# Patient Record
Sex: Female | Born: 1965 | Race: White | Hispanic: No | Smoking: Current some day smoker
Health system: Southern US, Community
[De-identification: ages and names within clinical notes are randomized; demographics above are authoritative.]

## PROBLEM LIST (undated history)

## (undated) DIAGNOSIS — T148XXA Other injury of unspecified body region, initial encounter: Secondary | ICD-10-CM

## (undated) DIAGNOSIS — M549 Dorsalgia, unspecified: Secondary | ICD-10-CM

## (undated) DIAGNOSIS — M1990 Unspecified osteoarthritis, unspecified site: Secondary | ICD-10-CM

## (undated) DIAGNOSIS — F419 Anxiety disorder, unspecified: Secondary | ICD-10-CM

## (undated) DIAGNOSIS — F988 Other specified behavioral and emotional disorders with onset usually occurring in childhood and adolescence: Secondary | ICD-10-CM

## (undated) HISTORY — PX: KNEE SURGERY: SHX244

---

## 2004-02-09 ENCOUNTER — Emergency Department (HOSPITAL_COMMUNITY): Admission: EM | Admit: 2004-02-09 | Discharge: 2004-02-09 | Payer: Self-pay | Admitting: Emergency Medicine

## 2004-04-20 ENCOUNTER — Emergency Department (HOSPITAL_COMMUNITY): Admission: AC | Admit: 2004-04-20 | Discharge: 2004-04-21 | Payer: Self-pay

## 2005-10-29 ENCOUNTER — Emergency Department (HOSPITAL_COMMUNITY): Admission: EM | Admit: 2005-10-29 | Discharge: 2005-10-29 | Payer: Self-pay | Admitting: Family Medicine

## 2007-06-06 ENCOUNTER — Emergency Department (HOSPITAL_COMMUNITY): Admission: EM | Admit: 2007-06-06 | Discharge: 2007-06-06 | Payer: Self-pay | Admitting: Emergency Medicine

## 2007-06-20 ENCOUNTER — Emergency Department (HOSPITAL_COMMUNITY): Admission: EM | Admit: 2007-06-20 | Discharge: 2007-06-20 | Payer: Self-pay | Admitting: Emergency Medicine

## 2007-07-01 ENCOUNTER — Emergency Department (HOSPITAL_COMMUNITY): Admission: EM | Admit: 2007-07-01 | Discharge: 2007-07-01 | Payer: Self-pay | Admitting: Emergency Medicine

## 2007-08-04 ENCOUNTER — Encounter: Admission: RE | Admit: 2007-08-04 | Discharge: 2007-08-04 | Payer: Self-pay | Admitting: Sports Medicine

## 2007-08-18 ENCOUNTER — Emergency Department (HOSPITAL_COMMUNITY): Admission: EM | Admit: 2007-08-18 | Discharge: 2007-08-18 | Payer: Self-pay | Admitting: Family Medicine

## 2007-09-07 ENCOUNTER — Emergency Department (HOSPITAL_COMMUNITY): Admission: EM | Admit: 2007-09-07 | Discharge: 2007-09-07 | Payer: Self-pay | Admitting: Emergency Medicine

## 2007-09-08 ENCOUNTER — Emergency Department (HOSPITAL_COMMUNITY): Admission: EM | Admit: 2007-09-08 | Discharge: 2007-09-08 | Payer: Self-pay | Admitting: Family Medicine

## 2007-09-30 ENCOUNTER — Emergency Department (HOSPITAL_COMMUNITY): Admission: EM | Admit: 2007-09-30 | Discharge: 2007-09-30 | Payer: Self-pay | Admitting: Emergency Medicine

## 2007-11-05 ENCOUNTER — Emergency Department (HOSPITAL_COMMUNITY): Admission: EM | Admit: 2007-11-05 | Discharge: 2007-11-05 | Payer: Self-pay | Admitting: Emergency Medicine

## 2007-11-14 ENCOUNTER — Emergency Department (HOSPITAL_COMMUNITY): Admission: EM | Admit: 2007-11-14 | Discharge: 2007-11-14 | Payer: Self-pay | Admitting: Emergency Medicine

## 2007-11-20 ENCOUNTER — Emergency Department (HOSPITAL_COMMUNITY): Admission: EM | Admit: 2007-11-20 | Discharge: 2007-11-21 | Payer: Self-pay | Admitting: Emergency Medicine

## 2007-12-31 ENCOUNTER — Emergency Department (HOSPITAL_COMMUNITY): Admission: EM | Admit: 2007-12-31 | Discharge: 2007-12-31 | Payer: Self-pay | Admitting: Emergency Medicine

## 2008-04-15 ENCOUNTER — Emergency Department (HOSPITAL_COMMUNITY): Admission: EM | Admit: 2008-04-15 | Discharge: 2008-04-15 | Payer: Self-pay | Admitting: Emergency Medicine

## 2009-06-01 ENCOUNTER — Emergency Department (HOSPITAL_COMMUNITY): Admission: EM | Admit: 2009-06-01 | Discharge: 2009-06-01 | Payer: Self-pay | Admitting: Emergency Medicine

## 2009-07-06 ENCOUNTER — Emergency Department (HOSPITAL_COMMUNITY): Admission: EM | Admit: 2009-07-06 | Discharge: 2009-07-06 | Payer: Self-pay | Admitting: Emergency Medicine

## 2009-07-18 ENCOUNTER — Encounter
Admission: RE | Admit: 2009-07-18 | Discharge: 2009-07-21 | Payer: Self-pay | Admitting: Physical Medicine & Rehabilitation

## 2009-07-21 ENCOUNTER — Ambulatory Visit: Payer: Self-pay | Admitting: Physical Medicine & Rehabilitation

## 2009-07-25 ENCOUNTER — Emergency Department (HOSPITAL_COMMUNITY): Admission: EM | Admit: 2009-07-25 | Discharge: 2009-07-25 | Payer: Self-pay | Admitting: Emergency Medicine

## 2009-08-23 ENCOUNTER — Emergency Department (HOSPITAL_COMMUNITY): Admission: EM | Admit: 2009-08-23 | Discharge: 2009-08-23 | Payer: Self-pay | Admitting: Emergency Medicine

## 2009-09-12 ENCOUNTER — Emergency Department (HOSPITAL_COMMUNITY): Admission: EM | Admit: 2009-09-12 | Discharge: 2009-09-12 | Payer: Self-pay | Admitting: Emergency Medicine

## 2009-09-29 ENCOUNTER — Emergency Department (HOSPITAL_COMMUNITY): Admission: EM | Admit: 2009-09-29 | Discharge: 2009-09-29 | Payer: Self-pay | Admitting: Emergency Medicine

## 2009-11-18 ENCOUNTER — Emergency Department (HOSPITAL_COMMUNITY): Admission: EM | Admit: 2009-11-18 | Discharge: 2009-11-19 | Payer: Self-pay | Admitting: Emergency Medicine

## 2009-12-01 ENCOUNTER — Emergency Department (HOSPITAL_COMMUNITY): Admission: EM | Admit: 2009-12-01 | Discharge: 2009-12-01 | Payer: Self-pay | Admitting: Emergency Medicine

## 2010-01-31 ENCOUNTER — Encounter: Admission: RE | Admit: 2010-01-31 | Discharge: 2010-01-31 | Payer: Self-pay | Admitting: Geriatric Medicine

## 2010-02-17 ENCOUNTER — Emergency Department (HOSPITAL_COMMUNITY)
Admission: EM | Admit: 2010-02-17 | Discharge: 2010-02-17 | Payer: Self-pay | Source: Home / Self Care | Admitting: Family Medicine

## 2010-03-03 ENCOUNTER — Emergency Department (HOSPITAL_COMMUNITY)
Admission: EM | Admit: 2010-03-03 | Discharge: 2010-03-04 | Payer: Self-pay | Source: Home / Self Care | Admitting: Emergency Medicine

## 2010-04-01 ENCOUNTER — Encounter: Payer: Self-pay | Admitting: Family Medicine

## 2010-04-01 ENCOUNTER — Encounter: Payer: Self-pay | Admitting: Neurology

## 2010-05-27 LAB — URINALYSIS, ROUTINE W REFLEX MICROSCOPIC
Bilirubin Urine: NEGATIVE
Glucose, UA: NEGATIVE mg/dL
Glucose, UA: NEGATIVE mg/dL
Hgb urine dipstick: NEGATIVE
Ketones, ur: 15 mg/dL — AB
Ketones, ur: NEGATIVE mg/dL
Nitrite: NEGATIVE
Nitrite: NEGATIVE
Protein, ur: NEGATIVE mg/dL
Protein, ur: 30 mg/dL — AB
Specific Gravity, Urine: 1.031 — ABNORMAL HIGH (ref 1.005–1.030)
Specific Gravity, Urine: 1.008 (ref 1.005–1.030)
Urobilinogen, UA: 0.2 mg/dL (ref 0.0–1.0)
Urobilinogen, UA: 0.2 mg/dL (ref 0.0–1.0)
pH: 5.5 (ref 5.0–8.0)
pH: 7 (ref 5.0–8.0)

## 2010-05-27 LAB — POCT I-STAT, CHEM 8
Chloride: 106 mEq/L (ref 96–112)
Creatinine, Ser: 0.8 mg/dL (ref 0.4–1.2)
Glucose, Bld: 91 mg/dL (ref 70–99)
Potassium: 3.7 mEq/L (ref 3.5–5.1)

## 2010-05-27 LAB — POCT CARDIAC MARKERS
CKMB, poc: <1 ng/mL — ABNORMAL LOW (ref 1.0–8.0)
CKMB, poc: <1 ng/mL — ABNORMAL LOW (ref 1.0–8.0)
CKMB, poc: <1 ng/mL — ABNORMAL LOW (ref 1.0–8.0)
Myoglobin, poc: 30.8 ng/mL (ref 12–200)
Myoglobin, poc: 51.7 ng/mL (ref 12–200)
Myoglobin, poc: 36.7 ng/mL (ref 12–200)
Troponin i, poc: <0.05 ng/mL (ref 0.00–0.09)
Troponin i, poc: <0.05 ng/mL (ref 0.00–0.09)
Troponin i, poc: <0.05 ng/mL (ref 0.00–0.09)

## 2010-05-27 LAB — DIFFERENTIAL
Basophils Absolute: 0 10*3/uL (ref 0.0–0.1)
Basophils Relative: 0 % (ref 0–1)
Eosinophils Absolute: 0.2 10*3/uL (ref 0.0–0.7)
Eosinophils Relative: 4 % (ref 0–5)
Lymphocytes Relative: 17 % (ref 12–46)
Lymphocytes Relative: 12 % (ref 12–46)
Lymphs Abs: 0.8 10*3/uL (ref 0.7–4.0)
Lymphs Abs: 1.4 10*3/uL (ref 0.7–4.0)
Monocytes Absolute: 0.1 10*3/uL (ref 0.1–1.0)
Monocytes Relative: 3 % (ref 3–12)
Monocytes Relative: 3 % (ref 3–12)
Neutro Abs: 3.8 10*3/uL (ref 1.7–7.7)
Neutro Abs: 9.4 10*3/uL — ABNORMAL HIGH (ref 1.7–7.7)
Neutrophils Relative %: 76 % (ref 43–77)
Neutrophils Relative %: 84 % — ABNORMAL HIGH (ref 43–77)

## 2010-05-27 LAB — CBC
HCT: 42 % (ref 36.0–46.0)
Hemoglobin: 14.4 g/dL (ref 12.0–15.0)
MCH: 34.8 pg — ABNORMAL HIGH (ref 26.0–34.0)
MCHC: 34.2 g/dL (ref 30.0–36.0)
MCV: 101.9 fL — ABNORMAL HIGH (ref 78.0–100.0)
Platelets: 303 10*3/uL (ref 150–400)
RBC: 4.12 MIL/uL (ref 3.87–5.11)
RBC: 3.45 MIL/uL — ABNORMAL LOW (ref 3.87–5.11)
RDW: 14.4 % (ref 11.5–15.5)
WBC: 4.9 10*3/uL (ref 4.0–10.5)
WBC: 11.2 10*3/uL — ABNORMAL HIGH (ref 4.0–10.5)

## 2010-05-27 LAB — BASIC METABOLIC PANEL
BUN: 8 mg/dL (ref 6–23)
CO2: 27 mEq/L (ref 19–32)
Calcium: 8.6 mg/dL (ref 8.4–10.5)
Chloride: 109 mEq/L (ref 96–112)
Creatinine, Ser: 0.73 mg/dL (ref 0.4–1.2)
GFR calc Af Amer: >60 mL/min (ref >60)
GFR calc non Af Amer: >60 mL/min (ref >60)
Glucose, Bld: 93 mg/dL (ref 70–99)
Potassium: 3.4 mEq/L — ABNORMAL LOW (ref 3.5–5.1)
Sodium: 143 mEq/L (ref 135–145)

## 2010-05-27 LAB — D-DIMER, QUANTITATIVE: D-Dimer, Quant: 0.52 ug/mL-FEU — ABNORMAL HIGH (ref 0.00–0.48)

## 2010-05-27 LAB — URINE MICROSCOPIC-ADD ON

## 2010-05-27 LAB — POCT PREGNANCY, URINE
Preg Test, Ur: NEGATIVE
Preg Test, Ur: NEGATIVE

## 2010-05-27 LAB — RAPID URINE DRUG SCREEN, HOSP PERFORMED
Amphetamines: NOT DETECTED
Barbiturates: NOT DETECTED
Tetrahydrocannabinol: NOT DETECTED

## 2010-05-28 LAB — CBC
HCT: 37.4 % (ref 36.0–46.0)
Hemoglobin: 12.7 g/dL (ref 12.0–15.0)
RBC: 3.66 MIL/uL — ABNORMAL LOW (ref 3.87–5.11)
RDW: 13.5 % (ref 11.5–15.5)

## 2010-05-28 LAB — DIFFERENTIAL
Basophils Absolute: 0 10*3/uL (ref 0.0–0.1)
Basophils Relative: 0 % (ref 0–1)
Eosinophils Absolute: 0.2 10*3/uL (ref 0.0–0.7)
Neutro Abs: 2.7 10*3/uL (ref 1.7–7.7)
Neutrophils Relative %: 61 % (ref 43–77)

## 2010-05-28 LAB — COMPREHENSIVE METABOLIC PANEL
ALT: 14 U/L (ref 0–35)
Alkaline Phosphatase: 43 U/L (ref 39–117)
BUN: 10 mg/dL (ref 6–23)
CO2: 28 mEq/L (ref 19–32)
Chloride: 107 mEq/L (ref 96–112)
GFR calc non Af Amer: >60 mL/min (ref >60)
Glucose, Bld: 81 mg/dL (ref 70–99)
Potassium: 3.3 mEq/L — ABNORMAL LOW (ref 3.5–5.1)
Sodium: 141 mEq/L (ref 135–145)
Total Bilirubin: 0.5 mg/dL (ref 0.3–1.2)
Total Protein: 6.4 g/dL (ref 6.0–8.3)

## 2010-05-28 LAB — URINALYSIS, ROUTINE W REFLEX MICROSCOPIC
Bilirubin Urine: NEGATIVE
Nitrite: NEGATIVE
Protein, ur: NEGATIVE mg/dL
Specific Gravity, Urine: 1.01 (ref 1.005–1.030)
Urobilinogen, UA: 0.2 mg/dL (ref 0.0–1.0)

## 2010-05-28 LAB — URINE CULTURE

## 2010-05-28 LAB — LIPASE, BLOOD: Lipase: 19 U/L (ref 11–59)

## 2010-05-28 LAB — POCT PREGNANCY, URINE: Preg Test, Ur: NEGATIVE

## 2010-05-31 ENCOUNTER — Emergency Department (HOSPITAL_COMMUNITY)
Admission: EM | Admit: 2010-05-31 | Discharge: 2010-05-31 | Disposition: A | Discharge status: Home / Self Care | Payer: Medicaid Other | Attending: Emergency Medicine | Admitting: Emergency Medicine

## 2010-05-31 ENCOUNTER — Emergency Department (HOSPITAL_COMMUNITY)
Admission: EM | Admit: 2010-05-31 | Discharge: 2010-06-01 | Disposition: A | Discharge status: Home / Self Care | Payer: Medicaid Other | Attending: Emergency Medicine | Admitting: Emergency Medicine

## 2010-05-31 DIAGNOSIS — M549 Dorsalgia, unspecified: Secondary | ICD-10-CM | POA: Insufficient documentation

## 2010-05-31 DIAGNOSIS — F988 Other specified behavioral and emotional disorders with onset usually occurring in childhood and adolescence: Secondary | ICD-10-CM | POA: Insufficient documentation

## 2010-05-31 DIAGNOSIS — G8929 Other chronic pain: Secondary | ICD-10-CM | POA: Insufficient documentation

## 2010-05-31 DIAGNOSIS — K120 Recurrent oral aphthae: Secondary | ICD-10-CM | POA: Insufficient documentation

## 2010-05-31 DIAGNOSIS — F411 Generalized anxiety disorder: Secondary | ICD-10-CM | POA: Insufficient documentation

## 2010-05-31 DIAGNOSIS — M542 Cervicalgia: Secondary | ICD-10-CM | POA: Insufficient documentation

## 2010-06-04 ENCOUNTER — Emergency Department (HOSPITAL_COMMUNITY)
Admission: EM | Admit: 2010-06-04 | Discharge: 2010-06-04 | Disposition: A | Discharge status: Home / Self Care | Payer: Medicaid Other | Attending: Emergency Medicine | Admitting: Emergency Medicine

## 2010-06-04 DIAGNOSIS — F988 Other specified behavioral and emotional disorders with onset usually occurring in childhood and adolescence: Secondary | ICD-10-CM | POA: Insufficient documentation

## 2010-06-04 DIAGNOSIS — F411 Generalized anxiety disorder: Secondary | ICD-10-CM | POA: Insufficient documentation

## 2010-06-04 DIAGNOSIS — J029 Acute pharyngitis, unspecified: Secondary | ICD-10-CM | POA: Insufficient documentation

## 2010-07-01 ENCOUNTER — Emergency Department (HOSPITAL_COMMUNITY)
Admission: EM | Admit: 2010-07-01 | Discharge: 2010-07-01 | Disposition: A | Discharge status: Home / Self Care | Payer: Medicaid Other | Attending: Emergency Medicine | Admitting: Emergency Medicine

## 2010-07-01 DIAGNOSIS — R300 Dysuria: Secondary | ICD-10-CM | POA: Insufficient documentation

## 2010-07-01 DIAGNOSIS — F411 Generalized anxiety disorder: Secondary | ICD-10-CM | POA: Insufficient documentation

## 2010-07-01 DIAGNOSIS — N390 Urinary tract infection, site not specified: Secondary | ICD-10-CM | POA: Insufficient documentation

## 2010-07-01 DIAGNOSIS — F988 Other specified behavioral and emotional disorders with onset usually occurring in childhood and adolescence: Secondary | ICD-10-CM | POA: Insufficient documentation

## 2010-07-01 LAB — URINALYSIS, ROUTINE W REFLEX MICROSCOPIC
Bilirubin Urine: NEGATIVE
Ketones, ur: NEGATIVE mg/dL
Nitrite: NEGATIVE
Urobilinogen, UA: 0.2 mg/dL (ref 0.0–1.0)

## 2010-07-01 LAB — URINE MICROSCOPIC-ADD ON

## 2010-07-01 LAB — POCT PREGNANCY, URINE: Preg Test, Ur: NEGATIVE

## 2010-07-31 ENCOUNTER — Emergency Department (HOSPITAL_COMMUNITY)
Admission: EM | Admit: 2010-07-31 | Discharge: 2010-07-31 | Discharge status: Against Medical Advice | Payer: Medicaid Other | Attending: Emergency Medicine | Admitting: Emergency Medicine

## 2010-08-22 ENCOUNTER — Emergency Department (HOSPITAL_COMMUNITY)
Admission: EM | Admit: 2010-08-22 | Discharge: 2010-08-22 | Disposition: A | Discharge status: Home / Self Care | Payer: Medicaid Other | Attending: Emergency Medicine | Admitting: Emergency Medicine

## 2010-08-22 DIAGNOSIS — M545 Low back pain, unspecified: Secondary | ICD-10-CM | POA: Insufficient documentation

## 2010-08-22 DIAGNOSIS — F988 Other specified behavioral and emotional disorders with onset usually occurring in childhood and adolescence: Secondary | ICD-10-CM | POA: Insufficient documentation

## 2010-08-22 DIAGNOSIS — K140 Glossitis: Secondary | ICD-10-CM | POA: Insufficient documentation

## 2010-08-22 DIAGNOSIS — F411 Generalized anxiety disorder: Secondary | ICD-10-CM | POA: Insufficient documentation

## 2010-08-22 DIAGNOSIS — R319 Hematuria, unspecified: Secondary | ICD-10-CM | POA: Insufficient documentation

## 2010-08-22 DIAGNOSIS — N390 Urinary tract infection, site not specified: Secondary | ICD-10-CM | POA: Insufficient documentation

## 2010-08-22 LAB — URINALYSIS, ROUTINE W REFLEX MICROSCOPIC
Bilirubin Urine: NEGATIVE
Ketones, ur: 15 mg/dL — AB
Nitrite: NEGATIVE
Specific Gravity, Urine: 1.028 (ref 1.005–1.030)
Urobilinogen, UA: 0.2 mg/dL (ref 0.0–1.0)
pH: 5 (ref 5.0–8.0)

## 2010-08-22 LAB — URINE MICROSCOPIC-ADD ON

## 2010-08-22 LAB — POCT PREGNANCY, URINE: Preg Test, Ur: NEGATIVE

## 2010-09-16 ENCOUNTER — Inpatient Hospital Stay (INDEPENDENT_AMBULATORY_CARE_PROVIDER_SITE_OTHER)
Admission: RE | Admit: 2010-09-16 | Discharge: 2010-09-16 | Disposition: A | Discharge status: Home / Self Care | Payer: Medicaid Other | Source: Ambulatory Visit | Attending: Family Medicine | Admitting: Family Medicine

## 2010-09-16 DIAGNOSIS — J069 Acute upper respiratory infection, unspecified: Secondary | ICD-10-CM

## 2010-09-16 DIAGNOSIS — N390 Urinary tract infection, site not specified: Secondary | ICD-10-CM

## 2010-09-16 LAB — POCT URINALYSIS DIP (DEVICE)
Bilirubin Urine: NEGATIVE
Ketones, ur: 15 mg/dL — AB
Protein, ur: 30 mg/dL — AB
pH: 5.5 (ref 5.0–8.0)

## 2010-09-19 LAB — URINE CULTURE
Colony Count: 75000
Culture  Setup Time: 201207081718

## 2010-10-17 ENCOUNTER — Emergency Department (HOSPITAL_COMMUNITY)
Admission: EM | Admit: 2010-10-17 | Discharge: 2010-10-17 | Disposition: A | Discharge status: Home / Self Care | Payer: Medicaid Other | Attending: Emergency Medicine | Admitting: Emergency Medicine

## 2010-10-17 DIAGNOSIS — Y92009 Unspecified place in unspecified non-institutional (private) residence as the place of occurrence of the external cause: Secondary | ICD-10-CM | POA: Insufficient documentation

## 2010-10-17 DIAGNOSIS — G8929 Other chronic pain: Secondary | ICD-10-CM | POA: Insufficient documentation

## 2010-10-17 DIAGNOSIS — F988 Other specified behavioral and emotional disorders with onset usually occurring in childhood and adolescence: Secondary | ICD-10-CM | POA: Insufficient documentation

## 2010-10-17 DIAGNOSIS — M25569 Pain in unspecified knee: Secondary | ICD-10-CM | POA: Insufficient documentation

## 2010-10-17 DIAGNOSIS — W010XXA Fall on same level from slipping, tripping and stumbling without subsequent striking against object, initial encounter: Secondary | ICD-10-CM | POA: Insufficient documentation

## 2010-10-20 ENCOUNTER — Emergency Department (HOSPITAL_COMMUNITY)
Admission: EM | Admit: 2010-10-20 | Discharge: 2010-10-20 | Disposition: A | Discharge status: Home / Self Care | Payer: Medicaid Other | Attending: Emergency Medicine | Admitting: Emergency Medicine

## 2010-10-20 DIAGNOSIS — W1809XA Striking against other object with subsequent fall, initial encounter: Secondary | ICD-10-CM | POA: Insufficient documentation

## 2010-10-20 DIAGNOSIS — M25569 Pain in unspecified knee: Secondary | ICD-10-CM | POA: Insufficient documentation

## 2010-10-20 DIAGNOSIS — S99919A Unspecified injury of unspecified ankle, initial encounter: Secondary | ICD-10-CM | POA: Insufficient documentation

## 2010-10-20 DIAGNOSIS — S8990XA Unspecified injury of unspecified lower leg, initial encounter: Secondary | ICD-10-CM | POA: Insufficient documentation

## 2010-11-02 ENCOUNTER — Emergency Department (HOSPITAL_COMMUNITY)
Admission: EM | Admit: 2010-11-02 | Discharge: 2010-11-02 | Discharge status: Against Medical Advice | Payer: Medicaid Other | Attending: Emergency Medicine | Admitting: Emergency Medicine

## 2010-11-02 DIAGNOSIS — S8000XA Contusion of unspecified knee, initial encounter: Secondary | ICD-10-CM | POA: Insufficient documentation

## 2010-11-02 DIAGNOSIS — W19XXXA Unspecified fall, initial encounter: Secondary | ICD-10-CM | POA: Insufficient documentation

## 2010-11-15 ENCOUNTER — Emergency Department (HOSPITAL_COMMUNITY): Payer: Medicaid Other

## 2010-11-15 ENCOUNTER — Emergency Department (HOSPITAL_COMMUNITY)
Admission: EM | Admit: 2010-11-15 | Discharge: 2010-11-15 | Disposition: A | Discharge status: Home / Self Care | Payer: Medicaid Other | Attending: Emergency Medicine | Admitting: Emergency Medicine

## 2010-11-15 DIAGNOSIS — S8000XA Contusion of unspecified knee, initial encounter: Secondary | ICD-10-CM | POA: Insufficient documentation

## 2010-11-15 DIAGNOSIS — F411 Generalized anxiety disorder: Secondary | ICD-10-CM | POA: Insufficient documentation

## 2010-11-15 DIAGNOSIS — Y92009 Unspecified place in unspecified non-institutional (private) residence as the place of occurrence of the external cause: Secondary | ICD-10-CM | POA: Insufficient documentation

## 2010-11-15 DIAGNOSIS — F988 Other specified behavioral and emotional disorders with onset usually occurring in childhood and adolescence: Secondary | ICD-10-CM | POA: Insufficient documentation

## 2010-11-15 DIAGNOSIS — W108XXA Fall (on) (from) other stairs and steps, initial encounter: Secondary | ICD-10-CM | POA: Insufficient documentation

## 2010-12-08 ENCOUNTER — Emergency Department (HOSPITAL_COMMUNITY)
Admission: EM | Admit: 2010-12-08 | Discharge: 2010-12-08 | Disposition: A | Discharge status: Home / Self Care | Payer: Medicaid Other | Attending: Emergency Medicine | Admitting: Emergency Medicine

## 2010-12-08 DIAGNOSIS — M25569 Pain in unspecified knee: Secondary | ICD-10-CM | POA: Insufficient documentation

## 2010-12-15 ENCOUNTER — Emergency Department (HOSPITAL_COMMUNITY)
Admission: EM | Admit: 2010-12-15 | Discharge: 2010-12-15 | Disposition: A | Discharge status: Home / Self Care | Payer: Medicaid Other | Attending: Emergency Medicine | Admitting: Emergency Medicine

## 2010-12-15 ENCOUNTER — Emergency Department (HOSPITAL_COMMUNITY): Payer: Medicaid Other

## 2010-12-15 DIAGNOSIS — M79609 Pain in unspecified limb: Secondary | ICD-10-CM | POA: Insufficient documentation

## 2010-12-15 DIAGNOSIS — G8929 Other chronic pain: Secondary | ICD-10-CM | POA: Insufficient documentation

## 2010-12-15 DIAGNOSIS — Z79899 Other long term (current) drug therapy: Secondary | ICD-10-CM | POA: Insufficient documentation

## 2010-12-15 DIAGNOSIS — F988 Other specified behavioral and emotional disorders with onset usually occurring in childhood and adolescence: Secondary | ICD-10-CM | POA: Insufficient documentation

## 2010-12-15 DIAGNOSIS — M545 Low back pain, unspecified: Secondary | ICD-10-CM | POA: Insufficient documentation

## 2010-12-15 DIAGNOSIS — M1990 Unspecified osteoarthritis, unspecified site: Secondary | ICD-10-CM | POA: Insufficient documentation

## 2010-12-15 DIAGNOSIS — F411 Generalized anxiety disorder: Secondary | ICD-10-CM | POA: Insufficient documentation

## 2010-12-15 DIAGNOSIS — R197 Diarrhea, unspecified: Secondary | ICD-10-CM | POA: Insufficient documentation

## 2010-12-15 DIAGNOSIS — M25569 Pain in unspecified knee: Secondary | ICD-10-CM | POA: Insufficient documentation

## 2010-12-15 LAB — DIFFERENTIAL
Basophils Absolute: 0 10*3/uL (ref 0.0–0.1)
Basophils Relative: 1 % (ref 0–1)
Eosinophils Absolute: 0.2 10*3/uL (ref 0.0–0.7)
Eosinophils Relative: 4 % (ref 0–5)
Monocytes Absolute: 0.5 10*3/uL (ref 0.1–1.0)

## 2010-12-15 LAB — COMPREHENSIVE METABOLIC PANEL
Albumin: 4.2 g/dL (ref 3.5–5.2)
Alkaline Phosphatase: 48 U/L (ref 39–117)
BUN: 11 mg/dL (ref 6–23)
Potassium: 3.6 mEq/L (ref 3.5–5.1)
Sodium: 141 mEq/L (ref 135–145)
Total Protein: 7.6 g/dL (ref 6.0–8.3)

## 2010-12-15 LAB — URINALYSIS, ROUTINE W REFLEX MICROSCOPIC
Bilirubin Urine: NEGATIVE
Nitrite: NEGATIVE
Specific Gravity, Urine: 1.014 (ref 1.005–1.030)
pH: 6 (ref 5.0–8.0)

## 2010-12-15 LAB — CBC
HCT: 40.1 % (ref 36.0–46.0)
MCHC: 34.2 g/dL (ref 30.0–36.0)
RDW: 14.6 % (ref 11.5–15.5)

## 2010-12-15 LAB — URINE MICROSCOPIC-ADD ON

## 2010-12-15 LAB — PREGNANCY, URINE: Preg Test, Ur: NEGATIVE

## 2010-12-18 IMAGING — CT CT ANGIO CHEST
2 of 6 series · 19 of 36 positions shown · IV contrast (APPLIED)
Comparison: None

CLINICAL DATA: Chest pain

CT ANGIOGRAPHY CHEST WITH CONTRAST
TECHNIQUE: Multidetector CT imaging of the chest was performed
using the standard protocol during bolus administration of
intravenous contrast.  Multiplanar CT image reconstructions
including MIPs were obtained to evaluate the vascular anatomy.
Contrast:  80 ml of omni 300

[Series 6: pe thins @ 1mm · axial · 0.80mm/px · z∈[-225,+7]mm · 18 of 258 slices shown]
[im 13/258  lung]
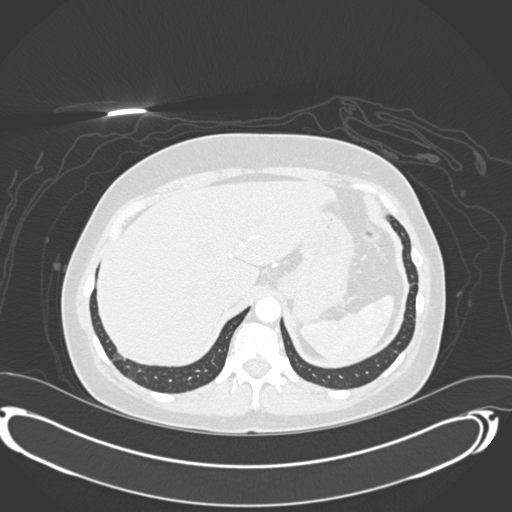
[im 26/258  mediastinal]
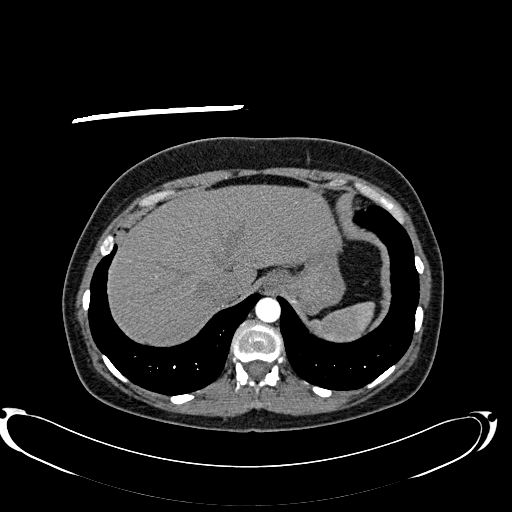
[im 39/258  lung]
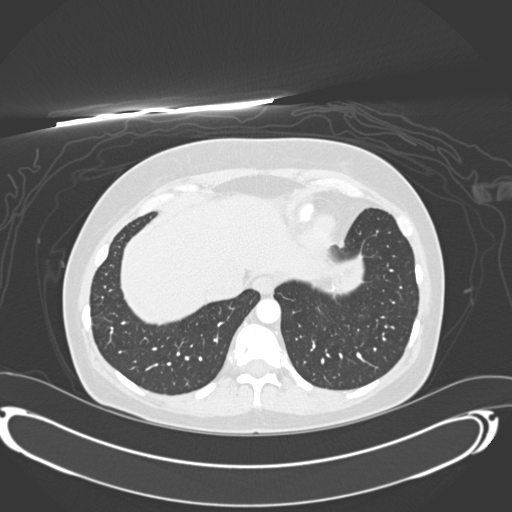
[im 52/258  mediastinal]
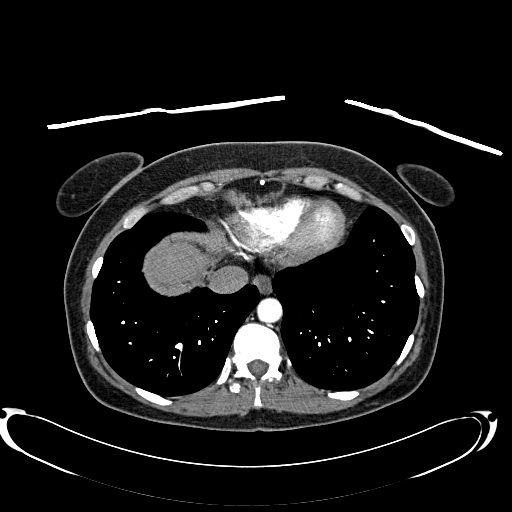
[im 65/258  lung]
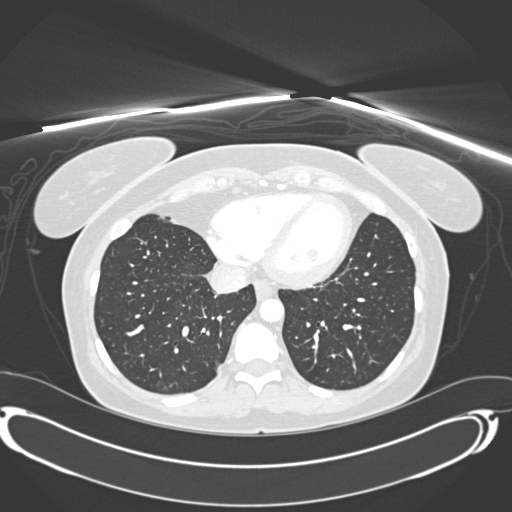
[im 78/258  mediastinal]
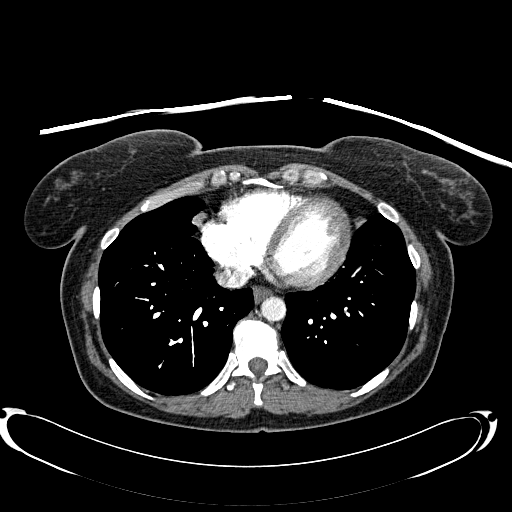
[im 90/258  lung]
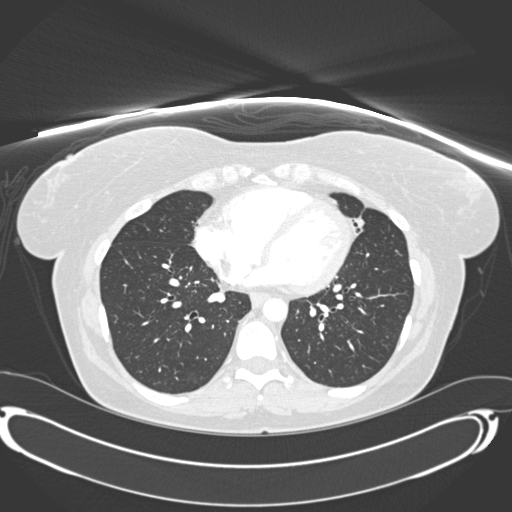
[im 103/258  mediastinal]
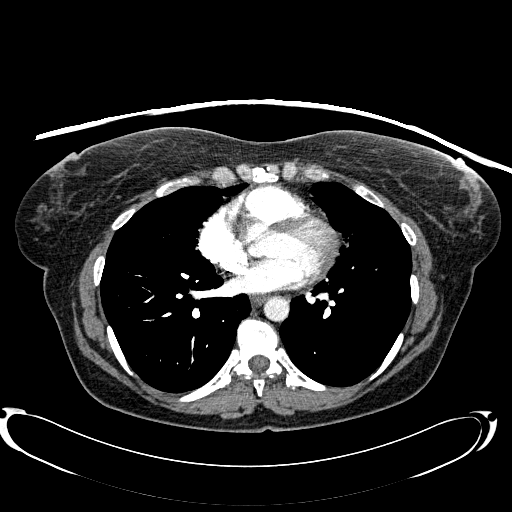
[im 116/258  lung]
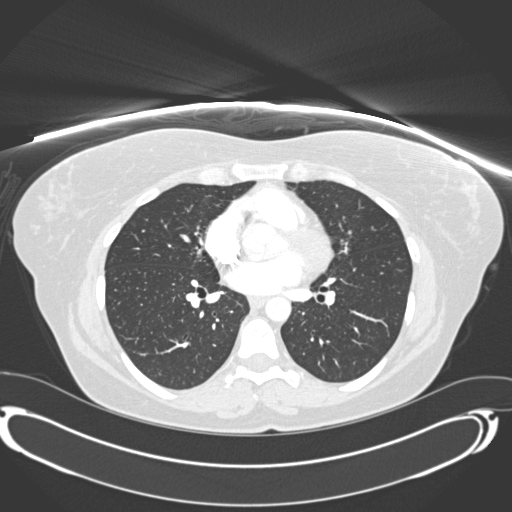
[im 142/258  mediastinal]
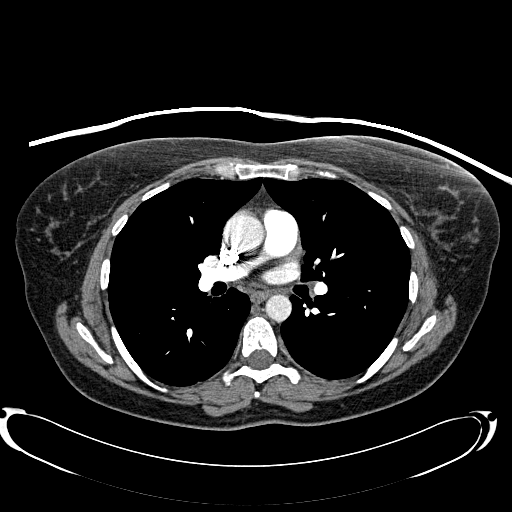
[im 155/258  lung]
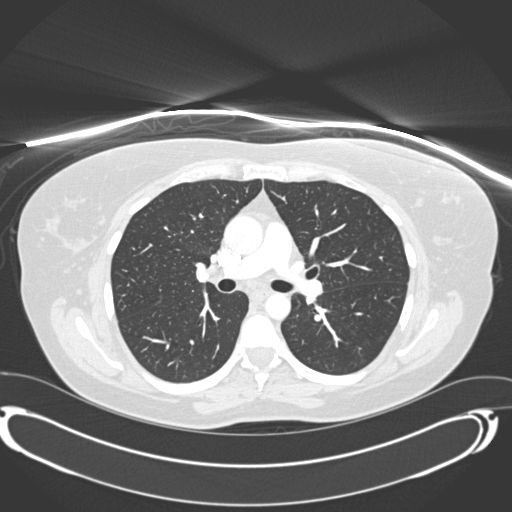
[im 168/258  mediastinal]
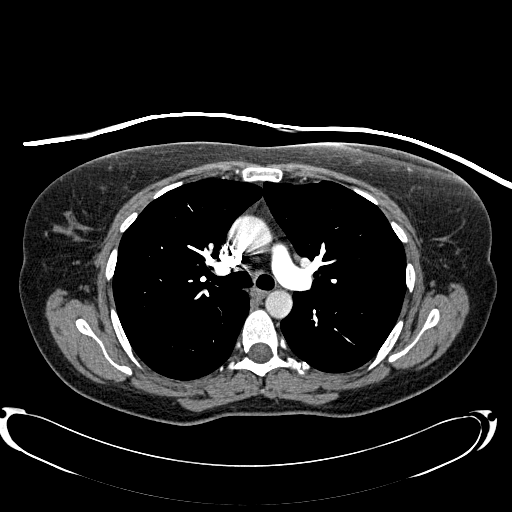
[im 180/258  lung]
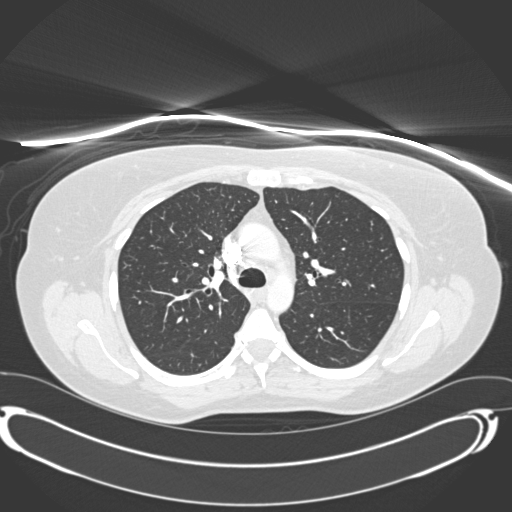
[im 193/258  mediastinal]
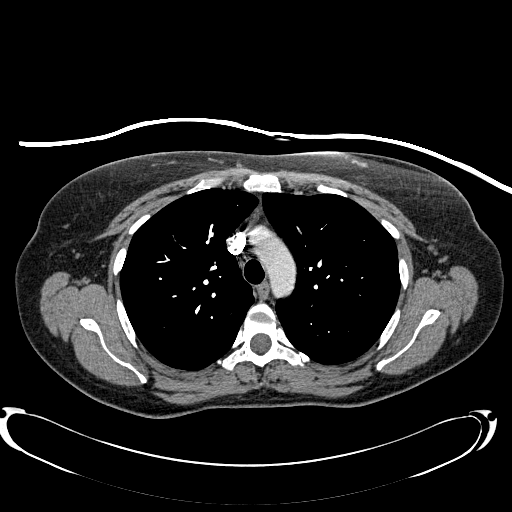
[im 206/258  lung]
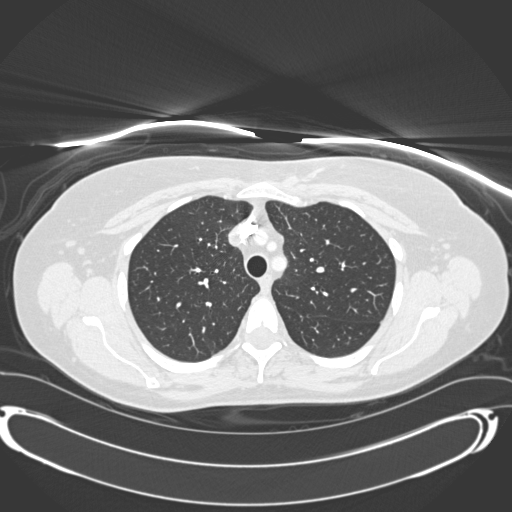
[im 219/258  mediastinal]
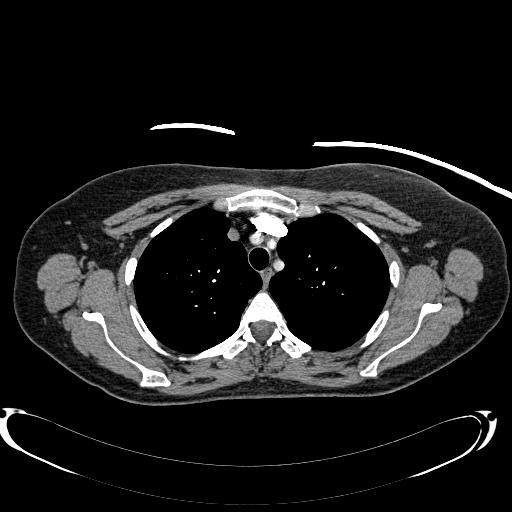
[im 232/258  lung]
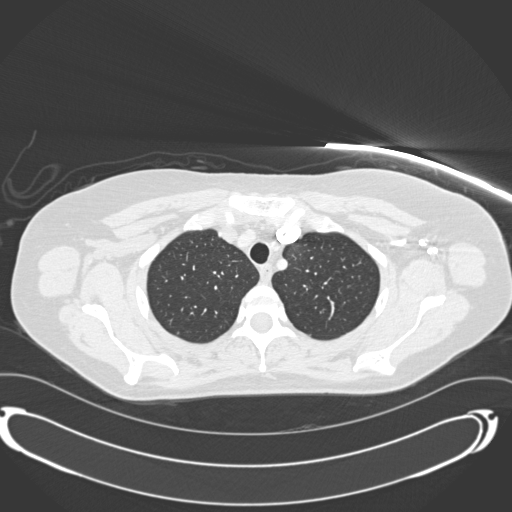
[im 245/258  mediastinal]
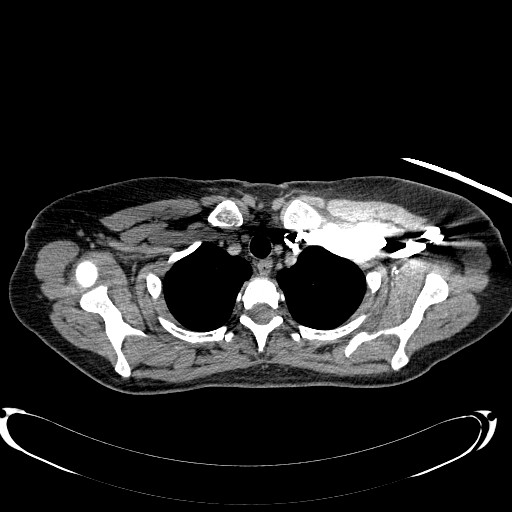

[Series 602: coronal mpr · coronal · 0.80mm/px · 1 of 119 slices shown]
[im 60/119  mediastinal]
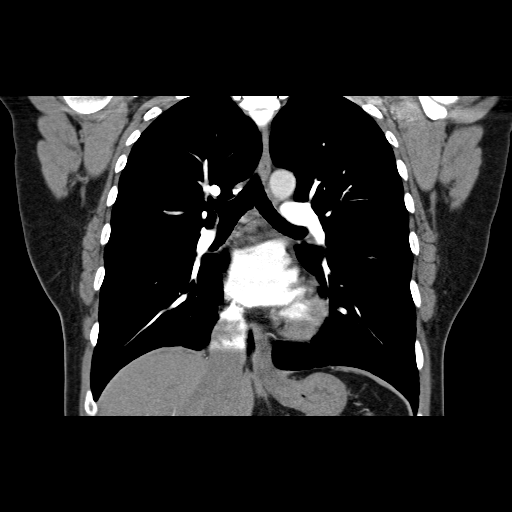

[19 of 36 positions shown; findings below may reference images not displayed]

FINDINGS: No enlarged axillary or supraclavicular lymph nodes.

There is no enlarged mediastinal or hilar lymph nodes.

No pericardial or pleural effusions.

The pulmonary arteries are patent.

There is no evidence for acute pulmonary embolus.

The lungs appear clear.

There is mild bronchiectasis involving the lingular portion of the
left lung.

Review of the visualized osseous structures shows no acute bony
abnormalities.

Review of the MIP images confirms the above findings.
IMPRESSION: 1.  No evidence for acute pulmonary embolus.
2.  Mild lingular bronchiectasis.

## 2011-01-27 ENCOUNTER — Emergency Department (HOSPITAL_COMMUNITY)
Admission: EM | Admit: 2011-01-27 | Discharge: 2011-01-27 | Disposition: A | Discharge status: Home / Self Care | Payer: Medicaid Other | Attending: Emergency Medicine | Admitting: Emergency Medicine

## 2011-01-27 ENCOUNTER — Emergency Department (HOSPITAL_COMMUNITY): Payer: Medicaid Other

## 2011-01-27 DIAGNOSIS — M549 Dorsalgia, unspecified: Secondary | ICD-10-CM | POA: Insufficient documentation

## 2011-01-27 DIAGNOSIS — F17200 Nicotine dependence, unspecified, uncomplicated: Secondary | ICD-10-CM | POA: Insufficient documentation

## 2011-01-27 DIAGNOSIS — M25569 Pain in unspecified knee: Secondary | ICD-10-CM | POA: Insufficient documentation

## 2011-01-27 DIAGNOSIS — G8929 Other chronic pain: Secondary | ICD-10-CM | POA: Insufficient documentation

## 2011-01-27 HISTORY — DX: Dorsalgia, unspecified: M54.9

## 2011-01-27 HISTORY — DX: Unspecified osteoarthritis, unspecified site: M19.90

## 2011-01-27 MED ORDER — TRAMADOL HCL 50 MG PO TABS
50.0000 mg | ORAL_TABLET | Freq: Four times a day (QID) | ORAL | Status: AC | PRN
Start: 2011-01-27 — End: 2011-02-06

## 2011-01-27 MED ORDER — HYDROCODONE-ACETAMINOPHEN 5-325 MG PO TABS
1.0000 | ORAL_TABLET | Freq: Once | ORAL | Status: AC
  Administered 2011-01-27: 1 via ORAL
  Filled 2011-01-27: qty 1

## 2011-01-27 MED ORDER — IBUPROFEN 800 MG PO TABS
800.0000 mg | ORAL_TABLET | Freq: Once | ORAL | Status: AC
  Administered 2011-01-27: 800 mg via ORAL
  Filled 2011-01-27: qty 1

## 2011-01-27 NOTE — ED Notes (Signed)
Pt has hx of the same- denies new injury c/o of left knee and leg pain that radiates to back. Numbness and tingling.  Pt has had HX of the same

## 2011-01-27 NOTE — ED Notes (Signed)
Pt is here for left knee pain that is chronic in nature, and chronic back pain,  Pt has been seen by Dr Quintella Reichert in past last being in December 2011,states she was going every 7 days andreceiving 30 10 mg Hydrocodone for her knee and back pain, pt states she has a ride that her mother is in lobby and can drive her home if given a narcotic.  Pt is alert and oriented, her PERRL are 4 mm and eyes reddened,  She is short with answers to questions. Pt states 10/10 pain left knee, pt ambulated to room without difficulty and to bathroom without assistance

## 2011-01-27 NOTE — ED Provider Notes (Signed)
History     CSN: [id number] Arrival date & time: 01/27/2011  1:22 AM   First MD Initiated Contact with Patient 01/27/11 [phone]      Chief Complaint  Patient presents with  . Knee Pain  . Back Pain    HPI: Patient is a 45 y.o. female presenting with knee pain and back pain. The history is provided by the patient. No language interpreter was used.  Knee Pain This is a recurrent problem. The current episode started today. The problem has been unchanged. The symptoms are aggravated by walking and standing. She has tried nothing for the symptoms.  Back Pain   Patient reports lengthy story involving injury to her left knee approximately 2 months ago. Admits that she was seen here and referred to an orthopedist. Was seen by orthopedist and learned insurance would not pay for the visit so is unable to return to the orthopedist. Patient with recurrent left knee pain since injury 2 months ago states pain radiates up left extremity and some pain in lower back. States was lifting heavy furniture today that has worsened pain and now w/ LBP as well. .  States that she moved temperature today which is exacerbated her pain though she denies new injury. Patient issomewhat  Manipulative and  hostile at times and states that she just returns to the emergency room for pain medicine when her pain worsens. States that she has lost her primary care provider due to lack of insurance. But admits that she has not made any effort to get re-established with primary care so that she could get the necessary referral to an orthopedist which she explains is  what she needs. Patient has been seen in the emergency department multiple times for this same complaint. Past Medical History  Diagnosis Date  . Arthritis   . Back pain     Past Surgical History  Procedure Date  . Cesarean section   . Knee surgery     History reviewed. No pertinent family history.  History  Substance Use Topics  . Smoking status: Current  Everyday Smoker -- 0.5 packs/day for 20 years  . Smokeless tobacco: Not on file  . Alcohol Use: No    OB History    Grav Para Term Preterm Abortions TAB SAB Ect Mult Living                  Review of Systems  Constitutional: Negative.   HENT: Negative.   Eyes: Negative.   Respiratory: Negative.   Cardiovascular: Negative.   Gastrointestinal: Negative.   Genitourinary: Negative.   Musculoskeletal: Positive for back pain.  Skin: Negative.   Neurological: Negative.   Hematological: Negative.   Psychiatric/Behavioral: Negative.     Allergies  Pyridium; Sulfa antibiotics; and Voltaren  Home Medications   Current Outpatient Rx  Name Route Sig Dispense Refill  . ALPRAZOLAM 1 MG PO TABS Oral Take 1 mg by mouth at bedtime as needed. Anxiety     . AMPHETAMINE-DEXTROAMPHETAMINE 30 MG PO TABS Oral Take 30 mg by mouth daily.      . IBUPROFEN 800 MG PO TABS Oral Take 800 mg by mouth every 8 (eight) hours as needed.      Carma Leaven M PLUS PO TABS Oral Take 1 tablet by mouth daily.        BP 121/65  Pulse 72  Temp(Src) 98.1 F (36.7 C) (Oral)  Resp 14  Wt 155 lb (70.308 kg)  SpO2 100%  LMP 01/20/2011  Physical Exam  Constitutional: She is oriented to person, place, and time. She appears well-developed and well-nourished.  HENT:  Head: Normocephalic and atraumatic.  Eyes: Conjunctivae are normal.  Neck: Normal range of motion.  Cardiovascular: Normal rate.   Pulmonary/Chest: Effort normal.  Musculoskeletal: Normal range of motion.       Right knee: She exhibits no swelling and no effusion. tenderness found.       Arms: Neurological: She is alert and oriented to person, place, and time. She has normal reflexes.  Skin: Skin is warm and dry.  Psychiatric: She has a normal mood and affect.    ED Course  Procedures LS spine imaging unremarkable. Discussed findings with patient and plan to discharge home with prescription for tramadol. Will provide primary care provider  referrals and encourage patient to get established with a primary care provider.  Labs Reviewed - No data to display No results found.   No diagnosis found.    MDM  Chronic knee pain w/ worsening symptoms and LBP after heavy lifting today. Manipulative and hostile. Suspect drug seeking.        Leanne Chang, NP 02/01/11 [phone]

## 2011-02-01 NOTE — ED Provider Notes (Signed)
Medical screening examination/treatment/procedure(s) were performed by non-physician practitioner and as supervising physician I was immediately available for consultation/collaboration.  Jasmine Awe, MD 02/01/11 2210

## 2011-02-18 ENCOUNTER — Encounter (HOSPITAL_COMMUNITY): Payer: Self-pay | Admitting: Emergency Medicine

## 2011-02-18 ENCOUNTER — Emergency Department (HOSPITAL_COMMUNITY)
Admission: EM | Admit: 2011-02-18 | Discharge: 2011-02-18 | Disposition: A | Discharge status: Home / Self Care | Payer: Medicaid Other | Attending: Emergency Medicine | Admitting: Emergency Medicine

## 2011-02-18 ENCOUNTER — Emergency Department (HOSPITAL_COMMUNITY): Payer: Medicaid Other

## 2011-02-18 ENCOUNTER — Emergency Department (HOSPITAL_COMMUNITY)
Admission: EM | Admit: 2011-02-18 | Discharge: 2011-02-18 | Discharge status: Against Medical Advice | Payer: Medicaid Other | Source: Home / Self Care

## 2011-02-18 DIAGNOSIS — W1789XA Other fall from one level to another, initial encounter: Secondary | ICD-10-CM | POA: Insufficient documentation

## 2011-02-18 DIAGNOSIS — M25569 Pain in unspecified knee: Secondary | ICD-10-CM | POA: Insufficient documentation

## 2011-02-18 DIAGNOSIS — F17200 Nicotine dependence, unspecified, uncomplicated: Secondary | ICD-10-CM | POA: Insufficient documentation

## 2011-02-18 DIAGNOSIS — S8000XA Contusion of unspecified knee, initial encounter: Secondary | ICD-10-CM | POA: Insufficient documentation

## 2011-02-18 DIAGNOSIS — S8001XA Contusion of right knee, initial encounter: Secondary | ICD-10-CM

## 2011-02-18 DIAGNOSIS — W010XXA Fall on same level from slipping, tripping and stumbling without subsequent striking against object, initial encounter: Secondary | ICD-10-CM | POA: Insufficient documentation

## 2011-02-18 DIAGNOSIS — R609 Edema, unspecified: Secondary | ICD-10-CM | POA: Insufficient documentation

## 2011-02-18 MED ORDER — HYDROCODONE-ACETAMINOPHEN 5-325 MG PO TABS: 1.0000 | ORAL_TABLET | ORAL | Status: AC | PRN

## 2011-02-18 NOTE — ED Provider Notes (Signed)
History     CSN: [id number] Arrival date & time: 02/18/2011  3:49 PM   First MD Initiated Contact with Patient 02/18/11 1756      Chief Complaint  Patient presents with  . Knee Pain  . Fall    (Consider location/radiation/quality/duration/timing/severity/associated sxs/prior treatment) HPI History provided by pt.   Pt was climbing a wall today to hang xmas lights, and fell 1-1.16ft, landing on both knees.  C/o pain, ecchymosis and edema of right knee.  Able to bear weight but aggravates pain.  Has not taken anything for pain.  Did not hit head and denies having pain anywhere else.  Tetanus up to date.   Past Medical History  Diagnosis Date  . Arthritis   . Back pain     Past Surgical History  Procedure Date  . Cesarean section   . Knee surgery   . Knee surgery     R knee    No family history on file.  History  Substance Use Topics  . Smoking status: Current Everyday Smoker -- 0.5 packs/day for 20 years  . Smokeless tobacco: Not on file  . Alcohol Use: No    OB History    Grav Para Term Preterm Abortions TAB SAB Ect Mult Living                  Review of Systems  All other systems reviewed and are negative.    Allergies  Pyridium; Sulfa antibiotics; Tramadol; and Voltaren  Home Medications   Current Outpatient Rx  Name Route Sig Dispense Refill  . ALPRAZOLAM 1 MG PO TABS Oral Take 1 mg by mouth at bedtime as needed. Anxiety    . AMPHETAMINE-DEXTROAMPHETAMINE 30 MG PO TABS Oral Take 30 mg by mouth daily.      Marlin Canary HEADACHE PO Oral Take 1 packet by mouth daily as needed. For pain.     . IBUPROFEN 800 MG PO TABS Oral Take 800 mg by mouth every 8 (eight) hours as needed. For pain.    Carma Leaven M PLUS PO TABS Oral Take 1 tablet by mouth daily.        BP 119/59  Pulse 90  Temp(Src) 98.2 F (36.8 C) (Oral)  Resp 20  SpO2 98%  LMP 01/20/2011  Physical Exam  Nursing note and vitals reviewed. Constitutional: She is oriented to person, place, and time.  She appears well-developed and well-nourished. No distress.  HENT:  Head: Normocephalic and atraumatic.  Eyes:       Normal appearance  Neck: Normal range of motion.  Musculoskeletal:       Right knee w/ edema and ecchymosis medial surface.  Linear, horizontal, superficial, hemostatic abrasion medial lower leg, approx 3 inc below tibial plateau. Ttp medial joint line and posterior knee.  Full ROM but pain w/ extreme flexion.  Nml ankle exam.  Distal NV intact.   Neurological: She is alert and oriented to person, place, and time.  Psychiatric: She has a normal mood and affect. Her behavior is normal.    ED Course  Procedures (including critical care time)  Labs Reviewed - No data to display Dg Knee Complete 4 Views Right  02/18/2011  *RADIOLOGY REPORT*  Clinical Data: Pain and swelling.  RIGHT KNEE - COMPLETE 4+ VIEW  Comparison: None.  Findings: No fracture.  No subluxation or dislocation. Degenerative spurring is noted in all three compartments.  No joint effusion.  5 mm linear radiopaque foreign bodies seen in the anterior soft  tissues of the proximal leg.  IMPRESSION: No acute bony findings are evidence of joint effusion.  Linear radiopaque foreign body in the anterior soft tissues of the proximal leg.  Original Report Authenticated By: ERIC A. MANSELL, M.D.     1. Contusion of right knee       MDM  Pt fell and landed on knees today.  C/o right knee pain/edema/ecchymosis.  Able to bear weight.  Xray neg for fx/disclocation.  Shows a small radiopaque foreign body just right of mid-line tibia.  Pt has an abrasion but located medial lower leg and is superficial.  Foreign object is not visible/palpable on exam.  Ortho tech placed in knee sleeve and pt discharged home w/ pain medication.          Otilio Miu, PA 02/19/11 1113  Arie Sabina [place]ane, [place] 02/19/11 1149

## 2011-02-18 NOTE — ED Notes (Signed)
Pt c/o falling on railroad tie in yard. Pt states just slipped. Pt c/o R knee pain.

## 2011-02-18 NOTE — ED Notes (Signed)
Pt presenting to ed with c/o fall while decorating her yard. Pt denies hitting her head. Pt denies loc. Pt is alert and oriented at this time. Pt states she is having right knee pain

## 2011-02-18 NOTE — ED Notes (Signed)
Pt left to pick up child at school. Singed. Pt ambulated out of ED.

## 2011-02-20 NOTE — ED Provider Notes (Signed)
Medical screening examination/treatment/procedure(s) were performed by non-physician practitioner and as supervising physician I was immediately available for consultation/collaboration.  Myiesha Edgar L Aeriana Speece, MD 02/20/11 2350 

## 2011-03-06 ENCOUNTER — Emergency Department (HOSPITAL_COMMUNITY)
Admission: EM | Admit: 2011-03-06 | Discharge: 2011-03-06 | Disposition: A | Discharge status: Home / Self Care | Payer: Medicaid Other

## 2011-03-06 NOTE — ED Notes (Signed)
Pt paged x3 - pager found. Pt not found x3 attempts

## 2011-04-11 ENCOUNTER — Emergency Department (HOSPITAL_COMMUNITY)
Admission: EM | Admit: 2011-04-11 | Discharge: 2011-04-11 | Disposition: A | Discharge status: Home / Self Care | Payer: Medicaid Other | Attending: Emergency Medicine | Admitting: Emergency Medicine

## 2011-04-11 ENCOUNTER — Encounter (HOSPITAL_COMMUNITY): Payer: Self-pay | Admitting: Emergency Medicine

## 2011-04-11 ENCOUNTER — Emergency Department (HOSPITAL_COMMUNITY): Payer: Medicaid Other

## 2011-04-11 DIAGNOSIS — M533 Sacrococcygeal disorders, not elsewhere classified: Secondary | ICD-10-CM | POA: Insufficient documentation

## 2011-04-11 DIAGNOSIS — S300XXA Contusion of lower back and pelvis, initial encounter: Secondary | ICD-10-CM | POA: Insufficient documentation

## 2011-04-11 DIAGNOSIS — M549 Dorsalgia, unspecified: Secondary | ICD-10-CM | POA: Insufficient documentation

## 2011-04-11 MED ORDER — METHOCARBAMOL 500 MG PO TABS: 1000.0000 mg | ORAL_TABLET | Freq: Four times a day (QID) | ORAL | Status: AC

## 2011-04-11 MED ORDER — PREDNISONE 20 MG PO TABS: ORAL_TABLET | ORAL | Status: DC

## 2011-04-11 MED ORDER — HYDROCODONE-ACETAMINOPHEN 5-325 MG PO TABS: 1.0000 | ORAL_TABLET | ORAL | Status: AC | PRN

## 2011-04-11 NOTE — ED Notes (Signed)
Pt back from Radiology. 

## 2011-04-11 NOTE — ED Provider Notes (Cosign Needed)
History     CSN: [id number]  Arrival date & time 04/11/11  4098   First MD Initiated Contact with Patient 04/11/11 1028      Chief Complaint  Patient presents with  . Fall    Pt fell two weeks ago, c/o low back pain  . 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 She relates she's not rollerblade for at least a year and 10 days ago she was showing her children something on the rollerblading she fell indicating her legs operated and she fell in a sitting position. She states she hurts where she sits and it radiates up to her neck and down her right lower leg. She states it feels like when she's had sciatica before. Patient states it also hurts when she sits. Nothing makes it feel better.  Patient also states she's having problems with her psychiatrist and had to change psychiatrist and the one she seems now will not write for her Adderall. She states she wants me to write her for her Adderall.  PCP none Psychiatrist Dr. Betti Cruz  Past Medical History  Diagnosis Date  . Arthritis   . Back pain     Past Surgical History  Procedure Date  . Cesarean section   . Knee surgery   . Knee surgery     R knee    History reviewed. No pertinent family history.  History  Substance Use Topics  . Smoking status: Current Everyday Smoker -- 0.5 packs/day for 20 years  . Smokeless tobacco: Not on file  . Alcohol Use: Yes   lives at home Unemployed  OB History    Grav Para Term Preterm Abortions TAB SAB Ect Mult Living                  Review of Systems  All other systems reviewed and are negative.    Allergies  Pyridium; Sulfa antibiotics; Tramadol; and Voltaren  Home Medications   Current Outpatient Rx  Name Route Sig Dispense Refill  . CLONAZEPAM 0.5 MG PO TABS Oral Take 0.5 mg by mouth 3 (three) times daily as needed. anxiety    .  IBUPROFEN 800 MG PO TABS Oral Take 800 mg by mouth every 8 (eight) hours as needed. For pain.    Carma Leaven M PLUS PO TABS Oral Take 1 tablet by mouth daily.      Marlin Canary HEADACHE PO Oral Take 1 packet by mouth daily as needed. headache      BP 98/58  Pulse 98  Temp(Src) 98.5 F (36.9 C) (Oral)  Resp 18  LMP 03/12/2011  Vital signs normal    Physical Exam  Nursing note and vitals reviewed. Constitutional: She is oriented to person, place, and time. She appears well-developed and well-nourished.  Non-toxic appearance. She does not appear ill. No distress.       Patient was sitting when I entered the room she then states she had to stand up she paced around and then she sat down again.  HENT:  Head: Normocephalic and atraumatic.  Right Ear: External ear normal.  Left Ear: External ear normal.  Nose: No mucosal edema or rhinorrhea.  Mouth/Throat: Mucous membranes are normal. No dental abscesses or uvula swelling.  Eyes: Conjunctivae and EOM are normal. Pupils are equal, round, and reactive to light.  Neck: Full passive range of motion without pain. Neck supple.  Pulmonary/Chest: She is in respiratory distress.  She has no rhonchi. She exhibits no crepitus.  Abdominal: Normal appearance.  Musculoskeletal: Normal range of motion. She exhibits no edema and no tenderness.       Moves all extremities well. Patient is extremely tender to palpation over her coccyx. This seems to be the source of her pain. She denies pain to palpation in her thoracic or lumbar spine. She does not have pain over the SI joint or in the sciatic notch.  Neurological: She is alert and oriented to person, place, and time. She has normal strength. No cranial nerve deficit.  Skin: Skin is warm, dry and intact. No rash noted. No erythema. No pallor.  Psychiatric: She has a normal mood and affect. Her speech is normal and behavior is normal. Her mood appears not anxious.    ED Course  Procedures (including critical care  time)  Labs Reviewed - No data to display Dg Sacrum/coccyx  04/11/2011  *RADIOLOGY REPORT*  Clinical Data: Fall roller skating.  Sacral coccygeal pain radiating down left leg.  SACRUM AND COCCYX - 2+ VIEW  Comparison: Lumbar spine films of 01/27/2011  Findings: Both femoral heads are located.  The sacroiliac joints are symmetric.  No sacral fracture identified.  Mild irregularity of the inferior coccygeal segments is favored to be degenerative.  IMPRESSION: No acute osseous abnormality.  Original Report Authenticated By: Consuello Bossier, M.D.    Diagnoses that have been ruled out:  None  Diagnoses that are still under consideration:  None  Final diagnoses:  Contusion of coccyx  Back pain   New Prescriptions   HYDROCODONE-ACETAMINOPHEN (NORCO) 5-325 MG PER TABLET    Take 1 tablet by mouth every 4 (four) hours as needed for pain.   METHOCARBAMOL (ROBAXIN) 500 MG TABLET    Take 2 tablets (1,000 mg total) by mouth 4 (four) times daily.   PREDNISONE (DELTASONE) 20 MG TABLET    Take 3 po QD x 2d starting tomorrow, then 2 po QD x 3d then 1 po QD x 3d    Plan discharge  Devoria Albe, MD, Armando Gang     MDM          Ward Givens, MD 04/11/11 [phone]

## 2011-04-11 NOTE — ED Notes (Signed)
Pt fell while roller bladeing, no decreased pain over two weeks

## 2011-04-29 ENCOUNTER — Encounter (HOSPITAL_COMMUNITY): Payer: Self-pay | Admitting: *Deleted

## 2011-04-29 ENCOUNTER — Emergency Department (HOSPITAL_COMMUNITY)
Admission: EM | Admit: 2011-04-29 | Discharge: 2011-04-29 | Disposition: A | Discharge status: Home / Self Care | Payer: Medicaid Other | Attending: Emergency Medicine | Admitting: Emergency Medicine

## 2011-04-29 DIAGNOSIS — F17200 Nicotine dependence, unspecified, uncomplicated: Secondary | ICD-10-CM | POA: Insufficient documentation

## 2011-04-29 DIAGNOSIS — M129 Arthropathy, unspecified: Secondary | ICD-10-CM | POA: Insufficient documentation

## 2011-04-29 DIAGNOSIS — K089 Disorder of teeth and supporting structures, unspecified: Secondary | ICD-10-CM | POA: Insufficient documentation

## 2011-04-29 DIAGNOSIS — IMO0002 Reserved for concepts with insufficient information to code with codable children: Secondary | ICD-10-CM | POA: Insufficient documentation

## 2011-04-29 DIAGNOSIS — S025XXA Fracture of tooth (traumatic), initial encounter for closed fracture: Secondary | ICD-10-CM

## 2011-04-29 MED ORDER — LIDOCAINE HCL 1 % IJ SOLN
2.0000 mL | Freq: Once | INTRAMUSCULAR | Status: AC
  Administered 2011-04-29: 04:00:00

## 2011-04-29 MED ORDER — BUPIVACAINE HCL (PF) 0.5 % IJ SOLN
10.0000 mL | Freq: Once | INTRAMUSCULAR | Status: AC
  Administered 2011-04-29: 04:00:00

## 2011-04-29 MED ORDER — LIDOCAINE HCL 1 % IJ SOLN
INTRAMUSCULAR | Status: AC
  Filled 2011-04-29: qty 20

## 2011-04-29 MED ORDER — BUPIVACAINE HCL (PF) 0.5 % IJ SOLN
INTRAMUSCULAR | Status: AC
  Filled 2011-04-29: qty 30

## 2011-04-29 NOTE — Discharge Instructions (Signed)
Tooth Fracture A tooth consists of the root and the crown. The yellowish, softer dentin is covered by enamel to form the crown. The crown is the visible white part of the tooth. The tooth is attached by the root to the tooth socket by small ligaments. The pulp of the tooth contains the nerves and blood vessels. These are found in the pulp chamber and root canals.  THREE MAIN AND DIFFERENT TOOTH INJURIES ARE:  A fracture usually splits the tooth into two or more parts, one attached to the socket and one free.   A luxation shifts the tooth position at the level of the root but does not remove it from the socket.   An avulsion removes the entire tooth from its socket.  A fracture can be classified as a root fracture, broken tooth (crown fracture), or chipped tooth. They can also be broken down to fracture types of crown, crown-root, or root. Sometimes they are also broken down into two categories that are simple (no pulp involvement) or complex (pulp involvement). A crown fracture can involve the pulp.  Tooth fracture may cause problems ranging from cosmetic defects to tooth death. Involvement of the pulp is the most important factor in severity rather than the amount of the tooth affected. Pulp involvement can be identified by a bleeding site or a pink or red dot in the middle of the dentin. Pulp exposure can be very painful. Limiting nerve exposure to air, saliva, temperature changes, and the tongue will decrease the pain. TREATMENT   The root canal can be sealed by covering the nerve with a drop of a "super glue," or cyanoacrylate. The use of cyanoacrylate has not been approved by the [place] Food and Drug Administration. This is called an off label use. However, cyanoacrylate is currently used in dentistry for similar purposes.   Gently biting into gauze or a towel will help control the bleeding. An exposed nerve requires a dental exam and care. Referral need not be immediate if the pain is controlled.     Cover exposed dentin with a layer of zinc oxide or calcium hydroxide paste (Dycal) if it is available.  PREVENTION   Most dental and associated injuries can be prevented or lessened by the use of a mouth guard. These should be worn in all contact.   Mouth guards also prevent concussions in contact and non contact sports.  Document Released: 02/27/2004 Document Revised: 11/07/2010 Document Reviewed: 02/13/2007 [facility] Patient Information 2012 [place], [place]. The dental nerve was blocked in the posterior portion of the broken tooth was sealed with dental cement, please call the dentist, Dr. Renard Hamper  First thing  in the morning to be seen today

## 2011-04-29 NOTE — ED Notes (Signed)
Tonight, circa 2330, the pt was eating a chicken and pasta dish when she "broke a tooth" that is located in the upper left side of her mouth.  Pt states that this is causing her much discomfort.

## 2011-04-29 NOTE — ED Provider Notes (Signed)
History     CSN: [id number]  Arrival date & time 04/29/11  0141   First MD Initiated Contact with Patient 04/29/11 0321      Chief Complaint  Patient presents with  . Dental Pain    (Consider location/radiation/quality/duration/timing/severity/associated sxs/prior treatment) HPI Comments: Patient states she was eating, and broke the posterior portion of her left upper second molar tonight.  Does not have a local dentist  Patient is a 46 y.o. female presenting with tooth pain. The history is provided by the patient.  Dental PainThe primary symptoms include dental injury. Primary symptoms do not include fever. The symptoms began 6 to 12 hours ago. The symptoms are worsening. The symptoms occur constantly.    Past Medical History  Diagnosis Date  . Arthritis   . Back pain     Past Surgical History  Procedure Date  . Cesarean section   . Knee surgery   . Knee surgery     R knee    No family history on file.  History  Substance Use Topics  . Smoking status: Current Everyday Smoker -- 0.5 packs/day for 10 years    Types: Cigarettes  . Smokeless tobacco: Not on file  . Alcohol Use: Yes    OB History    Grav Para Term Preterm Abortions TAB SAB Ect Mult Living                  Review of Systems  Constitutional: Negative for fever.  HENT: Positive for dental problem.     Allergies  Pyridium; Sulfa antibiotics; Tramadol; and Voltaren  Home Medications   No current outpatient prescriptions on file.  BP 124/50  Pulse 96  Temp(Src) 98.2 F (36.8 C) (Oral)  Resp 19  SpO2 97%  LMP 04/22/2011  Physical Exam  Constitutional: She appears well-developed.  HENT:  Mouth/Throat:    Eyes: Pupils are equal, round, and reactive to light.  Cardiovascular: Normal rate.   Pulmonary/Chest: Effort normal.  Abdominal: Soft.  Musculoskeletal: Normal range of motion.  Neurological: She is alert.  Skin: Skin is warm.    ED Course  NERVE BLOCK Date/Time: 04/29/2011  3:44 AM Performed by: Arman Filter Authorized by: Arman Filter Consent: Verbal consent obtained. Consent given by: patient Patient identity confirmed: verbally with patient Time out: Immediately prior to procedure a "time out" was called to verify the correct patient, procedure, equipment, support staff and site/side marked as required. Indications: pain relief Body area: face/mouth Laterality: left Patient position: supine Local anesthetic: bupivacaine 0.5% without epinephrine Anesthetic total: 1 ml Outcome: pain improved Patient tolerance: Patient tolerated the procedure well with no immediate complications. Comments: Nerve block around to the posterior portion of the tooth was also sealed with dental cement   (including critical care time)  Labs Reviewed - No data to display No results found.   1. Broken tooth       MDM  Dental fracture        Arman Filter, NP 04/29/11 0410  Medical screening examination/treatment/procedure(s) were performed by non-physician practitioner and as supervising physician I was immediately available for consultation/collaboration.  Sunnie Nielsen, MD 04/30/11 [phone]

## 2011-05-09 ENCOUNTER — Emergency Department (HOSPITAL_COMMUNITY)
Admission: EM | Admit: 2011-05-09 | Discharge: 2011-05-09 | Disposition: A | Discharge status: Home Health | Payer: Medicaid Other | Attending: Emergency Medicine | Admitting: Emergency Medicine

## 2011-05-09 DIAGNOSIS — R0981 Nasal congestion: Secondary | ICD-10-CM

## 2011-05-09 DIAGNOSIS — Z8739 Personal history of other diseases of the musculoskeletal system and connective tissue: Secondary | ICD-10-CM | POA: Insufficient documentation

## 2011-05-09 DIAGNOSIS — M79609 Pain in unspecified limb: Secondary | ICD-10-CM | POA: Insufficient documentation

## 2011-05-09 DIAGNOSIS — M79604 Pain in right leg: Secondary | ICD-10-CM

## 2011-05-09 DIAGNOSIS — J3489 Other specified disorders of nose and nasal sinuses: Secondary | ICD-10-CM | POA: Insufficient documentation

## 2011-05-09 MED ORDER — FLUTICASONE PROPIONATE 50 MCG/ACT NA SUSP: 2.0000 | Freq: Every day | NASAL | Status: DC

## 2011-05-09 MED ORDER — GUAIFENESIN 100 MG/5ML PO LIQD: 100.0000 mg | ORAL | Status: AC | PRN

## 2011-05-09 NOTE — ED Notes (Signed)
Pt reports terrible Headache, sinus pressure and sore throat x 2 days, pain from toe radiates up to upper thigh

## 2011-05-09 NOTE — ED Provider Notes (Signed)
History     CSN: [id number]  Arrival date & time 05/09/11  0804   First MD Initiated Contact with Patient 05/09/11 0809      No chief complaint on file.   (Consider location/radiation/quality/duration/timing/severity/associated sxs/prior treatment) HPI  46 year old female presents with multiple complaints. Patient states for the past 2 days she felt very congested. Congestions mainly affecting her sinus.  She is experiencing runny nose, nasal congestion, sore throat, sneezing, coughing, ear congestion and a frontal headache. Symptoms persist. However she denies fever, chills, chest pain, throat swelling, shortness of breath, nausea, vomiting, diarrhea, abdominal pain, dysuria, rash. She has tried taking over-the-counter ibuprofen with minimal relief. 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  Patient also complaining of pain to her right lower leg with some associated muscle spasm. She has similar pain in the past. She has tried taking some muscle relaxant without relief. Pain worsened with ambulating. She denies numbness or weakness. She denies back pain.  Past Medical History  Diagnosis Date  . Arthritis   . Back pain     Past Surgical History  Procedure Date  . Cesarean section   . Knee surgery   . Knee surgery     R knee    No family history on file.  History  Substance Use Topics  . Smoking status: Current Everyday Smoker -- 0.5 packs/day for 10 years    Types: Cigarettes  . Smokeless tobacco: Not on file  . Alcohol Use: Yes    OB History    Grav Para Term Preterm Abortions TAB SAB Ect Mult Living                  Review of Systems  All other systems reviewed and are negative.    Allergies  Pyridium; Sulfa antibiotics; Tramadol; and Voltaren  Home Medications  No current outpatient prescriptions on file.  BP 141/59  Pulse 85  Temp(Src) 97.9 F (36.6 C) (Oral)  Resp 16  SpO2 100%  LMP 04/22/2011  Physical Exam  Nursing  note and vitals reviewed. Constitutional: She appears well-developed and well-nourished.  HENT:  Head: Normocephalic and atraumatic. No trismus in the jaw.  Right Ear: Tympanic membrane normal.  Left Ear: Tympanic membrane normal.  Nose: Mucosal edema present. No rhinorrhea or sinus tenderness.  Mouth/Throat: Uvula is midline, oropharynx is clear and moist and mucous membranes are normal. No uvula swelling. No oropharyngeal exudate, posterior oropharyngeal edema, posterior oropharyngeal erythema or tonsillar abscesses.  Eyes: Conjunctivae and EOM are normal. Pupils are equal, round, and reactive to light.  Neck: Normal range of motion. Neck supple. No Brudzinski's sign noted.  Cardiovascular: Normal rate and regular rhythm.   Pulmonary/Chest: Effort normal and breath sounds normal. No respiratory distress. She has no wheezes. She exhibits no tenderness.  Abdominal: Soft. Bowel sounds are normal. She exhibits no distension.  Musculoskeletal: Normal range of motion. She exhibits no edema and no tenderness.       Right hip: Normal.       Right knee: Normal.       Right ankle: Normal.  Lymphadenopathy:    She has no cervical adenopathy.  Neurological: She is alert.  Skin: Skin is warm and dry.  Psychiatric: She has a normal mood and affect.    ED Course  Procedures (including critical care time)  Labs Reviewed - No data to display No results found.   No diagnosis found.    MDM  Patient with sinus headaches  and symptoms consistence with an upper respiratory infection. It is likely secondary to the sick exposure. Her frontal sinuses and mastoid sinuses mildly tender on percussion.  She is afebrile with stable vital signs. She has no evidence of meningismus. I will give treatment for upper respiratory infection.  She is in no acute distress. She is complaining of lower leg pain this is chronic. Normal gait.          Fayrene Helper, PA-C 05/09/11 1610  Fayrene Helper, PA-C 05/12/11  [phone]

## 2011-05-09 NOTE — Discharge Instructions (Signed)
Take Flonase and Robitussin for your symptoms continue to take ibuprofen for pain. Follow up with primary care Dr. for further evaluation. Return if you have high fever, unable to keep medication down, or if you have any other concern.  Upper Respiratory Infection, Adult An upper respiratory infection (URI) is also known as the common cold. It is often caused by a type of germ (virus). Colds are easily spread (contagious). You can pass it to others by kissing, coughing, sneezing, or drinking out of the same glass. Usually, you get better in 1 or 2 weeks.  HOME CARE   Only take medicine as told by your doctor.   Use a warm mist humidifier or breathe in steam from a hot shower.   Drink enough water and fluids to keep your pee (urine) clear or pale yellow.   Get plenty of rest.   Return to work when your temperature is back to normal or as told by your doctor. You may use a face mask and wash your hands to stop your cold from spreading.  GET HELP RIGHT AWAY IF:   After the first few days, you feel you are getting worse.   You have questions about your medicine.   You have chills, shortness of breath, or brown or red spit (mucus).   You have yellow or brown snot (nasal discharge) or pain in the face, especially when you bend forward.   You have a fever, puffy (swollen) neck, pain when you swallow, or white spots in the back of your throat.   You have a bad headache, ear pain, sinus pain, or chest pain.   You have a high-pitched whistling sound when you breathe in and out (wheezing).   You have a lasting cough or cough up blood.   You have sore muscles or a stiff neck.  MAKE SURE YOU:   Understand these instructions.   Will watch your condition.   Will get help right away if you are not doing well or get worse.  Document Released: 08/14/2007 Document Revised: 11/07/2010 Document Reviewed: 07/02/2010 [facility] Patient Information 2012 [place], [place].   RESOURCE GUIDE  Dental  Problems  Patients with Medicaid: [facility]                     [phone] [address]                                           Phone:  [phone]                                                  If unable to pay or uninsured, contact:  [facility] or [facility]. to become qualified for the adult dental clinic.  Chronic Pain Problems Contact [facility]  [phone] Patients need to be referred by their primary care doctor.  Insufficient Money for Medicine Contact United Way:  call "211" or [facility] [phone].  No Primary Care Doctor Call [facility]  [phone] Other agencies that provide inexpensive medical care    [facility]  [phone]    [facility] Internal Medicine  [phone]    [facility]  [phone]  Women's Clinic  [phone]    [facility]  [phone]    [facility]  [phone]  Substance Abuse Resources [facility]  [phone])572-8613 [facility] [phone] [facility] [phone] Floydene Flock [phone] Residential & Outpatient Substance Abuse Program  7854264534  Psychological Services [facility]  [phone] [facility]  [phone] [facility]   [phone] (emergency services [phone])  Abuse/Neglect [facility] Child Abuse Hotline (903)700-2275 [facility] Child Abuse Hotline 506-375-9328 (After Hours)  Emergency Shelter [facility] [phone]  M[facility] Homes [facility] [place] [facility] [phone] [facility] [phone]  MRSA Hotline #:   352-154-9968    [facility] Resources  [facility] [place]  United Way                           [facility] Dept. [address] [place]                     [address]         [address]  Blondell Reveal Phone:  [phone]                                  Phone:  [phone]                   Phone:  [phone]  [facility] Phone:  [phone]  [facility] Child Abuse Hotline 854-880-4048 626-170-0720 (After Hours)

## 2011-05-12 NOTE — ED Provider Notes (Signed)
Medical screening examination/treatment/procedure(s) were performed by non-physician practitioner and as supervising physician I was immediately available for consultation/collaboration. Devoria Albe, MD, Armando Gang   Ward Givens, MD 05/12/11 [phone]

## 2011-05-14 ENCOUNTER — Encounter (HOSPITAL_COMMUNITY): Payer: Self-pay | Admitting: *Deleted

## 2011-05-14 ENCOUNTER — Emergency Department (HOSPITAL_COMMUNITY)
Admission: EM | Admit: 2011-05-14 | Discharge: 2011-05-14 | Disposition: A | Discharge status: Home / Self Care | Payer: Medicaid Other | Attending: Emergency Medicine | Admitting: Emergency Medicine

## 2011-05-14 DIAGNOSIS — K0889 Other specified disorders of teeth and supporting structures: Secondary | ICD-10-CM

## 2011-05-14 DIAGNOSIS — R51 Headache: Secondary | ICD-10-CM | POA: Insufficient documentation

## 2011-05-14 DIAGNOSIS — S025XXA Fracture of tooth (traumatic), initial encounter for closed fracture: Secondary | ICD-10-CM | POA: Insufficient documentation

## 2011-05-14 DIAGNOSIS — K089 Disorder of teeth and supporting structures, unspecified: Secondary | ICD-10-CM | POA: Insufficient documentation

## 2011-05-14 DIAGNOSIS — M129 Arthropathy, unspecified: Secondary | ICD-10-CM | POA: Insufficient documentation

## 2011-05-14 DIAGNOSIS — F17200 Nicotine dependence, unspecified, uncomplicated: Secondary | ICD-10-CM | POA: Insufficient documentation

## 2011-05-14 DIAGNOSIS — X58XXXA Exposure to other specified factors, initial encounter: Secondary | ICD-10-CM | POA: Insufficient documentation

## 2011-05-14 DIAGNOSIS — Z79899 Other long term (current) drug therapy: Secondary | ICD-10-CM | POA: Insufficient documentation

## 2011-05-14 MED ORDER — PENICILLIN V POTASSIUM 500 MG PO TABS: 500.0000 mg | ORAL_TABLET | Freq: Three times a day (TID) | ORAL | Status: AC

## 2011-05-14 MED ORDER — HYDROCODONE-ACETAMINOPHEN 5-325 MG PO TABS: 1.0000 | ORAL_TABLET | ORAL | Status: AC | PRN

## 2011-05-14 NOTE — ED Notes (Signed)
Please see triage note

## 2011-05-14 NOTE — Progress Notes (Signed)
ED CM contacted by Consuella Lose, Triage nurse. Medicaid pt needing assistance with dental services.  CM spoke with pt. Reviewed & empowered her how to verified medicaid dental services by contacting her DSS case worker, medicaid toll free number or medicaid website considering providers change their availability frequently.  ED nursing staff provided pt with a list of dental providers and CM provided her with 2 others.  Pt states she has already verified Dr Maurice March does not accept medicaid providers.  Cm discussed the [facility] outpatient pharmacy availability to assist with filling Rx.  Pt voiced understanding and appreciation of services offered

## 2011-05-14 NOTE — Discharge Instructions (Signed)
Take antibiotic as prescribed. Take vicodin as prescribed for severe pain.   Do not drive within four hours of taking this medication (may cause drowsiness or confusion).  Take ibuprofen w/ food up to three times a day, as well.  Follow up with a dentist as soon as possible.  Attached is a list of low cost dental clinics in [place] and [place] or you can follow up with the dentist on call.  You should return to the ER if you develop worsening symptoms or facial swelling.  

## 2011-05-14 NOTE — ED Notes (Signed)
Pt reports L upper toothache-has been seen here last week for same.  Cannot find dental office to accept medicaid.  Pt reports broken tooth.  Pt denies drainage or signs of an infection.

## 2011-05-14 NOTE — ED Provider Notes (Signed)
Medical screening examination/treatment/procedure(s) were performed by non-physician practitioner and as supervising physician I was immediately available for consultation/collaboration.  Doug Sou, MD 05/14/11 [phone]

## 2011-05-14 NOTE — ED Notes (Signed)
Kim case mgr at bedside 

## 2011-05-14 NOTE — ED Provider Notes (Signed)
History     CSN: [id number]  Arrival date & time 05/14/11  6578   First MD Initiated Contact with Patient 05/14/11 [phone]      Chief Complaint  Patient presents with  . Dental Pain    (Consider location/radiation/quality/duration/timing/severity/associated sxs/prior treatment) HPI History provided by pt.   Pt has had a left upper toothache for the past several weeks.  Radiates to left forehead.  No relief w/ ibuprofen.  Per prior chart, pt seen for same in ED on 04/24/11 and was referred to a dentist at that time.  Pt contacted dentist and he said that he was not on call and does not accept medicaid.  Referred her back to ED.   Past Medical History  Diagnosis Date  . Arthritis   . Back pain     Past Surgical History  Procedure Date  . Cesarean section   . Knee surgery   . Knee surgery     R knee    No family history on file.  History  Substance Use Topics  . Smoking status: Current Everyday Smoker -- 0.5 packs/day for 10 years    Types: Cigarettes  . Smokeless tobacco: Not on file  . Alcohol Use: Yes    OB History    Grav Para Term Preterm Abortions TAB SAB Ect Mult Living                  Review of Systems  All other systems reviewed and are negative.    Allergies  Pyridium; Sulfa antibiotics; Tramadol; and Voltaren  Home Medications   Current Outpatient Rx  Name Route Sig Dispense Refill  . CLONAZEPAM 0.5 MG PO TABS Oral Take 0.5 mg by mouth 3 (three) times daily as needed. For anxiety    . GUAIFENESIN 100 MG/5ML PO LIQD Oral Take 5-10 mLs (100-200 mg total) by mouth every 4 (four) hours as needed for cough. 60 mL 0  . ADULT MULTIVITAMIN W/MINERALS CH Oral Take 1 tablet by mouth daily.    [name] FLUTICASONE PROPIONATE 50 MCG/ACT NA SUSP Nasal Place 2 sprays into the nose daily. 16 g 2  . HYDROCODONE-ACETAMINOPHEN 5-325 MG PO TABS Oral Take 1 tablet by mouth every 4 (four) hours as needed for pain. 15 tablet 0  . PENICILLIN V POTASSIUM 500 MG PO TABS Oral Take  1 tablet (500 mg total) by mouth 3 (three) times daily. 30 tablet 0    BP 122/67  Pulse 90  Temp(Src) 97.6 F (36.4 C) (Oral)  Resp 20  SpO2 100%  LMP 04/22/2011  Physical Exam  Nursing note and vitals reviewed. Constitutional: She is oriented to person, place, and time. She appears well-developed and well-nourished.  HENT:  Head: Normocephalic and atraumatic. No trismus in the jaw.  Mouth/Throat: Uvula is midline and oropharynx is clear and moist.       Left upper second molar w/ 2 fillings.  Posterior lateral corner avulsed.  Ttp.  Adjacent gingiva appears normal.  Eyes:       Normal appearance  Neck: Normal range of motion. Neck supple.  Lymphadenopathy:    She has no cervical adenopathy.  Neurological: She is alert and oriented to person, place, and time.  Psychiatric: She has a normal mood and affect. Her behavior is normal.    ED Course  Procedures (including critical care time)  Labs Reviewed - No data to display No results found.   1. Toothache       MDM  Pt presents  for second time in the past month w/ left upper second molar pain.  Dentist referred to previously will not accept her insurance.  Exam concerning for periapical abscess.  D/c'd home w/ penicillin, 15 vicodin and referral to several low cost dental clinics in [place] and [place].         Otilio Miu, PA 05/14/11 1027

## 2011-06-23 ENCOUNTER — Encounter (HOSPITAL_COMMUNITY): Payer: Self-pay | Admitting: *Deleted

## 2011-06-23 ENCOUNTER — Emergency Department (HOSPITAL_COMMUNITY): Payer: Medicaid Other

## 2011-06-23 ENCOUNTER — Emergency Department (HOSPITAL_COMMUNITY)
Admission: EM | Admit: 2011-06-23 | Discharge: 2011-06-23 | Disposition: A | Discharge status: Home / Self Care | Payer: Medicaid Other | Attending: Emergency Medicine | Admitting: Emergency Medicine

## 2011-06-23 DIAGNOSIS — M25569 Pain in unspecified knee: Secondary | ICD-10-CM | POA: Insufficient documentation

## 2011-06-23 DIAGNOSIS — M129 Arthropathy, unspecified: Secondary | ICD-10-CM | POA: Insufficient documentation

## 2011-06-23 DIAGNOSIS — F17200 Nicotine dependence, unspecified, uncomplicated: Secondary | ICD-10-CM | POA: Insufficient documentation

## 2011-06-23 DIAGNOSIS — M79609 Pain in unspecified limb: Secondary | ICD-10-CM | POA: Insufficient documentation

## 2011-06-23 MED ORDER — OXYCODONE-ACETAMINOPHEN 5-325 MG PO TABS
2.0000 | ORAL_TABLET | Freq: Four times a day (QID) | ORAL | Status: AC | PRN
Start: 2011-06-23 — End: 2011-07-03

## 2011-06-23 NOTE — ED Notes (Signed)
Pt was advised that EDP will review xray, then speak with her

## 2011-06-23 NOTE — ED Provider Notes (Signed)
History     CSN: [id number]  Arrival date & time 06/23/11  6578   First MD Initiated Contact with Patient 06/23/11 [phone]      Chief Complaint  Patient presents with  . Knee Pain    Bilateral  . Leg Pain    Bilateral    (Consider location/radiation/quality/duration/timing/severity/associated sxs/prior treatment) HPI Patient is a 46 yo female with bilateral knee pain but no new injuries.  She was evaluated several months ago for her right knee pain after an acute injury with negative films.  The left knee has never been imaged but the patient fears it may have been injured in a different fall sometime ago.  Patient tried to follow-up with orthopedics but had some difficulty due to prior outstanding bills.  She is on medicare now and has followed-up with a PCP who is getting her referred into a different orthopedics office.  Patient says pain is worse with ambulating an palpation and rates it an 8/10.  It radiated up and down her legs.There are no other associated or modifying factors.  Past Medical History  Diagnosis Date  . Arthritis   . Back pain     Past Surgical History  Procedure Date  . Cesarean section   . Knee surgery   . Knee surgery     R knee    History reviewed. No pertinent family history.  History  Substance Use Topics  . Smoking status: Current Everyday Smoker -- 0.5 packs/day for 10 years    Types: Cigarettes  . Smokeless tobacco: Never Used  . Alcohol Use: Yes     socially    OB History    Grav Para Term Preterm Abortions TAB SAB Ect Mult Living                  Review of Systems  Constitutional: Negative.   HENT: Negative.   Eyes: Negative.   Respiratory: Negative.   Cardiovascular: Negative.   Gastrointestinal: Negative.   Genitourinary: Negative.   Musculoskeletal: Positive for arthralgias.  Skin: Negative.   Neurological: Negative.   Hematological: Negative.   Psychiatric/Behavioral: Negative.   All other systems reviewed and are  negative.    Allergies  Pyridium; Sulfa antibiotics; Tramadol; and Voltaren  Home Medications   Current Outpatient Rx  Name Route Sig Dispense Refill  . GOODY HEADACHE PO Oral Take 1 packet by mouth every 6 (six) hours as needed. HEADACHE    . CLONAZEPAM 0.5 MG PO TABS Oral Take 0.5 mg by mouth 3 (three) times daily as needed. For anxiety    . GLUCOSAMINE-CHONDROITIN 500-400 MG PO TABS Oral Take 1 tablet by mouth 3 (three) times daily.    . IBUPROFEN 200 MG PO TABS Oral Take 800 mg by mouth every 6 (six) hours as needed. PAIN    . ADULT MULTIVITAMIN W/MINERALS CH Oral Take 1 tablet by mouth daily.      BP 118/63  Pulse 91  Temp(Src) 98.2 F (36.8 C) (Oral)  Resp 16  SpO2 99%  LMP 06/15/2011  Physical Exam  Nursing note and vitals reviewed. Constitutional: She is oriented to person, place, and time. She appears well-developed and well-nourished. No distress.  HENT:  Head: Normocephalic and atraumatic.  Eyes: Conjunctivae are normal. Pupils are equal, round, and reactive to light.  Neck: Normal range of motion.  Cardiovascular: Normal rate and regular rhythm.   Pulmonary/Chest: Effort normal.  Musculoskeletal:       Patient without ligamentous instability in either knee.  Prior scar from arthroscopic surgery sen on right knee.  Neither knee with effusion, overlying erythema, or ligamentous instability.  Patient NVI distally..  Neurological: She is alert and oriented to person, place, and time.  Skin: Skin is warm and dry. No rash noted.  Psychiatric: She has a normal mood and affect.    ED Course  Procedures (including critical care time)  Labs Reviewed - No data to display Dg Knee Complete 4 Views Left  06/23/2011  *RADIOLOGY REPORT*  Clinical Data: 46 year old female with left knee pain.  LEFT KNEE - COMPLETE 4+ VIEW  Comparison: 12/15/2010  Findings: There is no evidence of fracture, subluxation or dislocation. No joint effusion is identified. Mild joint space narrowing  in the medial and patellofemoral compartments noted. No focal bony lesions are identified.  IMPRESSION: No evidence of acute abnormality.  Mild degenerative changes of the medial and patellofemoral compartments.  Original Report Authenticated By: Rosendo Gros, M.D.     1. Knee pain       MDM  Patient evaluated with no emergent issues.  Plain film of left knee also only with degenerative changes.  Patient given pain medications and referred again to ortho.        Cyndra Numbers, MD 06/24/11 1311

## 2011-06-23 NOTE — ED Notes (Signed)
Pt from home with reports of falling several times after losing balance, denies dizziness or syncope and injuring both knees which continues to cause pain, endorses that left knee is more painful and swollen than right. 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

## 2011-06-30 ENCOUNTER — Encounter (HOSPITAL_COMMUNITY): Payer: Self-pay | Admitting: *Deleted

## 2011-06-30 ENCOUNTER — Emergency Department (HOSPITAL_COMMUNITY)
Admission: EM | Admit: 2011-06-30 | Discharge: 2011-06-30 | Disposition: A | Discharge status: Home / Self Care | Payer: Medicaid Other | Attending: Emergency Medicine | Admitting: Emergency Medicine

## 2011-06-30 DIAGNOSIS — J029 Acute pharyngitis, unspecified: Secondary | ICD-10-CM | POA: Insufficient documentation

## 2011-06-30 DIAGNOSIS — F17200 Nicotine dependence, unspecified, uncomplicated: Secondary | ICD-10-CM | POA: Insufficient documentation

## 2011-06-30 DIAGNOSIS — M129 Arthropathy, unspecified: Secondary | ICD-10-CM | POA: Insufficient documentation

## 2011-06-30 DIAGNOSIS — J309 Allergic rhinitis, unspecified: Secondary | ICD-10-CM | POA: Insufficient documentation

## 2011-06-30 DIAGNOSIS — M25569 Pain in unspecified knee: Secondary | ICD-10-CM | POA: Insufficient documentation

## 2011-06-30 MED ORDER — OXYCODONE-ACETAMINOPHEN 5-325 MG PO TABS: 1.0000 | ORAL_TABLET | ORAL | Status: DC | PRN

## 2011-06-30 MED ORDER — CETIRIZINE-PSEUDOEPHEDRINE ER 5-120 MG PO TB12: 1.0000 | ORAL_TABLET | Freq: Every day | ORAL | Status: DC

## 2011-06-30 MED ORDER — OXYCODONE-ACETAMINOPHEN 5-325 MG PO TABS
1.0000 | ORAL_TABLET | Freq: Once | ORAL | Status: AC
  Administered 2011-06-30: 1 via ORAL
  Filled 2011-06-30: qty 1

## 2011-06-30 MED ORDER — OXYMETAZOLINE HCL 0.05 % NA SOLN: 2.0000 | Freq: Two times a day (BID) | NASAL | Status: AC

## 2011-06-30 NOTE — Discharge Instructions (Signed)
Allergic Rhinitis Allergic rhinitis is when the mucous membranes in the nose respond to allergens. Allergens are particles in the air that cause your body to have an allergic reaction. This causes you to release allergic antibodies. Through a chain of events, these eventually cause you to release histamine into the blood stream (hence the use of antihistamines). Although meant to be protective to the body, it is this release that causes your discomfort, such as frequent sneezing, congestion and an itchy runny nose.  CAUSES  The pollen allergens may come from grasses, trees, and weeds. This is seasonal allergic rhinitis, or "hay fever." Other allergens cause year-round allergic rhinitis (perennial allergic rhinitis) such as house dust mite allergen, pet dander and mold spores.  SYMPTOMS   Nasal stuffiness (congestion).   Runny, itchy nose with sneezing and tearing of the eyes.   There is often an itching of the mouth, eyes and ears.  It cannot be cured, but it can be controlled with medications. DIAGNOSIS  If you are unable to determine the offending allergen, skin or blood testing may find it. TREATMENT   Avoid the allergen.   Medications and allergy shots (immunotherapy) can help.   Hay fever may often be treated with antihistamines in pill or nasal spray forms. Antihistamines block the effects of histamine. There are over-the-counter medicines that may help with nasal congestion and swelling around the eyes. Check with your caregiver before taking or giving this medicine.  If the treatment above does not work, there are many new medications your caregiver can prescribe. Stronger medications may be used if initial measures are ineffective. Desensitizing injections can be used if medications and avoidance fails. Desensitization is when a patient is given ongoing shots until the body becomes less sensitive to the allergen. Make sure you follow up with your caregiver if problems continue. SEEK  MEDICAL CARE IF:   You develop fever (more than 100.5 F (38.1 C).   You develop a cough that does not stop easily (persistent).   You have shortness of breath.   You start wheezing.   Symptoms interfere with normal daily activities.  Document Released: 11/20/2000 Document Revised: 02/14/2011 Document Reviewed: 06/01/2008 [employer] Patient Information 2012 [employer].  RESOURCE GUIDE  Dental Problems  Patients with Medicaid: [facility]                     [facility] [address]                                           [address] Phone:  [phone]                                                   Phone:  [phone]  If unable to pay or uninsured, contact:  [facility] or [facility] to become qualified for the adult dental clinic.  Chronic Pain Problems Contact [facility]  [phone] Patients need to be referred by their primary care doctor.  Insufficient Money for Medicine Contact United Way:  call "211" or [facility] [phone].  No Primary Care Doctor Call [facility]  [phone] Other agencies that provide inexpensive medical care      [facility]  [phone]    [facility]  [phone]    [facility]  [phone]    Women's Clinic  [phone]    [facility]  [phone]    [facility]  [phone]  Psychological Services [facility]  [phone] [facility]  [phone] [facility]   [phone] (emergency services [phone])  Abuse/Neglect [place] Child Abuse Hotline (336) 641-3795 [place] Child Abuse Hotline 800-378-5315 (After Hours)  Emergency Shelter [facility] (336) 271-5985  [facility] (336) [phone] [facility] [phone]) [phone]  MRSA Hotline #:   832-7006    [place] Resources  [facility]  [place]  [employer] Way                           [facility] [address] [place]                     [address]         [address]  [place]                                               [place]                              [place] Phone:  [phone]                                  Phone:  [phone]                   Phone:  [phone]  [facility] Phone:  [phone]  [place] Child Abuse Hotline (336) 342-1394 (336) 342-3537 (After Hours)  

## 2011-06-30 NOTE — ED Notes (Signed)
Pt c/o sore throat since Thursday. Pt c/o knee pain x 10 days. Pt states she has been taking oxycodone and has run out.

## 2011-06-30 NOTE — ED Provider Notes (Signed)
History     CSN: [id number]  Arrival date & time 06/30/11  0214   First MD Initiated Contact with Patient 06/30/11 [phone]      Chief Complaint  Patient presents with  . Knee Pain  . Sore Throat    (Consider location/radiation/quality/duration/timing/severity/associated sxs/prior treatment) HPI  46yoF pw multiple complaints. States that she's recently opened her windows and experienced increased watery and itchy eyes, nasal congestion with yellow phlegm and post nasal drip. Complaining of sore throat as well and that "my throat looks more red than usual". Denies noisy breathing, throat swelling. Dec PO intake 2/2 pain. Min cough. Denies c/p, sob. Pt also complaining of b/l knee pain L > R after fall several weeks ago. Was seen in ED 4/14 for same and XR without fracture or other acute abnormality. States that the percocet prescribed has been helping and that her PMD is referring her to orthopedics but she ran out of her percocet.   ED Notes, ED Provider Notes from 06/30/11 0000 to 06/30/11 03:22:28       Ahmed Prima, RN 06/30/2011 02:29      Pt c/o sore throat since Thursday. Pt c/o knee pain x 10 days. Pt states she has been taking oxycodone and has run out.    Past Medical History  Diagnosis Date  . Arthritis   . Back pain     Past Surgical History  Procedure Date  . Cesarean section   . Knee surgery   . Knee surgery     R knee    History reviewed. No pertinent family history.  History  Substance Use Topics  . Smoking status: Current Everyday Smoker -- 0.5 packs/day for 10 years    Types: Cigarettes  . Smokeless tobacco: Never Used  . Alcohol Use: Yes     socially    OB History    Grav Para Term Preterm Abortions TAB SAB Ect Mult Living                  Review of Systems  All other systems reviewed and are negative.  except as noted HPI   Allergies  Pyridium; Sulfa antibiotics; Tramadol; and Voltaren  Home Medications   Current Outpatient Rx    Name Route Sig Dispense Refill  . GOODY HEADACHE PO Oral Take 1 packet by mouth every 6 (six) hours as needed. HEADACHE    . CLONAZEPAM 0.5 MG PO TABS Oral Take 0.5 mg by mouth 3 (three) times daily as needed. For anxiety    . GLUCOSAMINE-CHONDROITIN 500-400 MG PO TABS Oral Take 1 tablet by mouth 3 (three) times daily.    . IBUPROFEN 200 MG PO TABS Oral Take 800 mg by mouth every 6 (six) hours as needed. PAIN    . ADULT MULTIVITAMIN W/MINERALS CH Oral Take 1 tablet by mouth daily.    . OXYCODONE-ACETAMINOPHEN 5-325 MG PO TABS Oral Take 2 tablets by mouth every 6 (six) hours as needed for pain. 30 tablet 0  . CETIRIZINE-PSEUDOEPHEDRINE ER 5-120 MG PO TB12 Oral Take 1 tablet by mouth daily. 30 tablet 0  . OXYCODONE-ACETAMINOPHEN 5-325 MG PO TABS Oral Take 1 tablet by mouth every 4 (four) hours as needed for pain. 10 tablet 0  . OXYMETAZOLINE HCL 0.05 % NA SOLN Nasal Place 2 sprays into the nose 2 (two) times daily. 30 mL 0    BP 104/62  Pulse 87  Temp(Src) 98.4 F (36.9 C) (Oral)  Resp 18  SpO2 100%  LMP 06/15/2011  Physical Exam  Nursing note and vitals reviewed. Constitutional: She is oriented to person, place, and time. She appears well-developed.  HENT:  Head: Atraumatic.  Mouth/Throat: Oropharynx is clear and moist.       B/l TM unremarkable No trismus No tonsillar swelling Min diffuse erythema posterior OP No exudates No uvular swelling  Eyes: Conjunctivae and EOM are normal. Pupils are equal, round, and reactive to light.  Neck: Normal range of motion. Neck supple.  Cardiovascular: Normal rate, regular rhythm, normal heart sounds and intact distal pulses.   Pulmonary/Chest: Effort normal and breath sounds normal. No respiratory distress. She has no wheezes. She has no rales.  Abdominal: Soft. She exhibits no distension. There is no tenderness. There is no rebound and no guarding.  Musculoskeletal: Normal range of motion.       Strength 5/5 b/l LE Distal pulses intact Lt  knee without swelling/erythema  Neurological: She is alert and oriented to person, place, and time.  Skin: Skin is warm and dry. No rash noted.  Psychiatric: She has a normal mood and affect.    ED Course  Procedures (including critical care time)   Labs Reviewed  RAPID STREP SCREEN  STREP A DNA PROBE   No results found.   1. Sore throat   2. Allergic rhinitis     MDM   Allergic rhinitis with sore throat. Supportive care, Given prescription for zyrtec, afrin. Given percocet for chronic knee pain-- will f/u with ortho as referred by her PMD. Do not feel like the condition is related to her sore throat, specifically, I do not suspect gc as cause for pain.        Forbes Cellar, MD 06/30/11 [phone]

## 2011-07-01 LAB — STREP A DNA PROBE: Group A Strep Probe: NEGATIVE

## 2011-07-03 ENCOUNTER — Emergency Department (HOSPITAL_COMMUNITY)
Admission: EM | Admit: 2011-07-03 | Discharge: 2011-07-03 | Disposition: A | Discharge status: Home / Self Care | Payer: Medicaid Other | Attending: Emergency Medicine | Admitting: Emergency Medicine

## 2011-07-03 ENCOUNTER — Encounter (HOSPITAL_COMMUNITY): Payer: Self-pay | Admitting: Emergency Medicine

## 2011-07-03 DIAGNOSIS — M25569 Pain in unspecified knee: Secondary | ICD-10-CM | POA: Insufficient documentation

## 2011-07-03 DIAGNOSIS — Z882 Allergy status to sulfonamides status: Secondary | ICD-10-CM | POA: Insufficient documentation

## 2011-07-03 DIAGNOSIS — F17200 Nicotine dependence, unspecified, uncomplicated: Secondary | ICD-10-CM | POA: Insufficient documentation

## 2011-07-03 DIAGNOSIS — Z881 Allergy status to other antibiotic agents status: Secondary | ICD-10-CM | POA: Insufficient documentation

## 2011-07-03 DIAGNOSIS — G8929 Other chronic pain: Secondary | ICD-10-CM | POA: Insufficient documentation

## 2011-07-03 MED ORDER — IBUPROFEN 800 MG PO TABS
800.0000 mg | ORAL_TABLET | Freq: Once | ORAL | Status: DC
  Filled 2011-07-03: qty 1

## 2011-07-03 MED ORDER — OXYCODONE-ACETAMINOPHEN 5-325 MG PO TABS: 1.0000 | ORAL_TABLET | ORAL | Status: AC | PRN

## 2011-07-03 NOTE — Discharge Instructions (Signed)
Keep appointment with your orthopedist on the 30th. Wear the knee immobilizer and crutches for comfort. Elevate ice and a knee intermittently. Take the Percocet as needed for pain do not drive with this medication.  Return for severe pain or any other concerns.  Arthralgia Your caregiver has diagnosed you as suffering from an arthralgia. Arthralgia means there is pain in a joint. This can come from many reasons including:  Bruising the joint which causes soreness (inflammation) in the joint.   Wear and tear on the joints which occur as we grow older (osteoarthritis).   Overusing the joint.   Various forms of arthritis.   Infections of the joint.  Regardless of the cause of pain in your joint, most of these different pains respond to anti-inflammatory drugs and rest. The exception to this is when a joint is infected, and these cases are treated with antibiotics, if it is a bacterial infection. HOME CARE INSTRUCTIONS   Rest the injured area for as long as directed by your caregiver. Then slowly start using the joint as directed by your caregiver and as the pain allows. Crutches as directed may be useful if the ankles, knees or hips are involved. If the knee was splinted or casted, continue use and care as directed. If an stretchy or elastic wrapping bandage has been applied today, it should be removed and re-applied every 3 to 4 hours. It should not be applied tightly, but firmly enough to keep swelling down. Watch toes and feet for swelling, bluish discoloration, coldness, numbness or excessive pain. If any of these problems (symptoms) occur, remove the ace bandage and re-apply more loosely. If these symptoms persist, contact your caregiver or return to this location.   For the first 24 hours, keep the injured extremity elevated on pillows while lying down.   Apply ice for 15 to 20 minutes to the sore joint every couple hours while awake for the first half day. Then 3 to 4 times per day for the  first 48 hours. Put the ice in a plastic bag and place a towel between the bag of ice and your skin.   Wear any splinting, casting, elastic bandage applications, or slings as instructed.   Only take over-the-counter or prescription medicines for pain, discomfort, or fever as directed by your caregiver. Do not use aspirin immediately after the injury unless instructed by your physician. Aspirin can cause increased bleeding and bruising of the tissues.   If you were given crutches, continue to use them as instructed and do not resume weight bearing on the sore joint until instructed.  Persistent pain and inability to use the sore joint as directed for more than 2 to 3 days are warning signs indicating that you should see a caregiver for a follow-up visit as soon as possible. Initially, a hairline fracture (break in bone) may not be evident on X-rays. Persistent pain and swelling indicate that further evaluation, non-weight bearing or use of the joint (use of crutches or slings as instructed), or further X-rays are indicated. X-rays may sometimes not show a small fracture until a week or 10 days later. Make a follow-up appointment with your own caregiver or one to whom we have referred you. A radiologist (specialist in reading X-rays) may read your X-rays. Make sure you know how you are to obtain your X-ray results. Do not assume everything is normal if you do not hear from [place]. SEEK MEDICAL CARE IF: Bruising, swelling, or pain increases. SEEK IMMEDIATE MEDICAL CARE  IF:   Your fingers or toes are numb or blue.   The pain is not responding to medications and continues to stay the same or get worse.   The pain in your joint becomes severe.   You develop a fever over 102 F (38.9 C).   It becomes impossible to move or use the joint.  MAKE SURE YOU:   Understand these instructions.   Will watch your condition.   Will get help right away if you are not doing well or get worse.  Document Released:  02/25/2005 Document Revised: 02/14/2011 Document Reviewed: 10/14/2007 [facility] Patient Information 2012 [place], [place].Arthralgia Your caregiver has diagnosed you as suffering from an arthralgia. Arthralgia means there is pain in a joint. This can come from many reasons including:  Bruising the joint which causes soreness (inflammation) in the joint.   Wear and tear on the joints which occur as we grow older (osteoarthritis).   Overusing the joint.   Various forms of arthritis.   Infections of the joint.  Regardless of the cause of pain in your joint, most of these different pains respond to anti-inflammatory drugs and rest. The exception to this is when a joint is infected, and these cases are treated with antibiotics, if it is a bacterial infection. HOME CARE INSTRUCTIONS   Rest the injured area for as long as directed by your caregiver. Then slowly start using the joint as directed by your caregiver and as the pain allows. Crutches as directed may be useful if the ankles, knees or hips are involved. If the knee was splinted or casted, continue use and care as directed. If an stretchy or elastic wrapping bandage has been applied today, it should be removed and re-applied every 3 to 4 hours. It should not be applied tightly, but firmly enough to keep swelling down. Watch toes and feet for swelling, bluish discoloration, coldness, numbness or excessive pain. If any of these problems (symptoms) occur, remove the ace bandage and re-apply more loosely. If these symptoms persist, contact your caregiver or return to this location.   For the first 24 hours, keep the injured extremity elevated on pillows while lying down.   Apply ice for 15 to 20 minutes to the sore joint every couple hours while awake for the first half day. Then 3 to 4 times per day for the first 48 hours. Put the ice in a plastic bag and place a towel between the bag of ice and your skin.   Wear any splinting, casting, elastic  bandage applications, or slings as instructed.   Only take over-the-counter or prescription medicines for pain, discomfort, or fever as directed by your caregiver. Do not use aspirin immediately after the injury unless instructed by your physician. Aspirin can cause increased bleeding and bruising of the tissues.   If you were given crutches, continue to use them as instructed and do not resume weight bearing on the sore joint until instructed.  Persistent pain and inability to use the sore joint as directed for more than 2 to 3 days are warning signs indicating that you should see a caregiver for a follow-up visit as soon as possible. Initially, a hairline fracture (break in bone) may not be evident on X-rays. Persistent pain and swelling indicate that further evaluation, non-weight bearing or use of the joint (use of crutches or slings as instructed), or further X-rays are indicated. X-rays may sometimes not show a small fracture until a week or 10 days later. Make a  follow-up appointment with your own caregiver or one to whom we have referred you. A radiologist (specialist in reading X-rays) may read your X-rays. Make sure you know how you are to obtain your X-ray results. Do not assume everything is normal if you do not hear from [place]. SEEK MEDICAL CARE IF: Bruising, swelling, or pain increases. SEEK IMMEDIATE MEDICAL CARE IF:   Your fingers or toes are numb or blue.   The pain is not responding to medications and continues to stay the same or get worse.   The pain in your joint becomes severe.   You develop a fever over 102 F (38.9 C).   It becomes impossible to move or use the joint.  MAKE SURE YOU:   Understand these instructions.   Will watch your condition.   Will get help right away if you are not doing well or get worse.  Document Released: 02/25/2005 Document Revised: 02/14/2011 Document Reviewed: 10/14/2007 [facility] Patient Information 2012 [place], [place].Arthralgia Your  caregiver has diagnosed you as suffering from an arthralgia. Arthralgia means there is pain in a joint. This can come from many reasons including:  Bruising the joint which causes soreness (inflammation) in the joint.   Wear and tear on the joints which occur as we grow older (osteoarthritis).   Overusing the joint.   Various forms of arthritis.   Infections of the joint.  Regardless of the cause of pain in your joint, most of these different pains respond to anti-inflammatory drugs and rest. The exception to this is when a joint is infected, and these cases are treated with antibiotics, if it is a bacterial infection. HOME CARE INSTRUCTIONS   Rest the injured area for as long as directed by your caregiver. Then slowly start using the joint as directed by your caregiver and as the pain allows. Crutches as directed may be useful if the ankles, knees or hips are involved. If the knee was splinted or casted, continue use and care as directed. If an stretchy or elastic wrapping bandage has been applied today, it should be removed and re-applied every 3 to 4 hours. It should not be applied tightly, but firmly enough to keep swelling down. Watch toes and feet for swelling, bluish discoloration, coldness, numbness or excessive pain. If any of these problems (symptoms) occur, remove the ace bandage and re-apply more loosely. If these symptoms persist, contact your caregiver or return to this location.   For the first 24 hours, keep the injured extremity elevated on pillows while lying down.   Apply ice for 15 to 20 minutes to the sore joint every couple hours while awake for the first half day. Then 3 to 4 times per day for the first 48 hours. Put the ice in a plastic bag and place a towel between the bag of ice and your skin.   Wear any splinting, casting, elastic bandage applications, or slings as instructed.   Only take over-the-counter or prescription medicines for pain, discomfort, or fever as  directed by your caregiver. Do not use aspirin immediately after the injury unless instructed by your physician. Aspirin can cause increased bleeding and bruising of the tissues.   If you were given crutches, continue to use them as instructed and do not resume weight bearing on the sore joint until instructed.  Persistent pain and inability to use the sore joint as directed for more than 2 to 3 days are warning signs indicating that you should see a caregiver for a follow-up  visit as soon as possible. Initially, a hairline fracture (break in bone) may not be evident on X-rays. Persistent pain and swelling indicate that further evaluation, non-weight bearing or use of the joint (use of crutches or slings as instructed), or further X-rays are indicated. X-rays may sometimes not show a small fracture until a week or 10 days later. Make a follow-up appointment with your own caregiver or one to whom we have referred you. A radiologist (specialist in reading X-rays) may read your X-rays. Make sure you know how you are to obtain your X-ray results. Do not assume everything is normal if you do not hear from [place]. SEEK MEDICAL CARE IF: Bruising, swelling, or pain increases. SEEK IMMEDIATE MEDICAL CARE IF:   Your fingers or toes are numb or blue.   The pain is not responding to medications and continues to stay the same or get worse.   The pain in your joint becomes severe.   You develop a fever over 102 F (38.9 C).   It becomes impossible to move or use the joint.  MAKE SURE YOU:   Understand these instructions.   Will watch your condition.   Will get help right away if you are not doing well or get worse.  Document Released: 02/25/2005 Document Revised: 02/14/2011 Document Reviewed: 10/14/2007 [facility] Patient Information 2012 [place], [place].Knee Pain The knee is the complex joint between your thigh and your lower leg. It is made up of bones, tendons, ligaments, and cartilage. The bones that make  up the knee are:  The femur in the thigh.   The tibia and fibula in the lower leg.   The patella or kneecap riding in the groove on the lower femur.  CAUSES  Knee pain is a common complaint with many causes. A few of these causes are:  Injury, such as:   A ruptured ligament or tendon injury.   Torn cartilage.   Medical conditions, such as:   Gout   Arthritis   Infections   Overuse, over training or overdoing a physical activity.  Knee pain can be minor or severe. Knee pain can accompany debilitating injury. Minor knee problems often respond well to self-care measures or get well on their own. More serious injuries may need medical intervention or even surgery. SYMPTOMS The knee is complex. Symptoms of knee problems can vary widely. Some of the problems are:  Pain with movement and weight bearing.   Swelling and tenderness.   Buckling of the knee.   Inability to straighten or extend your knee.   Your knee locks and you cannot straighten it.   Warmth and redness with pain and fever.   Deformity or dislocation of the kneecap.  DIAGNOSIS  Determining what is wrong may be very straight forward such as when there is an injury. It can also be challenging because of the complexity of the knee. Tests to make a diagnosis may include:  Your caregiver taking a history and doing a physical exam.   Routine X-rays can be used to rule out other problems. X-rays will not reveal a cartilage tear. Some injuries of the knee can be diagnosed by:   Arthroscopy a surgical technique by which a small video camera is inserted through tiny incisions on the sides of the knee. This procedure is used to examine and repair internal knee joint problems. Tiny instruments can be used during arthroscopy to repair the torn knee cartilage (meniscus).   Arthrography is a radiology technique. A contrast liquid is  directly injected into the knee joint. Internal structures of the knee joint then become  visible on X-ray film.   An MRI scan is a non x-ray radiology procedure in which magnetic fields and a computer produce two- or three-dimensional images of the inside of the knee. Cartilage tears are often visible using an MRI scanner. MRI scans have largely replaced arthrography in diagnosing cartilage tears of the knee.   Blood work.   Examination of the fluid that helps to lubricate the knee joint (synovial fluid). This is done by taking a sample out using a needle and a syringe.  TREATMENT The treatment of knee problems depends on the cause. Some of these treatments are:  Depending on the injury, proper casting, splinting, surgery or physical therapy care will be needed.   Give yourself adequate recovery time. Do not overuse your joints. If you begin to get sore during workout routines, back off. Slow down or do fewer repetitions.   For repetitive activities such as cycling or running, maintain your strength and nutrition.   Alternate muscle groups. For example if you are a weight lifter, work the upper body on one day and the lower body the next.   Either tight or weak muscles do not give the proper support for your knee. Tight or weak muscles do not absorb the stress placed on the knee joint. Keep the muscles surrounding the knee strong.   Take care of mechanical problems.   If you have flat feet, orthotics or special shoes may help. See your caregiver if you need help.   Arch supports, sometimes with wedges on the inner or outer aspect of the heel, can help. These can shift pressure away from the side of the knee most bothered by osteoarthritis.   A brace called an "unloader" brace also may be used to help ease the pressure on the most arthritic side of the knee.   If your caregiver has prescribed crutches, braces, wraps or ice, use as directed. The acronym for this is PRICE. This means protection, rest, ice, compression and elevation.   Nonsteroidal anti-inflammatory drugs  (NSAID's), can help relieve pain. But if taken immediately after an injury, they may actually increase swelling. Take NSAID's with food in your stomach. Stop them if you develop stomach problems. Do not take these if you have a history of ulcers, stomach pain or bleeding from the bowel. Do not take without your caregiver's approval if you have problems with fluid retention, heart failure, or kidney problems.   For ongoing knee problems, physical therapy may be helpful.   Glucosamine and chondroitin are over-the-counter dietary supplements. Both may help relieve the pain of osteoarthritis in the knee. These medicines are different from the usual anti-inflammatory drugs. Glucosamine may decrease the rate of cartilage destruction.   Injections of a corticosteroid drug into your knee joint may help reduce the symptoms of an arthritis flare-up. They may provide pain relief that lasts a few months. You may have to wait a few months between injections. The injections do have a small increased risk of infection, water retention and elevated blood sugar levels.   Hyaluronic acid injected into damaged joints may ease pain and provide lubrication. These injections may work by reducing inflammation. A series of shots may give relief for as long as 6 months.   Topical painkillers. Applying certain ointments to your skin may help relieve the pain and stiffness of osteoarthritis. Ask your pharmacist for suggestions. Many over the-counter products are approved for  temporary relief of arthritis pain.   In some countries, doctors often prescribe topical NSAID's for relief of chronic conditions such as arthritis and tendinitis. A review of treatment with NSAID creams found that they worked as well as oral medications but without the serious side effects.  PREVENTION  Maintain a healthy weight. Extra pounds put more strain on your joints.   Get strong, stay limber. Weak muscles are a common cause of knee injuries.  Stretching is important. Include flexibility exercises in your workouts.   Be smart about exercise. If you have osteoarthritis, chronic knee pain or recurring injuries, you may need to change the way you exercise. This does not mean you have to stop being active. If your knees ache after jogging or playing basketball, consider switching to swimming, water aerobics or other low-impact activities, at least for a few days a week. Sometimes limiting high-impact activities will provide relief.   Make sure your shoes fit well. Choose footwear that is right for your sport.   Protect your knees. Use the proper gear for knee-sensitive activities. Use kneepads when playing volleyball or laying carpet. Buckle your seat belt every time you drive. Most shattered kneecaps occur in car accidents.   Rest when you are tired.  SEEK MEDICAL CARE IF:  You have knee pain that is continual and does not seem to be getting better.  SEEK IMMEDIATE MEDICAL CARE IF:  Your knee joint feels hot to the touch and you have a high fever. MAKE SURE YOU:   Understand these instructions.   Will watch your condition.   Will get help right away if you are not doing well or get worse.  Document Released: 12/23/2006 Document Revised: 02/14/2011 Document Reviewed: 12/23/2006 [facility] Patient Information 2012 [place], [place].Knee Pain The knee is the complex joint between your thigh and your lower leg. It is made up of bones, tendons, ligaments, and cartilage. The bones that make up the knee are:  The femur in the thigh.   The tibia and fibula in the lower leg.   The patella or kneecap riding in the groove on the lower femur.  CAUSES  Knee pain is a common complaint with many causes. A few of these causes are:  Injury, such as:   A ruptured ligament or tendon injury.   Torn cartilage.   Medical conditions, such as:   Gout   Arthritis   Infections   Overuse, over training or overdoing a physical activity.  Knee  pain can be minor or severe. Knee pain can accompany debilitating injury. Minor knee problems often respond well to self-care measures or get well on their own. More serious injuries may need medical intervention or even surgery. SYMPTOMS The knee is complex. Symptoms of knee problems can vary widely. Some of the problems are:  Pain with movement and weight bearing.   Swelling and tenderness.   Buckling of the knee.   Inability to straighten or extend your knee.   Your knee locks and you cannot straighten it.   Warmth and redness with pain and fever.   Deformity or dislocation of the kneecap.  DIAGNOSIS  Determining what is wrong may be very straight forward such as when there is an injury. It can also be challenging because of the complexity of the knee. Tests to make a diagnosis may include:  Your caregiver taking a history and doing a physical exam.   Routine X-rays can be used to rule out other problems. X-rays will not reveal a  cartilage tear. Some injuries of the knee can be diagnosed by:   Arthroscopy a surgical technique by which a small video camera is inserted through tiny incisions on the sides of the knee. This procedure is used to examine and repair internal knee joint problems. Tiny instruments can be used during arthroscopy to repair the torn knee cartilage (meniscus).   Arthrography is a radiology technique. A contrast liquid is directly injected into the knee joint. Internal structures of the knee joint then become visible on X-ray film.   An MRI scan is a non x-ray radiology procedure in which magnetic fields and a computer produce two- or three-dimensional images of the inside of the knee. Cartilage tears are often visible using an MRI scanner. MRI scans have largely replaced arthrography in diagnosing cartilage tears of the knee.   Blood work.   Examination of the fluid that helps to lubricate the knee joint (synovial fluid). This is done by taking a sample out  using a needle and a syringe.  TREATMENT The treatment of knee problems depends on the cause. Some of these treatments are:  Depending on the injury, proper casting, splinting, surgery or physical therapy care will be needed.   Give yourself adequate recovery time. Do not overuse your joints. If you begin to get sore during workout routines, back off. Slow down or do fewer repetitions.   For repetitive activities such as cycling or running, maintain your strength and nutrition.   Alternate muscle groups. For example if you are a weight lifter, work the upper body on one day and the lower body the next.   Either tight or weak muscles do not give the proper support for your knee. Tight or weak muscles do not absorb the stress placed on the knee joint. Keep the muscles surrounding the knee strong.   Take care of mechanical problems.   If you have flat feet, orthotics or special shoes may help. See your caregiver if you need help.   Arch supports, sometimes with wedges on the inner or outer aspect of the heel, can help. These can shift pressure away from the side of the knee most bothered by osteoarthritis.   A brace called an "unloader" brace also may be used to help ease the pressure on the most arthritic side of the knee.   If your caregiver has prescribed crutches, braces, wraps or ice, use as directed. The acronym for this is PRICE. This means protection, rest, ice, compression and elevation.   Nonsteroidal anti-inflammatory drugs (NSAID's), can help relieve pain. But if taken immediately after an injury, they may actually increase swelling. Take NSAID's with food in your stomach. Stop them if you develop stomach problems. Do not take these if you have a history of ulcers, stomach pain or bleeding from the bowel. Do not take without your caregiver's approval if you have problems with fluid retention, heart failure, or kidney problems.   For ongoing knee problems, physical therapy may be  helpful.   Glucosamine and chondroitin are over-the-counter dietary supplements. Both may help relieve the pain of osteoarthritis in the knee. These medicines are different from the usual anti-inflammatory drugs. Glucosamine may decrease the rate of cartilage destruction.   Injections of a corticosteroid drug into your knee joint may help reduce the symptoms of an arthritis flare-up. They may provide pain relief that lasts a few months. You may have to wait a few months between injections. The injections do have a small increased risk of infection, water  retention and elevated blood sugar levels.   Hyaluronic acid injected into damaged joints may ease pain and provide lubrication. These injections may work by reducing inflammation. A series of shots may give relief for as long as 6 months.   Topical painkillers. Applying certain ointments to your skin may help relieve the pain and stiffness of osteoarthritis. Ask your pharmacist for suggestions. Many over the-counter products are approved for temporary relief of arthritis pain.   In some countries, doctors often prescribe topical NSAID's for relief of chronic conditions such as arthritis and tendinitis. A review of treatment with NSAID creams found that they worked as well as oral medications but without the serious side effects.  PREVENTION  Maintain a healthy weight. Extra pounds put more strain on your joints.   Get strong, stay limber. Weak muscles are a common cause of knee injuries. Stretching is important. Include flexibility exercises in your workouts.   Be smart about exercise. If you have osteoarthritis, chronic knee pain or recurring injuries, you may need to change the way you exercise. This does not mean you have to stop being active. If your knees ache after jogging or playing basketball, consider switching to swimming, water aerobics or other low-impact activities, at least for a few days a week. Sometimes limiting high-impact  activities will provide relief.   Make sure your shoes fit well. Choose footwear that is right for your sport.   Protect your knees. Use the proper gear for knee-sensitive activities. Use kneepads when playing volleyball or laying carpet. Buckle your seat belt every time you drive. Most shattered kneecaps occur in car accidents.   Rest when you are tired.  SEEK MEDICAL CARE IF:  You have knee pain that is continual and does not seem to be getting better.  SEEK IMMEDIATE MEDICAL CARE IF:  Your knee joint feels hot to the touch and you have a high fever. MAKE SURE YOU:   Understand these instructions.   Will watch your condition.   Will get help right away if you are not doing well or get worse.  Document Released: 12/23/2006 Document Revised: 02/14/2011 Document Reviewed: 12/23/2006 [facility] Patient Information 2012 [place], [place].

## 2011-07-03 NOTE — ED Notes (Signed)
Pt is very upset with wait time states " i have been here for 2.5 hours and this is ridiculous" pt states she has to pick up her children from [employer] college and she does not want the motrin because it will put her to sleep. Pt also refusing crutches and knee immobilizer. NP Thurston Hole notified and aware.

## 2011-07-03 NOTE — ED Notes (Signed)
Pt presenting to ed with c/o left knee pain x 2 month with worsening pain in the past 2 weeks. Pt states she injured her knee x 2 months ago and she can't see orthopedist until 4/30. Pt is alert and oriented at this time.

## 2011-07-05 NOTE — ED Provider Notes (Signed)
Medical screening examination/treatment/procedure(s) were performed by non-physician practitioner and as supervising physician I was immediately available for consultation/collaboration.  Jawanda Passey T Cammi Consalvo, MD 07/05/11 1804 

## 2011-07-05 NOTE — ED Provider Notes (Signed)
History     CSN: [id number]  Arrival date & time 07/03/11  1012   First MD Initiated Contact with Patient 07/03/11 1248      Chief Complaint  Patient presents with  . Knee Pain    (Consider location/radiation/quality/duration/timing/severity/associated sxs/prior treatment) Patient is a 46 y.o. female presenting with knee pain. The history is provided by the patient. No language interpreter was used.  Knee Pain This is a chronic problem. The current episode started 1 to 4 weeks ago. The problem occurs daily. The problem has been gradually worsening. Associated symptoms include arthralgias. Pertinent negatives include no fever, joint swelling, nausea, numbness or vomiting. The symptoms are aggravated by walking and bending. She has tried NSAIDs for the symptoms. The treatment provided moderate relief.  L knee pain x > 1 month after injury.  Prior films - for fracture.  Ortho appointment on 07/09/11.  Prior R knee surgery.   Past Medical History  Diagnosis Date  . Arthritis   . Back pain     Past Surgical History  Procedure Date  . Cesarean section   . Knee surgery   . Knee surgery     R knee    No family history on file.  History  Substance Use Topics  . Smoking status: Current Some Day Smoker -- 0.5 packs/day for 10 years    Types: Cigarettes  . Smokeless tobacco: Never Used  . Alcohol Use: Yes     socially    OB History    Grav Para Term Preterm Abortions TAB SAB Ect Mult Living                  Review of Systems  Constitutional: Negative.  Negative for fever.  HENT: Negative.   Eyes: Negative.   Respiratory: Negative.   Cardiovascular: Negative.   Gastrointestinal: Negative.  Negative for nausea and vomiting.  Musculoskeletal: Positive for arthralgias. Negative for joint swelling.  Neurological: Negative.  Negative for numbness.  Psychiatric/Behavioral: Negative.   All other systems reviewed and are negative.    Allergies  Pyridium; Sulfa antibiotics;  Tramadol; and Voltaren  Home Medications   Current Outpatient Rx  Name Route Sig Dispense Refill  . GOODY HEADACHE PO Oral Take 1 packet by mouth every 6 (six) hours as needed. HEADACHE    . CLONAZEPAM 0.5 MG PO TABS Oral Take 0.5 mg by mouth 3 (three) times daily as needed. For anxiety    . GLUCOSAMINE-CHONDROITIN 500-400 MG PO TABS Oral Take 1 tablet by mouth 3 (three) times daily.    . IBUPROFEN 200 MG PO TABS Oral Take 800 mg by mouth every 6 (six) hours as needed. PAIN    . ADULT MULTIVITAMIN W/MINERALS CH Oral Take 1 tablet by mouth daily.    . OXYCODONE-ACETAMINOPHEN 5-325 MG PO TABS Oral Take 1 tablet by mouth every 4 (four) hours as needed for pain. 12 tablet 0    BP 118/61  Pulse 80  Temp(Src) 98.1 F (36.7 C) (Oral)  Resp 15  SpO2 98%  LMP 06/15/2011  Physical Exam  Nursing note and vitals reviewed. Constitutional: She is oriented to person, place, and time. She appears well-developed and well-nourished.  HENT:  Head: Normocephalic.  Eyes: Conjunctivae and EOM are normal. Pupils are equal, round, and reactive to light.  Neck: Normal range of motion. Neck supple.  Cardiovascular: Normal rate.   Pulmonary/Chest: Effort normal.  Abdominal: Soft.  Musculoskeletal: She exhibits tenderness. She exhibits no edema.  L knee pain tenderness with palpatation and internal rotation of foot.  No erythema or edema noted.  Cool to touch  Neurological: She is alert and oriented to person, place, and time.  Skin: Skin is warm and dry.  Psychiatric: She has a normal mood and affect.    ED Course  Procedures (including critical care time)  Labs Reviewed - No data to display No results found.   1. Chronic knee pain       MDM   L knee pain with prior films.  Continue ibuprofen and I added a few percocet, knee immobilizer and crutches.  Follow up appointment 4/30 with ortho.  No signs of infection.       Remi Haggard, NP 07/05/11 1212

## 2011-09-15 ENCOUNTER — Emergency Department (HOSPITAL_BASED_OUTPATIENT_CLINIC_OR_DEPARTMENT_OTHER)
Admission: EM | Admit: 2011-09-15 | Discharge: 2011-09-15 | Disposition: A | Discharge status: Home / Self Care | Payer: Medicaid Other | Attending: Emergency Medicine | Admitting: Emergency Medicine

## 2011-09-15 ENCOUNTER — Encounter (HOSPITAL_BASED_OUTPATIENT_CLINIC_OR_DEPARTMENT_OTHER): Payer: Self-pay | Admitting: *Deleted

## 2011-09-15 DIAGNOSIS — K029 Dental caries, unspecified: Secondary | ICD-10-CM | POA: Insufficient documentation

## 2011-09-15 DIAGNOSIS — F17200 Nicotine dependence, unspecified, uncomplicated: Secondary | ICD-10-CM | POA: Insufficient documentation

## 2011-09-15 DIAGNOSIS — M129 Arthropathy, unspecified: Secondary | ICD-10-CM | POA: Insufficient documentation

## 2011-09-15 MED ORDER — PENICILLIN V POTASSIUM 500 MG PO TABS: 500.0000 mg | ORAL_TABLET | Freq: Four times a day (QID) | ORAL | Status: AC

## 2011-09-15 MED ORDER — HYDROCODONE-ACETAMINOPHEN 5-325 MG PO TABS
2.0000 | ORAL_TABLET | Freq: Once | ORAL | Status: DC
  Filled 2011-09-15 (×2): qty 2

## 2011-09-15 MED ORDER — PENICILLIN V POTASSIUM 250 MG PO TABS
500.0000 mg | ORAL_TABLET | Freq: Once | ORAL | Status: AC
  Administered 2011-09-15: 500 mg via ORAL
  Filled 2011-09-15 (×2): qty 2

## 2011-09-15 MED ORDER — HYDROCODONE-ACETAMINOPHEN 5-325 MG PO TABS: 1.0000 | ORAL_TABLET | Freq: Four times a day (QID) | ORAL | Status: AC | PRN

## 2011-09-15 NOTE — ED Provider Notes (Signed)
History   This chart was scribed for Terri Numbers, MD by Charolett Bumpers . The patient was seen in room MH02/MH02.    CSN: [id number]  Arrival date & time 09/15/11  1904   First MD Initiated Contact with Patient 09/15/11 1910      Chief Complaint  Patient presents with  . Dental Pain    (Consider location/radiation/quality/duration/timing/severity/associated sxs/prior treatment) HPI Terri White is a 46 y.o. female who presents to the Emergency Department complaining of constant, moderate left upper tooth pain since last night. Patient states that she was eating a cracker when the pain began last night. Patient states that she has been to the Dr. Warren Danes for her h/o dental problems previously but has not been seen for her current dental pain. Patient reports associated nausea. Patient denies any fevers or vomiting. Patient states that she has taken Motrin at home with no relief. Patient reports a h/o dental caries and dental abscesses. Patient denies any h/o Diabetes.   Past Medical History  Diagnosis Date  . Arthritis   . Back pain     Past Surgical History  Procedure Date  . Cesarean section   . Knee surgery   . Knee surgery     R knee    History reviewed. No pertinent family history.  History  Substance Use Topics  . Smoking status: Current Some Day Smoker -- 0.5 packs/day for 10 years    Types: Cigarettes  . Smokeless tobacco: Never Used  . Alcohol Use: Yes     socially    OB History    Grav Para Term Preterm Abortions TAB SAB Ect Mult Living                  Review of Systems  Constitutional: Negative for fever.  HENT: Positive for dental problem.   Eyes: Negative.   Respiratory: Negative.   Cardiovascular: Negative.   Gastrointestinal: Positive for nausea. Negative for vomiting.  Genitourinary: Negative.   Musculoskeletal: Negative.   Skin: Negative.   Neurological: Negative.   Hematological: Negative.   Psychiatric/Behavioral: Negative.     All other systems reviewed and are negative.    Allergies  Diclofenac sodium; Pyridium; Sulfa antibiotics; and Tramadol  Home Medications   Current Outpatient Rx  Name Route Sig Dispense Refill  . CLONAZEPAM 0.5 MG PO TABS Oral Take 0.5 mg by mouth 3 (three) times daily as needed. For anxiety    . IBUPROFEN 200 MG PO TABS Oral Take 800 mg by mouth every 6 (six) hours as needed. PAIN    . GOODY HEADACHE PO Oral Take 1 packet by mouth every 6 (six) hours as needed. HEADACHE    . GLUCOSAMINE-CHONDROITIN 500-400 MG PO TABS Oral Take 1 tablet by mouth 3 (three) times daily.    . ADULT MULTIVITAMIN W/MINERALS CH Oral Take 1 tablet by mouth daily.      BP 118/64  Pulse 92  Temp 98.1 F (36.7 C) (Oral)  Resp 19  Ht 5\' 2"  (1.575 m)  Wt 136 lb (61.689 kg)  BMI 24.87 kg/m2  SpO2 99%  Physical Exam  Nursing note and vitals reviewed. GEN: Well-developed, well-nourished female in no distress HEENT: Atraumatic, normocephalic. Oropharynx clear without erythema. No buccal space or sublingual swelling. Patient has obvious dental caries in the left upper quadrant. EYES: PERRLA BL, no scleral icterus. NECK: Trachea midline, no meningismus CV: regular rate and rhythm.  PULM: No respiratory distress.   Neuro: cranial nerves grossly 2-12 intact, no  abnormalities of strength or sensation, A and O x 3 MSK: Patient moves all 4 extremities symmetrically, no deformity, edema, or injury noted Skin: No rashes petechiae, purpura, or jaundice Psych: no abnormality of mood   ED Course  Procedures (including critical care time)  DIAGNOSTIC STUDIES: Oxygen Saturation is 99% on room air, normal by my interpretation.    COORDINATION OF CARE:  1913: Discussed planned course of treatment with the patient, who is agreeable at this time. Discussed f/u with dentist on call.  1930: Medication Orders: Hydrocodone-acetaminophen (Norco) 5-325 mg per tablet 2 tablet-once; Penicillin v potassium (Veetid) tablet  500 mg-once.     Labs Reviewed - No data to display No results found.   1. Dental caries       MDM  Patient was evaluated by myself. Patient had evidence of dental caries and no obvious. Abscess. She was given a dose of Penicillin VK here. She was discharged a seven-day supply. Patient was told that she can followup with the on-call dentist or Dr. Warren Danes. She was given a dose of 2 pills of Vicodin and discharged with 6 tablet for this. Patient was told that if she follows up with the on-call dentist she must call within 48 hours and that there is no upright fever this. Patient had no signs of airway compromise or systemic infection. She was discharged in good condition.  I personally performed the services described in this documentation, which was scribed in my presence. The recorded information has been reviewed and considered.          Terri Numbers, MD 09/15/11 2033

## 2011-09-15 NOTE — ED Notes (Signed)
C/o left upper tooth pain since last night

## 2011-10-13 ENCOUNTER — Encounter (HOSPITAL_BASED_OUTPATIENT_CLINIC_OR_DEPARTMENT_OTHER): Payer: Self-pay | Admitting: *Deleted

## 2011-10-13 ENCOUNTER — Emergency Department (HOSPITAL_BASED_OUTPATIENT_CLINIC_OR_DEPARTMENT_OTHER)
Admission: EM | Admit: 2011-10-13 | Discharge: 2011-10-13 | Disposition: A | Discharge status: Home / Self Care | Payer: Medicaid Other | Attending: Emergency Medicine | Admitting: Emergency Medicine

## 2011-10-13 ENCOUNTER — Emergency Department (HOSPITAL_BASED_OUTPATIENT_CLINIC_OR_DEPARTMENT_OTHER): Payer: Medicaid Other

## 2011-10-13 DIAGNOSIS — F17200 Nicotine dependence, unspecified, uncomplicated: Secondary | ICD-10-CM | POA: Insufficient documentation

## 2011-10-13 DIAGNOSIS — W1809XA Striking against other object with subsequent fall, initial encounter: Secondary | ICD-10-CM | POA: Insufficient documentation

## 2011-10-13 DIAGNOSIS — M545 Low back pain, unspecified: Secondary | ICD-10-CM | POA: Insufficient documentation

## 2011-10-13 DIAGNOSIS — W19XXXA Unspecified fall, initial encounter: Secondary | ICD-10-CM

## 2011-10-13 MED ORDER — HYDROCODONE-ACETAMINOPHEN 5-325 MG PO TABS
2.0000 | ORAL_TABLET | Freq: Once | ORAL | Status: AC
  Administered 2011-10-13: 2 via ORAL
  Filled 2011-10-13: qty 2

## 2011-10-13 MED ORDER — HYDROCODONE-ACETAMINOPHEN 5-500 MG PO TABS: 1.0000 | ORAL_TABLET | Freq: Four times a day (QID) | ORAL | Status: AC | PRN

## 2011-10-13 NOTE — ED Notes (Signed)
Larey Seat off a ladder yesterday and now her "back is killing her"

## 2011-10-13 NOTE — ED Provider Notes (Signed)
History     CSN: [id number]  Arrival date & time 10/13/11  1100   First MD Initiated Contact with Patient 10/13/11 1136      Chief Complaint  Patient presents with  . Fall    (Consider location/radiation/quality/duration/timing/severity/associated sxs/prior treatment) HPI Comments: Patient states was on a ladder helping her mother clean off the roof when she fell onto and "straddled" the ladder.  She did not fall from a height, but since that time her back has been hurting.  She denies bowel or bladder complaints.  She reports radiation of her pain into the left leg.  She also tells me her doctor is out of town until after labor day and is requesting a month's prescription for vivance.  Patient is a 46 y.o. female presenting with fall. The history is provided by the patient.  Fall The accident occurred yesterday. Incident: onto a ladder. She fell from a height of 1 to 2 ft. There was no blood loss.    Past Medical History  Diagnosis Date  . Arthritis   . Back pain     Past Surgical History  Procedure Date  . Cesarean section   . Knee surgery   . Knee surgery     R knee    No family history on file.  History  Substance Use Topics  . Smoking status: Current Some Day Smoker -- 0.5 packs/day for 10 years    Types: Cigarettes  . Smokeless tobacco: Never Used  . Alcohol Use: Yes     socially    OB History    Grav Para Term Preterm Abortions TAB SAB Ect Mult Living                  Review of Systems  All other systems reviewed and are negative.    Allergies  Diclofenac sodium; Pyridium; Sulfa antibiotics; and Tramadol  Home Medications   Current Outpatient Rx  Name Route Sig Dispense Refill  . GOODY HEADACHE PO Oral Take 1 packet by mouth every 6 (six) hours as needed. HEADACHE    . CLONAZEPAM 0.5 MG PO TABS Oral Take 0.5 mg by mouth 3 (three) times daily as needed. For anxiety    . GLUCOSAMINE-CHONDROITIN 500-400 MG PO TABS Oral Take 1 tablet by mouth 3  (three) times daily.    . IBUPROFEN 200 MG PO TABS Oral Take 800 mg by mouth every 6 (six) hours as needed. PAIN    . ADULT MULTIVITAMIN W/MINERALS CH Oral Take 1 tablet by mouth daily.      BP 110/62  Pulse 91  Temp 98 F (36.7 C) (Oral)  Resp 20  SpO2 100%  Physical Exam  Nursing note and vitals reviewed. Constitutional: She is oriented to person, place, and time. She appears well-developed and well-nourished. No distress.  HENT:  Head: Normocephalic and atraumatic.  Neck: Normal range of motion. Neck supple.  Musculoskeletal:       There is ttp in the lumbar region with no bony ttp or stepoffs.  Neurological: She is alert and oriented to person, place, and time.  Skin: Skin is warm and dry. She is not diaphoretic.    ED Course  Procedures (including critical care time)  Labs Reviewed - No data to display No results found.   No diagnosis found.    MDM  This patient has been to the ER multiple times for falls, toothaches, and other painful conditions.  I suspect there is some drug seeking behavior.  Today, she asked me to give a month's supply of vivance (which I have refused to do) as her doctor is out of town.  I will reluctantly prescribe her a small amount of hydrocodone but will be unlikely to do so in the future.  I have explained this to her.          Geoffery Lyons, MD 10/13/11 1218

## 2011-12-12 ENCOUNTER — Emergency Department (HOSPITAL_COMMUNITY)
Admission: EM | Admit: 2011-12-12 | Discharge: 2011-12-13 | Disposition: A | Discharge status: Home / Self Care | Payer: Medicaid Other | Attending: Emergency Medicine | Admitting: Emergency Medicine

## 2011-12-12 DIAGNOSIS — R0989 Other specified symptoms and signs involving the circulatory and respiratory systems: Secondary | ICD-10-CM | POA: Insufficient documentation

## 2011-12-12 DIAGNOSIS — R05 Cough: Secondary | ICD-10-CM | POA: Insufficient documentation

## 2011-12-12 DIAGNOSIS — R51 Headache: Secondary | ICD-10-CM | POA: Insufficient documentation

## 2011-12-12 DIAGNOSIS — J40 Bronchitis, not specified as acute or chronic: Secondary | ICD-10-CM | POA: Insufficient documentation

## 2011-12-12 DIAGNOSIS — R059 Cough, unspecified: Secondary | ICD-10-CM | POA: Insufficient documentation

## 2011-12-13 ENCOUNTER — Encounter (HOSPITAL_COMMUNITY): Payer: Self-pay

## 2011-12-13 ENCOUNTER — Emergency Department (HOSPITAL_COMMUNITY): Payer: Medicaid Other

## 2011-12-13 MED ORDER — SODIUM CHLORIDE 0.9 % IV BOLUS (SEPSIS)
1000.0000 mL | Freq: Once | INTRAVENOUS | Status: AC
  Administered 2011-12-13: 1000 mL via INTRAVENOUS

## 2011-12-13 MED ORDER — PROCHLORPERAZINE 25 MG RE SUPP: RECTAL | Status: DC

## 2011-12-13 MED ORDER — KETOROLAC TROMETHAMINE 30 MG/ML IJ SOLN
30.0000 mg | Freq: Once | INTRAMUSCULAR | Status: AC
  Administered 2011-12-13: 30 mg via INTRAVENOUS
  Filled 2011-12-13: qty 1

## 2011-12-13 MED ORDER — AZITHROMYCIN 250 MG PO TABS: ORAL_TABLET | ORAL | Status: DC

## 2011-12-13 MED ORDER — DIPHENHYDRAMINE HCL 50 MG/ML IJ SOLN
25.0000 mg | Freq: Once | INTRAMUSCULAR | Status: AC
  Administered 2011-12-13: 50 mg via INTRAVENOUS
  Filled 2011-12-13: qty 1

## 2011-12-13 MED ORDER — SODIUM CHLORIDE 0.9 % IV SOLN: INTRAVENOUS | Status: DC

## 2011-12-13 MED ORDER — METOCLOPRAMIDE HCL 5 MG/ML IJ SOLN
10.0000 mg | Freq: Once | INTRAMUSCULAR | Status: AC
  Administered 2011-12-13: 10 mg via INTRAVENOUS
  Filled 2011-12-13: qty 2

## 2011-12-13 NOTE — ED Provider Notes (Cosign Needed)
History     CSN: [id number]  Arrival date & time 12/12/11  2300   First MD Initiated Contact with Patient 12/13/11 0127      Chief Complaint  Patient presents with  . Generalized Body Aches  . Cough    (Consider location/radiation/quality/duration/timing/severity/associated sxs/prior treatment) HPI  Patient states about 2 days ago she started getting a frontal headache and describes it as a pressure behind her eyes. She denies rhinorrhea, chest pain, or fever today. She states she had fever couple days ago up to 102.7. She states she started having a cough yesterday that is nonproductive. She also states she has wheezing without shortness of breath. She denies any change in her vision and she does have nausea without vomiting. She also describes photophobia. She states this headache is like headache she's had before and she gets them about once a month.  PCP none  Past Medical History  Diagnosis Date  . Arthritis   . Back pain     Past Surgical History  Procedure Date  . Cesarean section   . Knee surgery   . Knee surgery     R knee    History reviewed. No pertinent family history.  History  Substance Use Topics  . Smoking status: Current Some Day Smoker -- 0.5 packs/day for 10 years    Types: Cigarettes  . Smokeless tobacco: Never Used  . 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    Allergies  Diclofenac sodium; Pyridium; Sulfa antibiotics; and Tramadol  Home Medications   Current Outpatient Rx  Name Route Sig Dispense Refill  . GOODY HEADACHE PO Oral Take 1 packet by mouth every 6 (six) hours as needed. HEADACHE    . CLONAZEPAM 0.5 MG PO TABS Oral Take 0.5 mg by mouth 3 (three) times daily as needed. For anxiety    . GLUCOSAMINE-CHONDROITIN 500-400 MG  PO TABS Oral Take 1 tablet by mouth 3 (three) times daily.    . IBUPROFEN 200 MG PO TABS Oral Take 800 mg by mouth every 6 (six) hours as needed. PAIN    . ADULT MULTIVITAMIN W/MINERALS CH Oral Take 1 tablet by mouth daily.      BP 114/57  Pulse 92  Temp 97.7 F (36.5 C) (Oral)  Resp 24  Ht 5\' 3"  (1.6 m)  Wt 130 lb (58.968 kg)  BMI 23.03 kg/m2  SpO2 94%  LMP 11/11/2011  Vital signs normal    Physical Exam  Nursing note and vitals reviewed. Constitutional: She is oriented to person, place, and time. She appears well-developed and well-nourished.  Non-toxic appearance. She does not appear ill. No distress.       Patient is sleeping when I entered the room, she is very hard to keep awake to do her interview.  HENT:  Head: Normocephalic and atraumatic.  Right Ear: External ear normal.  Left Ear: External ear normal.  Nose: Nose normal. No mucosal edema or rhinorrhea.  Mouth/Throat: Oropharynx is clear and moist and mucous membranes are normal. No dental abscesses or uvula swelling.  Eyes: Conjunctivae normal and EOM are normal. Pupils are equal, round, and reactive to light.  Neck: Normal range  of motion and full passive range of motion without pain. Neck supple.  Cardiovascular: Normal rate, regular rhythm and normal heart sounds.  Exam reveals no gallop and no friction rub.   No murmur heard. Pulmonary/Chest: Effort normal and breath sounds normal. No respiratory distress. She has no wheezes. She has no rhonchi. She has no rales. She exhibits no tenderness and no crepitus.       Patient is noted to be coughing frequently with some rattling  Abdominal: Soft. Normal appearance and bowel sounds are normal. She exhibits no distension. There is no tenderness. There is no rebound and no guarding.  Musculoskeletal: Normal range of motion. She exhibits no edema and no tenderness.       Moves all extremities well.   Neurological: She is alert and oriented to person, place, and time. She  has normal strength. No cranial nerve deficit.  Skin: Skin is warm, dry and intact. No rash noted. No erythema. No pallor.  Psychiatric: She has a normal mood and affect. Her speech is normal and behavior is normal. Her mood appears not anxious.    ED Course  Procedures (including critical care time)   Medications  0.9 %  sodium chloride infusion (not administered)  metoCLOPramide (REGLAN) injection 10 mg (10 mg Intravenous Given 12/13/11 0351)  diphenhydrAMINE (BENADRYL) injection 25 mg (50 mg Intravenous Given 12/13/11 0351)  ketorolac (TORADOL) 30 MG/ML injection 30 mg (30 mg Intravenous Given 12/13/11 0351)  sodium chloride 0.9 % bolus 1,000 mL (1000 mL Intravenous Given 12/13/11 0350)   05:30 pt sleeping, when awakened she states her headache is much better.    Dg Chest 2 View  12/13/2011  *RADIOLOGY REPORT*  Clinical Data: Cough, body aches, fever.  CHEST - 2 VIEW  Comparison: 09/12/2009 CT  Findings: There are mild middle lobe and lingular opacities, which are similar to prior and therefore favored to reflect scarring/chronic change.  Otherwise, no confluent airspace opacity, pleural effusion, or pneumothorax.  Cardiomediastinal contours within normal range.  No acute osseous finding.  IMPRESSION: Chronic middle lobe/lingular opacities without definite radiographic evidence of acute cardiopulmonary process.   Original Report Authenticated By: Waneta Martins, M.D.      1. Headache   2. Bronchitis    New Prescriptions   AZITHROMYCIN (ZITHROMAX Z-PAK) 250 MG TABLET    Take 2 po the first day then once a day for the next 4 days.   PROCHLORPERAZINE (COMPAZINE) 25 MG SUPPOSITORY    Unwrap and insert 1 PR BID PRN nausea, vomiting or headache  mucinex OTC   Plan discharge  Devoria Albe, MD, FACEP    MDM          Ward Givens, MD 12/13/11 [phone]

## 2011-12-13 NOTE — ED Notes (Signed)
Patient reports generalized weakness, aches, nausea without vomiting, and fever x2 days. Afebrile at this time.

## 2011-12-13 NOTE — ED Notes (Signed)
D/C INSTRUCTIONS REVIEWED. RX GIVEN AND DISCUSSED.

## 2011-12-13 NOTE — ED Notes (Signed)
Pt states generalized pain.  Pt states cough with yellow phlegm and congestion.  Pt states fever at home. Headache.

## 2011-12-15 ENCOUNTER — Encounter (HOSPITAL_BASED_OUTPATIENT_CLINIC_OR_DEPARTMENT_OTHER): Payer: Self-pay | Admitting: *Deleted

## 2011-12-15 ENCOUNTER — Emergency Department (HOSPITAL_BASED_OUTPATIENT_CLINIC_OR_DEPARTMENT_OTHER)
Admission: EM | Admit: 2011-12-15 | Discharge: 2011-12-15 | Disposition: A | Discharge status: Home / Self Care | Payer: Medicaid Other | Attending: Emergency Medicine | Admitting: Emergency Medicine

## 2011-12-15 ENCOUNTER — Emergency Department (HOSPITAL_BASED_OUTPATIENT_CLINIC_OR_DEPARTMENT_OTHER): Payer: Medicaid Other

## 2011-12-15 DIAGNOSIS — S335XXA Sprain of ligaments of lumbar spine, initial encounter: Secondary | ICD-10-CM | POA: Insufficient documentation

## 2011-12-15 DIAGNOSIS — Y998 Other external cause status: Secondary | ICD-10-CM | POA: Insufficient documentation

## 2011-12-15 DIAGNOSIS — S8392XA Sprain of unspecified site of left knee, initial encounter: Secondary | ICD-10-CM

## 2011-12-15 DIAGNOSIS — W19XXXA Unspecified fall, initial encounter: Secondary | ICD-10-CM | POA: Insufficient documentation

## 2011-12-15 DIAGNOSIS — IMO0002 Reserved for concepts with insufficient information to code with codable children: Secondary | ICD-10-CM | POA: Insufficient documentation

## 2011-12-15 DIAGNOSIS — Y9389 Activity, other specified: Secondary | ICD-10-CM | POA: Insufficient documentation

## 2011-12-15 DIAGNOSIS — M129 Arthropathy, unspecified: Secondary | ICD-10-CM | POA: Insufficient documentation

## 2011-12-15 DIAGNOSIS — Y9210 Unspecified residential institution as the place of occurrence of the external cause: Secondary | ICD-10-CM | POA: Insufficient documentation

## 2011-12-15 DIAGNOSIS — S39012A Strain of muscle, fascia and tendon of lower back, initial encounter: Secondary | ICD-10-CM

## 2011-12-15 DIAGNOSIS — F17200 Nicotine dependence, unspecified, uncomplicated: Secondary | ICD-10-CM | POA: Insufficient documentation

## 2011-12-15 LAB — PREGNANCY, URINE: Preg Test, Ur: NEGATIVE

## 2011-12-15 MED ORDER — OXYCODONE-ACETAMINOPHEN 5-325 MG PO TABS: 1.0000 | ORAL_TABLET | ORAL | Status: DC | PRN

## 2011-12-15 MED ORDER — OXYCODONE-ACETAMINOPHEN 5-325 MG PO TABS
1.0000 | ORAL_TABLET | Freq: Once | ORAL | Status: AC
  Administered 2011-12-15: 1 via ORAL
  Filled 2011-12-15 (×2): qty 1

## 2011-12-15 NOTE — ED Provider Notes (Signed)
History   This chart was scribed for Dione Booze, MD by Lynelle Smoke. The patient was seen in room MH09/MH09. Patient's care was started at 1731.   CSN: [id number]  Arrival date & time 12/15/11  1659   First MD Initiated Contact with Patient 12/15/11 1731      Chief Complaint  Patient presents with  . Knee Injury    The history is provided by the patient. No language interpreter was used.    Terri White is a 46 y.o. female who presents to the Emergency Department complaining of a sharp, moderate, left-sided, lower back pain that radiates down the posterior left leg to the toes. Patient denies any other symptoms at this time. The pain is intermittent every 5 to 10 minutes. Severity of the pain was 8 out of 10. Patient denies any other symptoms at this time. Patient fell on her left knee while [place]. Modifying factors consist of icing the pain which helps. She took Ibuprofen and Percocet for pain relief with no significant relief. Patient smokes and drinks.   Past Medical History  Diagnosis Date  . Arthritis   . Back pain     Past Surgical History  Procedure Date  . Cesarean section   . Knee surgery   . Knee surgery     R knee    History reviewed. No pertinent family history.  History  Substance Use Topics  . Smoking status: Current Some Day Smoker -- 0.5 packs/day for 10 years    Types: Cigarettes  . Smokeless tobacco: Never Used  . Alcohol Use: Yes     socially    OB History    Grav Para Term Preterm Abortions TAB SAB Ect Mult Living                  Review of Systems  Musculoskeletal: Positive for back pain and arthralgias.  All other systems reviewed and are negative.    Allergies  Diclofenac sodium; Pyridium; Sulfa antibiotics; and Tramadol  Home Medications   Current Outpatient Rx  Name Route Sig Dispense Refill  . GOODY HEADACHE PO Oral Take 1 packet by mouth every 6 (six) hours as needed. HEADACHE    .  AZITHROMYCIN 250 MG PO TABS  Take 2 po the first day then once a day for the next 4 days. 6 tablet 0  . CLONAZEPAM 0.5 MG PO TABS Oral Take 0.5 mg by mouth 3 (three) times daily as needed. For anxiety    . GLUCOSAMINE-CHONDROITIN 500-400 MG PO TABS Oral Take 1 tablet by mouth 3 (three) times daily.    . IBUPROFEN 200 MG PO TABS Oral Take 800 mg by mouth every 6 (six) hours as needed. PAIN    . ADULT MULTIVITAMIN W/MINERALS CH Oral Take 1 tablet by mouth daily.    [name] PROCHLORPERAZINE 25 MG RE SUPP  Unwrap and insert 1 PR BID PRN nausea, vomiting or headache 6 suppository 0    BP 106/28  Pulse 76  Temp 98.2 F (36.8 C) (Oral)  Resp 20  Ht 5\' 2"  (1.575 m)  Wt 130 lb (58.968 kg)  BMI 23.78 kg/m2  SpO2 100%  LMP 11/17/2011  Physical Exam  Nursing note and vitals reviewed. Constitutional: She is oriented to person, place, and time. She appears well-developed and well-nourished.  HENT:  Head: Normocephalic and atraumatic.  Neck: Normal range of motion. Neck supple.  Musculoskeletal: Normal range of motion.  Left knee: tenderness found.       Lumbar back: She exhibits tenderness.       Moderate mid and upper lumbar spine tenderness.  Tenderness to the medial aspect of the left knee, no effusion, No instability, neurovascularly intact. Negative strait leg raise.  Neurological: She is alert and oriented to person, place, and time.  Skin: Skin is warm and dry.  Psychiatric: She has a normal mood and affect. Her behavior is normal.    ED Course  Procedures (including critical care time) DIAGNOSTIC STUDIES: Oxygen Saturation is 100% on room air, normal by my interpretation.    COORDINATION OF CARE: 5:42pm- Patient informed of clinical course, understand medical decision-making process, and agree with plan.  Results for orders placed during the hospital encounter of 12/15/11  PREGNANCY, URINE      Component Value Range   Preg Test, Ur NEGATIVE  NEGATIVE    Dg Lumbar Spine  Complete  12/15/2011  *RADIOLOGY REPORT*  Clinical Data: Post-traumatic low back pain.  LUMBAR SPINE - COMPLETE 4+ VIEW  Comparison: 10/13/2011 radiographs.  Findings: There is chronic degenerative disc disease at L4-L5 with endplate sclerosis and slight irregularity.  There is lesser disc space loss at L2-L3 and L3-L4.  The alignment is stable.  There is no evidence of acute fracture or pars defect.  IMPRESSION: Stable lumbar spondylosis, most advanced at L4-L5.  No acute osseous findings or significant malalignment.   Original Report Authenticated By: Gerrianne Scale, M.D.    Dg Knee Complete 4 Views Left  12/15/2011  *RADIOLOGY REPORT*  Clinical Data: Post-traumatic left knee pain.  LEFT KNEE - COMPLETE 4+ VIEW  Comparison: 06/23/2011 radiographs.  Findings: The mineralization and alignment are normal.  There is no evidence of acute fracture or dislocation.  There is mild medial compartment joint space loss which appears stable.  No significant knee joint effusion is seen.  IMPRESSION: Stable mild medial compartment degenerative changes.  No acute osseous findings.   Original Report Authenticated By: Gerrianne Scale, M.D.      1. Fall   2. Sprain of left knee   3. Lumbar strain       MDM  Knee injury which is most consistent with mild strain of the MCL. No evidence internal derangement. Back injury he appears a musculoskeletal as well. No evidence of any neurologic injury. X-rays will be obtained.  X-ray showed no evidence of fracture. She is placed in a knee immobilizer for comfort and given crutches to use as needed and senna with a prescription for Percocet. She is referred to on-call orthopedics.      I personally performed the services described in this documentation, which was scribed in my presence. The recorded information has been reviewed and considered.      Dione Booze, MD 12/15/11 [phone]

## 2011-12-15 NOTE — ED Notes (Signed)
Pt states she was playing at [facility] last p.m and injured her left knee and back.

## 2011-12-22 ENCOUNTER — Emergency Department (HOSPITAL_COMMUNITY)
Admission: EM | Admit: 2011-12-22 | Discharge: 2011-12-22 | Disposition: A | Discharge status: Home / Self Care | Payer: Medicaid Other | Attending: Emergency Medicine | Admitting: Emergency Medicine

## 2011-12-22 ENCOUNTER — Emergency Department (HOSPITAL_COMMUNITY): Payer: Medicaid Other

## 2011-12-22 ENCOUNTER — Encounter (HOSPITAL_COMMUNITY): Payer: Self-pay | Admitting: Emergency Medicine

## 2011-12-22 DIAGNOSIS — F17200 Nicotine dependence, unspecified, uncomplicated: Secondary | ICD-10-CM | POA: Insufficient documentation

## 2011-12-22 DIAGNOSIS — M25569 Pain in unspecified knee: Secondary | ICD-10-CM | POA: Insufficient documentation

## 2011-12-22 DIAGNOSIS — S8390XA Sprain of unspecified site of unspecified knee, initial encounter: Secondary | ICD-10-CM

## 2011-12-22 DIAGNOSIS — M129 Arthropathy, unspecified: Secondary | ICD-10-CM | POA: Insufficient documentation

## 2011-12-22 DIAGNOSIS — Z882 Allergy status to sulfonamides status: Secondary | ICD-10-CM | POA: Insufficient documentation

## 2011-12-22 DIAGNOSIS — M25559 Pain in unspecified hip: Secondary | ICD-10-CM | POA: Insufficient documentation

## 2011-12-22 MED ORDER — OXYCODONE-ACETAMINOPHEN 5-325 MG PO TABS: 2.0000 | ORAL_TABLET | ORAL | Status: DC | PRN

## 2011-12-22 MED ORDER — METHOCARBAMOL 750 MG PO TABS: 750.0000 mg | ORAL_TABLET | Freq: Four times a day (QID) | ORAL | Status: DC

## 2011-12-22 NOTE — ED Notes (Signed)
Pt states that she has brace and crutches at home. She does not want these. ED MD aware.

## 2011-12-22 NOTE — ED Notes (Signed)
Patient transported to X-ray 

## 2011-12-22 NOTE — ED Notes (Signed)
Pt presenting to ed with c/o left knee pain pt states she injured her knee x 1 week ago zip lining and then she fell on her knee again last night

## 2011-12-22 NOTE — ED Provider Notes (Signed)
History     CSN: [id number]  Arrival date & time 12/22/11  0744   First MD Initiated Contact with Patient 12/22/11 0805      Chief Complaint  Patient presents with  . Knee Pain    (Consider location/radiation/quality/duration/timing/severity/associated sxs/prior treatment) Patient is a 46 y.o. female presenting with knee pain. The history is provided by the patient.  Knee Pain   patient here with left knee pain after walking today. No significant hernia week ago. Pain is sharp at the medial portion of her left knee. Some hip pain which has a lot. Has been using over-the-counter medications without response.  Past Medical History  Diagnosis Date  . Arthritis   . Back pain     Past Surgical History  Procedure Date  . Cesarean section   . Knee surgery   . Knee surgery     R knee    No family history on file.  History  Substance Use Topics  . Smoking status: Current Some Day Smoker -- 0.5 packs/day for 10 years    Types: Cigarettes  . Smokeless tobacco: Never Used  . Alcohol Use: Yes     socially    OB History    Grav Para Term Preterm Abortions TAB SAB Ect Mult Living                  Review of Systems  All other systems reviewed and are negative.    Allergies  Diclofenac sodium; Pyridium; Sulfa antibiotics; and Tramadol  Home Medications   Current Outpatient Rx  Name Route Sig Dispense Refill  . GOODY HEADACHE PO Oral Take 1 packet by mouth every 6 (six) hours as needed. HEADACHE    . AZITHROMYCIN 250 MG PO TABS  Take 2 po the first day then once a day for the next 4 days. 6 tablet 0  . CLONAZEPAM 0.5 MG PO TABS Oral Take 0.5 mg by mouth 3 (three) times daily as needed. For anxiety    . GLUCOSAMINE-CHONDROITIN 500-400 MG PO TABS Oral Take 1 tablet by mouth 3 (three) times daily.    . IBUPROFEN 200 MG PO TABS Oral Take 800 mg by mouth every 6 (six) hours as needed. PAIN    . ADULT MULTIVITAMIN W/MINERALS CH Oral Take 1 tablet by mouth daily.    .  OXYCODONE-ACETAMINOPHEN 5-325 MG PO TABS Oral Take 1 tablet by mouth every 4 (four) hours as needed for pain. 20 tablet 0  . PROCHLORPERAZINE 25 MG RE SUPP  Unwrap and insert 1 PR BID PRN nausea, vomiting or headache 6 suppository 0    BP 112/53  Pulse 86  Temp 98.4 F (36.9 C) (Oral)  Resp 20  SpO2 99%  LMP 12/16/2011  Physical Exam  Nursing note and vitals reviewed. Constitutional: She is oriented to person, place, and time. She appears well-developed and well-nourished.  Non-toxic appearance.  HENT:  Head: Normocephalic and atraumatic.  Eyes: Conjunctivae normal are normal. Pupils are equal, round, and reactive to light.  Neck: Normal range of motion.  Cardiovascular: Normal rate.   Pulmonary/Chest: Effort normal.  Musculoskeletal:       Left knee: She exhibits no effusion and no deformity. tenderness found. MCL tenderness noted.  Neurological: She is alert and oriented to person, place, and time.  Skin: Skin is warm and dry.  Psychiatric: She has a normal mood and affect.    ED Course  Procedures (including critical care time)  Labs Reviewed - No data to  display No results found.   No diagnosis found.    MDM  Patient given crutches a knee brace and orthopedic referral        Toy Baker, MD 12/22/11 [phone]

## 2011-12-31 ENCOUNTER — Encounter (HOSPITAL_COMMUNITY): Payer: Self-pay

## 2011-12-31 ENCOUNTER — Emergency Department (HOSPITAL_COMMUNITY)
Admission: EM | Admit: 2011-12-31 | Discharge: 2011-12-31 | Disposition: A | Discharge status: Home / Self Care | Payer: Medicaid Other | Attending: Emergency Medicine | Admitting: Emergency Medicine

## 2011-12-31 DIAGNOSIS — S99919A Unspecified injury of unspecified ankle, initial encounter: Secondary | ICD-10-CM | POA: Insufficient documentation

## 2011-12-31 DIAGNOSIS — M549 Dorsalgia, unspecified: Secondary | ICD-10-CM | POA: Insufficient documentation

## 2011-12-31 DIAGNOSIS — X58XXXA Exposure to other specified factors, initial encounter: Secondary | ICD-10-CM | POA: Insufficient documentation

## 2011-12-31 DIAGNOSIS — Y929 Unspecified place or not applicable: Secondary | ICD-10-CM | POA: Insufficient documentation

## 2011-12-31 DIAGNOSIS — S8992XA Unspecified injury of left lower leg, initial encounter: Secondary | ICD-10-CM

## 2011-12-31 DIAGNOSIS — Y9339 Activity, other involving climbing, rappelling and jumping off: Secondary | ICD-10-CM | POA: Insufficient documentation

## 2011-12-31 DIAGNOSIS — M1990 Unspecified osteoarthritis, unspecified site: Secondary | ICD-10-CM | POA: Insufficient documentation

## 2011-12-31 DIAGNOSIS — F17200 Nicotine dependence, unspecified, uncomplicated: Secondary | ICD-10-CM | POA: Insufficient documentation

## 2011-12-31 DIAGNOSIS — S8990XA Unspecified injury of unspecified lower leg, initial encounter: Secondary | ICD-10-CM | POA: Insufficient documentation

## 2011-12-31 NOTE — ED Provider Notes (Signed)
History     CSN: [id number]  Arrival date & time 12/31/11  1022   First MD Initiated Contact with Patient 12/31/11 1032      Chief Complaint  Patient presents with  . Knee Pain  . Medication Refill    (Consider location/radiation/quality/duration/timing/severity/associated sxs/prior treatment) HPI  46 year old female presents complaining of left knee pain. Patient reports she injured her left knee from a zip line accident about 10 days ago. States she was evaluated in the ED and was diagnosed with a suspected medial meniscal tear. She is scheduled to be seen by an orthopedic doctor on October 30. States she has ran out of pain medication and the pain has been worsening. Describe pain as a sharp and throbbing sensation worsening with  knee flexion. She has been using crutches, knee sleeves, and rice therapy without adequate relief. States she cannot sleep due to her pain. She also reports that she's unable to take muscle relaxant because it makes her sleepy. She denies fever or rash.  Past Medical History  Diagnosis Date  . Arthritis   . Back pain     Past Surgical History  Procedure Date  . Cesarean section   . Knee surgery   . Knee surgery     R knee    No family history on file.  History  Substance Use Topics  . Smoking status: Current Some Day Smoker -- 0.5 packs/day for 10 years    Types: Cigarettes  . Smokeless tobacco: Never Used  . Alcohol Use: Yes     socially    OB History    Grav Para Term Preterm Abortions TAB SAB Ect Mult Living                  Review of Systems  Constitutional: Negative for fever.  Musculoskeletal: Positive for arthralgias and gait problem. Negative for back pain.  Skin: Negative for rash and wound.  Neurological: Negative for numbness.    Allergies  Diclofenac sodium; Pyridium; Sulfa antibiotics; and Tramadol  Home Medications   Current Outpatient Rx  Name Route Sig Dispense Refill  . CLONAZEPAM 0.5 MG PO TABS Oral Take  0.5 mg by mouth 3 (three) times daily as needed. For anxiety    . GLUCOSAMINE-CHONDROITIN 500-400 MG PO TABS Oral Take 1 tablet by mouth 3 (three) times daily.    . IBUPROFEN 200 MG PO TABS Oral Take 800 mg by mouth every 6 (six) hours as needed. PAIN    . METHOCARBAMOL 750 MG PO TABS Oral Take 1 tablet (750 mg total) by mouth 4 (four) times daily. 20 tablet 0  . ADULT MULTIVITAMIN W/MINERALS CH Oral Take 1 tablet by mouth daily.    . OXYCODONE-ACETAMINOPHEN 5-325 MG PO TABS Oral Take 1 tablet by mouth every 4 (four) hours as needed for pain. 20 tablet 0  . OXYCODONE-ACETAMINOPHEN 5-325 MG PO TABS Oral Take 2 tablets by mouth every 4 (four) hours as needed for pain. 10 tablet 0  . PROCHLORPERAZINE 25 MG RE SUPP Rectal Place 25 mg rectally 2 (two) times daily as needed. Nausea, vomiting, headache      BP 124/74  Pulse 100  Temp 98.8 F (37.1 C) (Oral)  Resp 18  Ht 5\' 2"  (1.575 m)  Wt 130 lb (58.968 kg)  BMI 23.78 kg/m2  SpO2 100%  LMP 12/22/2011  Physical Exam  Constitutional: She appears well-developed and well-nourished. No distress.  HENT:  Head: Atraumatic.  Eyes: Conjunctivae normal are normal.  Neck:  Neck supple.  Musculoskeletal: She exhibits tenderness (Left knee with point tenderness to medial aspects of patella, no overlying skin changes. Increasing pain with knee flexion and extension. Negative anterior and posterior drawer test. No swelling noted).  Neurological: She is alert.  Skin: Skin is warm. No rash noted.  Psychiatric: She has a normal mood and affect.    ED Course  Procedures (including critical care time)  Labs Reviewed - No data to display No results found.   No diagnosis found.  1. L knee pain.   MDM  L knee pain, mult. Visits for same, requesting narcotic pain meds.    I have reviewed records of multiple ED visits with similar or other pain related complaints, usually with negative workups.  I feel that the patient's pain is chronic and cannot  appropriately or safely treated in an emergency department setting.   I do not feel that providing narcotic pain medication is in this patient's best interest.  I have urged the patient to have close follow up with their provider or pain specialist.  I have explicitly discussed with the patient return precautions and have reassured patient that they can always be seen and evaluated in the emergency department for any condition that they feel is emergent, and that they will be given treatment as the EDP feels is appropriate and safe, but this may not involve the use of narcotic pain medications.   The patient was given the opportunity to voice any further questions or concerns and these were addressed to the best of my ability.  BP 124/74  Pulse 100  Temp 98.8 F (37.1 C) (Oral)  Resp 18  Ht 5\' 2"  (1.575 m)  Wt 130 lb (58.968 kg)  BMI 23.78 kg/m2  SpO2 100%  LMP 12/22/2011  Nursing notes reviewed and considered in documentation  Previous records reviewed and considered  All vitals reviewed and considered  Previouis xrays reviewed and considered        Fayrene Helper, PA-C 12/31/11 1110

## 2011-12-31 NOTE — ED Notes (Signed)
Pt presents with NAD- left knee injury. Seen and treated here for presenting complaint.  Pt present with immobilizer to left LE.  Pt states she is out of pain medication percocet.  She reports she is unable to take robaxin it makes her sleepy.  No new injury or pain,  Appt with ortho Oct 30

## 2011-12-31 NOTE — Discharge Instructions (Signed)
Please follow up with Dr. Shelle Iron on Oct 30th for your schedule appointment.  Continue taking ibuprofen for pain and follow instruction below.    RICE: Routine Care for Injuries The routine care of many injuries includes Rest, Ice, Compression, and Elevation (RICE). HOME CARE INSTRUCTIONS  Rest is needed to allow your body to heal. Routine activities can usually be resumed when comfortable. Injured tendons and bones can take up to 6 weeks to heal. Tendons are the cord-like structures that attach muscle to bone.  Ice following an injury helps keep the swelling down and reduces pain.  Put ice in a plastic bag.  Place a towel between your skin and the bag.  Leave the ice on for 15 to 20 minutes, 3 to 4 times a day. Do this while awake, for the first 24 to 48 hours. After that, continue as directed by your caregiver.  Compression helps keep swelling down. It also gives support and helps with discomfort. If an elastic bandage has been applied, it should be removed and reapplied every 3 to 4 hours. It should not be applied tightly, but firmly enough to keep swelling down. Watch fingers or toes for swelling, bluish discoloration, coldness, numbness, or excessive pain. If any of these problems occur, remove the bandage and reapply loosely. Contact your caregiver if these problems continue.  Elevation helps reduce swelling and decreases pain. With extremities, such as the arms, hands, legs, and feet, the injured area should be placed near or above the level of the heart, if possible. SEEK IMMEDIATE MEDICAL CARE IF:  You have persistent pain and swelling.  You develop redness, numbness, or unexpected weakness.  Your symptoms are getting worse rather than improving after several days. These symptoms may indicate that further evaluation or further X-rays are needed. Sometimes, X-rays may not show a small broken bone (fracture) until 1 week or 10 days later. Make a follow-up appointment with your  caregiver. Ask when your X-ray results will be ready. Make sure you get your X-ray results. Document Released: 06/09/2000 Document Revised: 05/20/2011 Document Reviewed: 07/27/2010 [facility] Patient Information 2013 [place], [place].

## 2012-01-01 NOTE — ED Provider Notes (Signed)
Medical screening examination/treatment/procedure(s) were performed by non-physician practitioner and as supervising physician I was immediately available for consultation/collaboration.   Auguste Tebbetts M Kasey Ewings, DO 01/01/12 2026 

## 2012-01-12 ENCOUNTER — Emergency Department (HOSPITAL_COMMUNITY)
Admission: EM | Admit: 2012-01-12 | Discharge: 2012-01-12 | Discharge status: Against Medical Advice | Payer: Medicaid Other | Attending: Emergency Medicine | Admitting: Emergency Medicine

## 2012-01-12 ENCOUNTER — Encounter (HOSPITAL_COMMUNITY): Payer: Self-pay | Admitting: *Deleted

## 2012-01-12 DIAGNOSIS — M542 Cervicalgia: Secondary | ICD-10-CM | POA: Insufficient documentation

## 2012-01-12 DIAGNOSIS — R51 Headache: Secondary | ICD-10-CM | POA: Insufficient documentation

## 2012-01-12 NOTE — ED Notes (Signed)
Patient is alert and oriented x3.  She is complaining of head and neck pain that started 2 days ago. She rates her pain currently at 8 of 10.  She confirms nausea but denies vomiting.

## 2012-01-12 NOTE — ED Notes (Signed)
Patient left before she could be seen.  She notified staff before leaving she took her own medication before she left.

## 2012-01-19 ENCOUNTER — Emergency Department (HOSPITAL_COMMUNITY)
Admission: EM | Admit: 2012-01-19 | Discharge: 2012-01-20 | Disposition: A | Discharge status: Home / Self Care | Payer: Medicaid Other | Attending: Emergency Medicine | Admitting: Emergency Medicine

## 2012-01-19 DIAGNOSIS — Z79899 Other long term (current) drug therapy: Secondary | ICD-10-CM | POA: Insufficient documentation

## 2012-01-19 DIAGNOSIS — Y929 Unspecified place or not applicable: Secondary | ICD-10-CM | POA: Insufficient documentation

## 2012-01-19 DIAGNOSIS — W19XXXA Unspecified fall, initial encounter: Secondary | ICD-10-CM

## 2012-01-19 DIAGNOSIS — W11XXXA Fall on and from ladder, initial encounter: Secondary | ICD-10-CM | POA: Insufficient documentation

## 2012-01-19 DIAGNOSIS — S161XXA Strain of muscle, fascia and tendon at neck level, initial encounter: Secondary | ICD-10-CM

## 2012-01-19 DIAGNOSIS — M129 Arthropathy, unspecified: Secondary | ICD-10-CM | POA: Insufficient documentation

## 2012-01-19 DIAGNOSIS — S139XXA Sprain of joints and ligaments of unspecified parts of neck, initial encounter: Secondary | ICD-10-CM | POA: Insufficient documentation

## 2012-01-19 DIAGNOSIS — F17200 Nicotine dependence, unspecified, uncomplicated: Secondary | ICD-10-CM | POA: Insufficient documentation

## 2012-01-19 DIAGNOSIS — Y9389 Activity, other specified: Secondary | ICD-10-CM | POA: Insufficient documentation

## 2012-01-19 DIAGNOSIS — S20219A Contusion of unspecified front wall of thorax, initial encounter: Secondary | ICD-10-CM | POA: Insufficient documentation

## 2012-01-20 ENCOUNTER — Emergency Department (HOSPITAL_COMMUNITY): Payer: Medicaid Other

## 2012-01-20 ENCOUNTER — Encounter (HOSPITAL_COMMUNITY): Payer: Self-pay | Admitting: Emergency Medicine

## 2012-01-20 MED ORDER — OXYCODONE-ACETAMINOPHEN 5-325 MG PO TABS
2.0000 | ORAL_TABLET | Freq: Once | ORAL | Status: AC
  Administered 2012-01-20: 2 via ORAL
  Filled 2012-01-20: qty 2

## 2012-01-20 MED ORDER — OXYCODONE-ACETAMINOPHEN 5-325 MG PO TABS: 2.0000 | ORAL_TABLET | Freq: Four times a day (QID) | ORAL | Status: DC | PRN

## 2012-01-20 NOTE — ED Notes (Signed)
Pt sts she fell off  Top a 4 ft ladder falling back and to right side. Pt c/o head, right rib and neck pain,pt has nausea and dizziness. Collar placed on pt.VSS.

## 2012-01-20 NOTE — ED Notes (Signed)
Patient transported to CT 

## 2012-01-20 NOTE — ED Provider Notes (Signed)
History     CSN: [id number]  Arrival date & time 01/19/12  2332   First MD Initiated Contact with Patient 01/20/12 0125      Chief Complaint  Patient presents with  . Neck Pain  . Fall    (Consider location/radiation/quality/duration/timing/severity/associated sxs/prior treatment) HPI Comments: Patient states she fell off a ladder while painting tonight landing on her R side now having R rib pain and L neck pain Also has L knee pain but has a 41 week old  Hx of L knee injury from a zip line accident   Patient is a 46 y.o. female presenting with neck pain and fall. The history is provided by the patient.  Neck Pain  This is a new problem. The current episode started 1 to 2 hours ago. The problem occurs constantly. The problem has not changed since onset.The pain is associated with a fall. There has been no fever. Associated symptoms include chest pain. Pertinent negatives include no weakness.  Fall Pertinent negatives include no fever and no nausea.    Past Medical History  Diagnosis Date  . Arthritis   . Back pain     Past Surgical History  Procedure Date  . Cesarean section   . Knee surgery   . Knee surgery     R knee    No family history on file.  History  Substance Use Topics  . Smoking status: Current Some Day Smoker -- 0.5 packs/day for 10 years    Types: Cigarettes  . Smokeless tobacco: Never Used  . Alcohol Use: Yes     Comment: socially    OB History    Grav Para Term Preterm Abortions TAB SAB Ect Mult Living                  Review of Systems  Constitutional: Negative for fever and chills.  HENT: Positive for neck pain.   Respiratory: Negative for cough and shortness of breath.   Cardiovascular: Positive for chest pain.  Gastrointestinal: Negative for nausea.  Musculoskeletal: Positive for joint swelling.  Skin: Negative for wound.  Neurological: Negative for dizziness and weakness.    Allergies  Diclofenac sodium; Pyridium; Sulfa  antibiotics; and Tramadol  Home Medications   Current Outpatient Rx  Name  Route  Sig  Dispense  Refill  . CLONAZEPAM 0.5 MG PO TABS   Oral   Take 0.5 mg by mouth 3 (three) times daily as needed. For anxiety         . GLUCOSAMINE-CHONDROITIN 500-400 MG PO TABS   Oral   Take 1 tablet by mouth 3 (three) times daily.         . ADULT MULTIVITAMIN W/MINERALS CH   Oral   Take 1 tablet by mouth daily.         . IBUPROFEN 200 MG PO TABS   Oral   Take 800 mg by mouth every 6 (six) hours as needed. PAIN           BP 115/61  Pulse 92  Temp 98.6 F (37 C) (Oral)  Resp 20  SpO2 96%  LMP 12/22/2011  Physical Exam  Constitutional: She is oriented to person, place, and time. She appears well-developed and well-nourished.  HENT:  Head: Normocephalic.  Eyes: Pupils are equal, round, and reactive to light.  Neck: Muscular tenderness present. No spinous process tenderness present.    Cardiovascular: Normal rate.   Pulmonary/Chest: Effort normal and breath sounds normal. She exhibits tenderness.  Abdominal:  Soft. She exhibits no distension.  Musculoskeletal: Normal range of motion.  Neurological: She is alert and oriented to person, place, and time.  Skin: Skin is warm. No erythema.    ED Course  Procedures (including critical care time)  Labs Reviewed - No data to display Dg Chest 2 View  01/20/2012  *RADIOLOGY REPORT*  Clinical Data: Neck pain.  Fall  CHEST - 2 VIEW  Comparison: 12/13/2011  Findings: The heart size and mediastinal contours are within normal limits.  Both lungs are clear.  The visualized skeletal structures are unremarkable.  IMPRESSION: No acute cardiopulmonary abnormalities.   Original Report Authenticated By: Signa Kell, M.D.      No diagnosis found.    MDM   Chest xray normal         Arman Filter, NP 01/20/12 [phone]

## 2012-01-20 NOTE — ED Provider Notes (Signed)
Medical screening examination/treatment/procedure(s) were performed by non-physician practitioner and as supervising physician I was immediately available for consultation/collaboration.   Filippo Puls, MD 01/20/12 0646 

## 2012-01-20 NOTE — ED Notes (Signed)
Attempting to discharge patient. And patient was not in room . Located patient outside of ED adjacent to parking smoking cigarette, made charge nurse aware

## 2012-02-07 ENCOUNTER — Encounter (HOSPITAL_COMMUNITY): Payer: Self-pay | Admitting: Emergency Medicine

## 2012-02-07 ENCOUNTER — Emergency Department (HOSPITAL_COMMUNITY)
Admission: EM | Admit: 2012-02-07 | Discharge: 2012-02-07 | Disposition: A | Discharge status: Home / Self Care | Payer: Medicaid Other | Attending: Emergency Medicine | Admitting: Emergency Medicine

## 2012-02-07 DIAGNOSIS — F17200 Nicotine dependence, unspecified, uncomplicated: Secondary | ICD-10-CM | POA: Insufficient documentation

## 2012-02-07 DIAGNOSIS — K047 Periapical abscess without sinus: Secondary | ICD-10-CM | POA: Insufficient documentation

## 2012-02-07 DIAGNOSIS — Y9389 Activity, other specified: Secondary | ICD-10-CM | POA: Insufficient documentation

## 2012-02-07 DIAGNOSIS — X58XXXA Exposure to other specified factors, initial encounter: Secondary | ICD-10-CM | POA: Insufficient documentation

## 2012-02-07 DIAGNOSIS — Z8739 Personal history of other diseases of the musculoskeletal system and connective tissue: Secondary | ICD-10-CM | POA: Insufficient documentation

## 2012-02-07 DIAGNOSIS — S025XXA Fracture of tooth (traumatic), initial encounter for closed fracture: Secondary | ICD-10-CM | POA: Insufficient documentation

## 2012-02-07 DIAGNOSIS — Y929 Unspecified place or not applicable: Secondary | ICD-10-CM | POA: Insufficient documentation

## 2012-02-07 DIAGNOSIS — K0889 Other specified disorders of teeth and supporting structures: Secondary | ICD-10-CM

## 2012-02-07 MED ORDER — PENICILLIN V POTASSIUM 500 MG PO TABS: 500.0000 mg | ORAL_TABLET | Freq: Four times a day (QID) | ORAL | Status: AC

## 2012-02-07 MED ORDER — ONDANSETRON 4 MG PO TBDP
4.0000 mg | ORAL_TABLET | Freq: Once | ORAL | Status: AC
  Administered 2012-02-07: 4 mg via ORAL
  Filled 2012-02-07: qty 1

## 2012-02-07 MED ORDER — OXYCODONE-ACETAMINOPHEN 5-325 MG PO TABS
2.0000 | ORAL_TABLET | Freq: Once | ORAL | Status: AC
  Administered 2012-02-07: 2 via ORAL
  Filled 2012-02-07: qty 2

## 2012-02-07 MED ORDER — OXYCODONE-ACETAMINOPHEN 10-325 MG PO TABS: 1.0000 | ORAL_TABLET | ORAL | Status: DC | PRN

## 2012-02-07 NOTE — ED Provider Notes (Signed)
History     CSN: [id number]  Arrival date & time 02/07/12  1749   First MD Initiated Contact with Patient 02/07/12 1919      Chief Complaint  Patient presents with  . Dental Pain    (Consider location/radiation/quality/duration/timing/severity/associated sxs/prior treatment) Patient is a 46 y.o. female presenting with tooth pain. The history is provided by the patient and medical records.  Dental PainThe primary symptoms include headaches. Primary symptoms do not include fever or cough.  The headache is not associated with eye pain, neck stiffness or weakness.  Additional symptoms include: facial swelling and ear pain. Additional symptoms do not include: trouble swallowing, drooling and nosebleeds.   Terri White is a 46 y.o. female presents to the emergency department c/o dental pain.  Pt states she broke her tooth 3 days ago while eating nuts.  She states the pain began acutely, has been persistent and gradually worsened.  Pt has associated headache, otalgia, swelling and drainage of puss from around the tooth.  Nothing makes the pain better and everything makes the pain worse.  Pt denies os, neck pain, chest pain, shortness of breath, abdominal pain, nausea, vomiting, diarrhea, weakness, dizziness, syncope.  She denies Hx of IVDU, diabetes steroid use or immunosuppression.  She denies Hx of dental abscess in the past.  Pain is a 10 out of 10 and unbearable.  Past Medical History  Diagnosis Date  . Arthritis   . Back pain     Past Surgical History  Procedure Date  . Cesarean section   . Knee surgery   . Knee surgery     R knee    No family history on file.  History  Substance Use Topics  . Smoking status: Current Some Day Smoker -- 0.5 packs/day for 10 years    Types: Cigarettes  . Smokeless tobacco: Never Used  . Alcohol Use: No     Comment: socially    OB History    Grav Para Term Preterm Abortions TAB SAB Ect Mult Living                  Review of Systems    Constitutional: Negative for fever, chills and appetite change.  HENT: Positive for ear pain, facial swelling and dental problem. Negative for nosebleeds, rhinorrhea, drooling, trouble swallowing, neck pain, neck stiffness and postnasal drip.   Eyes: Negative for pain and redness.  Respiratory: Negative for cough and wheezing.   Cardiovascular: Negative for chest pain.  Gastrointestinal: Positive for nausea. Negative for vomiting and abdominal pain.  Skin: Negative for color change and rash.  Neurological: Positive for headaches. Negative for weakness and light-headedness.  All other systems reviewed and are negative.    Allergies  Diclofenac sodium; Pyridium; Sulfa antibiotics; and Tramadol  Home Medications   Current Outpatient Rx  Name  Route  Sig  Dispense  Refill  . CLONAZEPAM 0.5 MG PO TABS   Oral   Take 0.5 mg by mouth 3 (three) times daily as needed. For anxiety         . GLUCOSAMINE-CHONDROITIN 500-400 MG PO TABS   Oral   Take 1 tablet by mouth 3 (three) times daily.         . IBUPROFEN 200 MG PO TABS   Oral   Take 800 mg by mouth every 6 (six) hours as needed. PAIN         . ADULT MULTIVITAMIN W/MINERALS CH   Oral   Take 1 tablet by mouth daily.         [name]  OXYCODONE-ACETAMINOPHEN 5-325 MG PO TABS   Oral   Take 2 tablets by mouth every 6 (six) hours as needed. Pain         . OXYCODONE-ACETAMINOPHEN 10-325 MG PO TABS   Oral   Take 1 tablet by mouth every 4 (four) hours as needed for pain.   12 tablet   0   . PENICILLIN V POTASSIUM 500 MG PO TABS   Oral   Take 1 tablet (500 mg total) by mouth 4 (four) times daily.   40 tablet   0     BP 107/71  Pulse 103  Temp 98.7 F (37.1 C) (Oral)  Resp 16  Wt 130 lb (58.968 kg)  SpO2 98%  LMP 01/15/2012  Physical Exam  Nursing note and vitals reviewed. Constitutional: She appears well-developed and well-nourished.  HENT:  Head: Normocephalic.  Right Ear: Tympanic membrane, external ear and ear  canal normal.  Left Ear: Tympanic membrane, external ear and ear canal normal.  Nose: Nose normal. Right sinus exhibits no maxillary sinus tenderness and no frontal sinus tenderness. Left sinus exhibits no maxillary sinus tenderness and no frontal sinus tenderness.  Mouth/Throat: Uvula is midline, oropharynx is clear and moist and mucous membranes are normal. No oral lesions. Abnormal dentition. Dental abscesses present. No uvula swelling, lacerations or dental caries. No oropharyngeal exudate, posterior oropharyngeal edema, posterior oropharyngeal erythema or tonsillar abscesses.    Eyes: Conjunctivae normal and EOM are normal. Pupils are equal, round, and reactive to light. Right eye exhibits no discharge. Left eye exhibits no discharge.  Neck: Normal range of motion. Neck supple.  Cardiovascular: Normal rate, regular rhythm and normal heart sounds.   Pulmonary/Chest: Effort normal and breath sounds normal. No respiratory distress. She has no wheezes.  Abdominal: Soft. Bowel sounds are normal. She exhibits no distension. There is no tenderness.  Lymphadenopathy:    She has no cervical adenopathy.  Neurological: She is alert.  Skin: Skin is warm and dry.  Psychiatric: She has a normal mood and affect.    ED Course  Dental Date/Time: 02/07/2012 9:17 PM Performed by: Dierdre Forth Authorized by: Dierdre Forth Consent: Verbal consent obtained. Risks and benefits: risks, benefits and alternatives were discussed Consent given by: patient Patient understanding: patient states understanding of the procedure being performed Patient consent: the patient's understanding of the procedure matches consent given Procedure consent: procedure consent matches procedure scheduled Relevant documents: relevant documents present and verified Test results: test results available and properly labeled Imaging studies: imaging studies not available Required items: required blood products,  implants, devices, and special equipment available Patient identity confirmed: verbally with patient Time out: Immediately prior to procedure a "time out" was called to verify the correct patient, procedure, equipment, support staff and site/side marked as required. Preparation: Patient was prepped and draped in the usual sterile fashion. Local anesthesia used: yes Anesthesia: local infiltration Local anesthetic: bupivacaine 0.5% with epinephrine Patient sedated: no Comments: Dental block given Abscess opened with scalpel    (including critical care time)  INCISION AND DRAINAGE Performed by: Dierdre Forth Consent: Verbal consent obtained. Risks and benefits: risks, benefits and alternatives were discussed Type: abscess  Body area: L upper gum line  Anesthesia: dental block  Incision was made with a scalpel.  Local anesthetic: bupivacaine 0.5% with epinephrine  Anesthetic total: 1 ml  Complexity: complex Blunt dissection to break up loculations  Drainage: purulent  Drainage amount: 0.5cc  Patient tolerance: Patient tolerated the procedure well with no immediate complications.  Labs Reviewed - No data to display No results found.   1. Pain, dental   2. Broken tooth   3. Dental abscess       MDM  Terri White presents with dental abscess.  Pt is not a diabetic or at risk for HIV. Patient with toothache.  Gross abscess identified.  Dental block administered and abscess opened with scalpel without complication.  Exam unconcerning for Ludwig's angina or spread of infection.  Will treat with penicillin and pain medicine.  Urged patient to follow-up with dentist.      1. Medications: Percocet, penicillin, usual home medications 2. Treatment: rest, drink plenty of fluids, warm saltwater swishes 3. Follow Up: Please followup with your primary doctor for discussion of your diagnoses and further evaluation after today's visit; if you do not have a primary care  doctor use the resource guide provided to find one; follow up with your dentist  Dierdre Forth, PA-C 02/08/12 0141

## 2012-02-07 NOTE — Discharge Instructions (Signed)
1. Medications: Percocet, penicillin, usual home medications 2. Treatment: rest, drink plenty of fluids, warm saltwater swishes 3. Follow Up: Please followup with your primary doctor for discussion of your diagnoses and further evaluation after today's visit; if you do not have a primary care doctor use the resource guide provided to find one; follow up with your dentist   You have a dental injury. Use the resource guide listed below to help you find a dentist if you do not already have one to followup with. It is very important that you get evaluated by a dentist as soon as possible. Call tomorrow to schedule an appointment. Use your pain medication as prescribed and do not operate heavy machinery while on pain medication. Note that your pain medication contains acetaminophen (Tylenol) & its is not reccommended that you use additional acetaminophen (Tylenol) while taking this medication. Take your full course of antibiotics. Read the instructions below.  Eat a soft or liquid diet and rinse your mouth out after meals with warm water. You should see a dentist or return here at once if you have increased swelling, increased pain or uncontrolled bleeding from the site of your injury.   SEEK MEDICAL CARE IF:   You have increased pain not controlled with medicines.   You have swelling around your tooth, in your face or neck.   You have bleeding which starts, continues, or gets worse.   You have a fever >101  If you are unable to open your mouth  RESOURCE GUIDE  Dental Problems  Patients with Medicaid: [facility] [phone] [address]                                           [phone] [address] Phone:  [phone]                                                  Phone:  [phone]  If unable to pay or uninsured, contact:  [facility] or [facility]. to become qualified for the adult dental clinic.  Chronic Pain  Problems Contact [facility]  [phone] Patients need to be referred by their primary care doctor.  Insufficient Money for Medicine Contact [employer]:  call "211" or [facility] 671-469-5395.  No Primary Care Doctor Call [facility]  [phone] Other agencies that provide inexpensive medical care    [facility]  [phone]    [facility]  [phone]    [facility]  [phone]    [facility]  [phone]    [facility]  [phone]    [facility]  [phone]  Psychological Services [facility]  [phone] [facility]  [phone] [facility]   [phone] (emergency services ([phone])  Substance Abuse Resources [facility]  ([phone] [facility] 6150328765 [place] [phone] Floydene Flock [phone] Residential & Outpatient Substance Abuse Program  7794340013  Abuse/Neglect [facility] Child Abuse Hotline (314)699-3393 [facility] Child Abuse Hotline 7047415941 (After Hours)  Emergency Shelter [facility] Ministries (  [phone]  Maternity Homes [facility] [place] [facility] [place] [phone] [facility] 7401955663  MRSA Hotline #:   365-477-9334    [facility] Resources  [facility]     [employer]                          [facility] Dept. [address] [place]                       [address]      [address]  Blondell Reveal Phone:  [phone]                                   Phone:  [phone]                 Phone:  [phone]  [facility] Mental Health Phone:  [phone]  [facility] Child Abuse Hotline (737) [phone] (After Hours)

## 2012-02-07 NOTE — ED Notes (Signed)
Pt was eating nuts on Wed night and broke tooth on upper left side.  Pt reports she is having pain 8/10.  Patient does not have a regular dentist.

## 2012-02-09 NOTE — ED Provider Notes (Signed)
Medical screening examination/treatment/procedure(s) were performed by non-physician practitioner and as supervising physician I was immediately available for consultation/collaboration.  Montrey Buist T Kalimah Capurro, MD 02/09/12 2221 

## 2012-02-11 ENCOUNTER — Emergency Department (HOSPITAL_COMMUNITY)
Admission: EM | Admit: 2012-02-11 | Discharge: 2012-02-11 | Disposition: A | Discharge status: Home / Self Care | Payer: Medicaid Other | Attending: Emergency Medicine | Admitting: Emergency Medicine

## 2012-02-11 DIAGNOSIS — R221 Localized swelling, mass and lump, neck: Secondary | ICD-10-CM | POA: Insufficient documentation

## 2012-02-11 DIAGNOSIS — K047 Periapical abscess without sinus: Secondary | ICD-10-CM | POA: Insufficient documentation

## 2012-02-11 DIAGNOSIS — M542 Cervicalgia: Secondary | ICD-10-CM | POA: Insufficient documentation

## 2012-02-11 DIAGNOSIS — F17200 Nicotine dependence, unspecified, uncomplicated: Secondary | ICD-10-CM | POA: Insufficient documentation

## 2012-02-11 DIAGNOSIS — R220 Localized swelling, mass and lump, head: Secondary | ICD-10-CM | POA: Insufficient documentation

## 2012-02-11 DIAGNOSIS — Z79899 Other long term (current) drug therapy: Secondary | ICD-10-CM | POA: Insufficient documentation

## 2012-02-11 DIAGNOSIS — Z8739 Personal history of other diseases of the musculoskeletal system and connective tissue: Secondary | ICD-10-CM | POA: Insufficient documentation

## 2012-02-11 MED ORDER — OXYCODONE-ACETAMINOPHEN 5-325 MG PO TABS: 1.0000 | ORAL_TABLET | ORAL | Status: DC | PRN

## 2012-02-11 MED ORDER — IBUPROFEN 800 MG PO TABS: 800.0000 mg | ORAL_TABLET | Freq: Three times a day (TID) | ORAL | Status: DC | PRN

## 2012-02-11 NOTE — ED Notes (Signed)
Pt seen here on the 02/07/2012 with upper left dental pain, returns because dentist would not see her due to her continued swelling of the upper left gum.  Patient is still taking antibiotic but has run out of pain medication.  Pt reports head as well as left side of neck is hurting.  Patient rates pain as an 8.  Last dose of pain medication of yesterday morning.  Took Motrin 600mg  at 1315 today.

## 2012-02-11 NOTE — ED Provider Notes (Signed)
History     CSN: [id number]  Arrival date & time 02/11/12  4098   First MD Initiated Contact with Patient 02/11/12 2104      Chief Complaint  Patient presents with  . Dental Pain    (Consider location/radiation/quality/duration/timing/severity/associated sxs/prior treatment) HPI Comments: Pt seen on 11/30 for dental abscess returns today for refill of pain medication.  Pt was seen in ED, had I&D performed on abscess, was d/c home with percocet and penicillin.  States she has seen dentist since and he will not do anything for her while she still has swelling.  States she continues to have gingival swelling and purulent drainage.  Pain radiates into her left posterior neck.  States overall the infection, swelling, and pain seem to be getting better.  Denies fevers, chills, sore throat, difficulty swallowing or breathing.  Is only on day 4 of 10 days of penicillin, wants better pain control until she can see the dentist.    Patient is a 46 y.o. female presenting with tooth pain. The history is provided by the patient and medical records.  Dental PainPrimary symptoms do not include fever, shortness of breath or sore throat.  Additional symptoms do not include: trouble swallowing, drooling and ear pain.    Past Medical History  Diagnosis Date  . Arthritis   . Back pain     Past Surgical History  Procedure Date  . Cesarean section   . Knee surgery   . Knee surgery     R knee    No family history on file.  History  Substance Use Topics  . Smoking status: Current Some Day Smoker -- 0.5 packs/day for 10 years    Types: Cigarettes  . Smokeless tobacco: Never Used  . Alcohol Use: No     Comment: socially    OB History    Grav Para Term Preterm Abortions TAB SAB Ect Mult Living                  Review of Systems  Constitutional: Negative for fever and chills.  HENT: Positive for neck pain and dental problem. Negative for ear pain, sore throat, drooling, mouth sores, trouble  swallowing, neck stiffness and voice change.   Respiratory: Negative for shortness of breath.     Allergies  Diclofenac sodium; Pyridium; Sulfa antibiotics; and Tramadol  Home Medications   Current Outpatient Rx  Name  Route  Sig  Dispense  Refill  . CLONAZEPAM 0.5 MG PO TABS   Oral   Take 0.5 mg by mouth 3 (three) times daily as needed. For anxiety         . GLUCOSAMINE-CHONDROITIN 500-400 MG PO TABS   Oral   Take 1 tablet by mouth 3 (three) times daily.         . IBUPROFEN 200 MG PO TABS   Oral   Take 800 mg by mouth every 6 (six) hours as needed. PAIN         . ADULT MULTIVITAMIN W/MINERALS CH   Oral   Take 1 tablet by mouth daily.         . OXYCODONE-ACETAMINOPHEN 5-325 MG PO TABS   Oral   Take 2 tablets by mouth every 6 (six) hours as needed. Pain         . PENICILLIN V POTASSIUM 500 MG PO TABS   Oral   Take 1 tablet (500 mg total) by mouth 4 (four) times daily.   40 tablet   0   .  IBUPROFEN 800 MG PO TABS   Oral   Take 1 tablet (800 mg total) by mouth every 8 (eight) hours as needed for pain.   21 tablet   0   . OXYCODONE-ACETAMINOPHEN 5-325 MG PO TABS   Oral   Take 1 tablet by mouth every 4 (four) hours as needed for pain.   15 tablet   0     BP 119/60  Pulse 92  Temp 98.3 F (36.8 C) (Oral)  Resp 16  Wt 130 lb (58.968 kg)  SpO2 93%  LMP 01/15/2012  Physical Exam  Nursing note and vitals reviewed. Constitutional: She appears well-developed and well-nourished. No distress.  HENT:  Head: Normocephalic and atraumatic.  Mouth/Throat: Uvula is midline and oropharynx is clear and moist. Mucous membranes are not dry. No uvula swelling. No oropharyngeal exudate, posterior oropharyngeal edema, posterior oropharyngeal erythema or tonsillar abscesses.    Neck: Trachea normal, normal range of motion and phonation normal. Neck supple. No tracheal tenderness present. No tracheal deviation present.  Pulmonary/Chest: Effort normal. No stridor.    Lymphadenopathy:    She has no cervical adenopathy.  Neurological: She is alert.  Skin: She is not diaphoretic.    ED Course  Procedures (including critical care time)  Labs Reviewed - No data to display No results found.   1. Dental abscess     MDM  Pt currently being treated for known dental abscess since 11/30, requesting pain medication refill.  I have reviewed prior note.  Pt apparently had significant abscess, is attempting to follow up with dentist.  Pt is afebrile, nontoxic, no clinical concern for worsening or deeper infection such as ludwig's angina.  Pt d/c home with dental follow up, continue penicillin, refill percocet #15.  Pt given return precautions.  Pt verbalizes understanding and agrees with plan.           [place], [place] 02/11/12 2150

## 2012-02-15 NOTE — ED Provider Notes (Signed)
Medical screening examination/treatment/procedure(s) were performed by non-physician practitioner and as supervising physician I was immediately available for consultation/collaboration.   Muscab Brenneman, MD 02/15/12 1950 

## 2012-02-28 ENCOUNTER — Encounter (HOSPITAL_COMMUNITY): Payer: Self-pay | Admitting: Emergency Medicine

## 2012-02-28 ENCOUNTER — Emergency Department (HOSPITAL_COMMUNITY)
Admission: EM | Admit: 2012-02-28 | Discharge: 2012-02-28 | Disposition: A | Discharge status: Home / Self Care | Payer: Medicaid Other | Attending: Emergency Medicine | Admitting: Emergency Medicine

## 2012-02-28 DIAGNOSIS — H9209 Otalgia, unspecified ear: Secondary | ICD-10-CM | POA: Insufficient documentation

## 2012-02-28 DIAGNOSIS — J3489 Other specified disorders of nose and nasal sinuses: Secondary | ICD-10-CM | POA: Insufficient documentation

## 2012-02-28 DIAGNOSIS — F17200 Nicotine dependence, unspecified, uncomplicated: Secondary | ICD-10-CM | POA: Insufficient documentation

## 2012-02-28 DIAGNOSIS — R05 Cough: Secondary | ICD-10-CM | POA: Insufficient documentation

## 2012-02-28 DIAGNOSIS — Z79899 Other long term (current) drug therapy: Secondary | ICD-10-CM | POA: Insufficient documentation

## 2012-02-28 DIAGNOSIS — IMO0001 Reserved for inherently not codable concepts without codable children: Secondary | ICD-10-CM | POA: Insufficient documentation

## 2012-02-28 DIAGNOSIS — R112 Nausea with vomiting, unspecified: Secondary | ICD-10-CM | POA: Insufficient documentation

## 2012-02-28 DIAGNOSIS — R059 Cough, unspecified: Secondary | ICD-10-CM | POA: Insufficient documentation

## 2012-02-28 DIAGNOSIS — J029 Acute pharyngitis, unspecified: Secondary | ICD-10-CM | POA: Insufficient documentation

## 2012-02-28 DIAGNOSIS — M129 Arthropathy, unspecified: Secondary | ICD-10-CM | POA: Insufficient documentation

## 2012-02-28 MED ORDER — LIDOCAINE VISCOUS 2 % MT SOLN: 20.0000 mL | OROMUCOSAL | Status: DC | PRN

## 2012-02-28 MED ORDER — BENZONATATE 100 MG PO CAPS
100.0000 mg | ORAL_CAPSULE | Freq: Three times a day (TID) | ORAL | Status: DC
Start: 2012-02-28 — End: 2012-04-15

## 2012-02-28 MED ORDER — AMOXICILLIN 500 MG PO CAPS: 500.0000 mg | ORAL_CAPSULE | Freq: Three times a day (TID) | ORAL | Status: DC

## 2012-02-28 MED ORDER — ONDANSETRON HCL 4 MG PO TABS: 4.0000 mg | ORAL_TABLET | Freq: Four times a day (QID) | ORAL | Status: DC

## 2012-02-28 MED ORDER — IBUPROFEN 800 MG PO TABS: 800.0000 mg | ORAL_TABLET | Freq: Three times a day (TID) | ORAL | Status: DC

## 2012-02-28 NOTE — ED Provider Notes (Signed)
History     CSN: [id number]  Arrival date & time 02/28/12  1118   First MD Initiated Contact with Patient 02/28/12 1211      Chief Complaint  Patient presents with  . Sore Throat  . Emesis    (Consider location/radiation/quality/duration/timing/severity/associated sxs/prior treatment) HPI  Pt has multiple visits to the ER for pain complaints is here for multiple problems. Markedly, she says her throat hurts, she has a cough and feels nauseous. She says her symptoms started a few days ago. She is here with her daughter who is also being seen for a cough and vomiting yesterday. She has not had any fevers. The patient informs me that she has body aches, cough, bilateral ear pain, throat pain, and nasal congestion. Pts vitals in the ER are stable and normal. nad  Past Medical History  Diagnosis Date  . Arthritis   . Back pain     Past Surgical History  Procedure Date  . Cesarean section   . Knee surgery   . Knee surgery     R knee    History reviewed. No pertinent family history.  History  Substance Use Topics  . Smoking status: Current Some Day Smoker -- 0.5 packs/day for 10 years    Types: Cigarettes  . Smokeless tobacco: Never Used  . Alcohol Use: No     Comment: socially    OB History    Grav Para Term Preterm Abortions TAB SAB Ect Mult Living                  Review of Systems  Review of Systems  Gen: no weight loss, fevers, chills, night sweats  Eyes: no discharge or drainage, no occular pain or visual changes  Nose: no epistaxis + rhinorrhea  Mouth: no dental pain, + sore throat  Neck: no neck pain  Lungs:No wheezing,  or hemoptysis +coughing CV: no chest pain, palpitations, dependent edema or orthopnea  Abd: no abdominal pain, + nausea, vomiting  GU: no dysuria or gross hematuria  MSK:  + Myalgias Neuro: no headache, no focal neurologic deficits  Skin: no abnormalities Psyche: negative.   Allergies  Diclofenac sodium; Pyridium; Sulfa  antibiotics; and Tramadol  Home Medications   Current Outpatient Rx  Name  Route  Sig  Dispense  Refill  . CLONAZEPAM 0.5 MG PO TABS   Oral   Take 0.5 mg by mouth 3 (three) times daily as needed. For anxiety         . GLUCOSAMINE-CHONDROITIN 500-400 MG PO TABS   Oral   Take 1 tablet by mouth 3 (three) times daily.         . IBUPROFEN 800 MG PO TABS   Oral   Take 1 tablet (800 mg total) by mouth every 8 (eight) hours as needed for pain.   21 tablet   0   . ADULT MULTIVITAMIN W/MINERALS CH   Oral   Take 1 tablet by mouth daily.         . OXYCODONE-ACETAMINOPHEN 5-325 MG PO TABS   Oral   Take 1 tablet by mouth every 4 (four) hours as needed. Pain         . AMOXICILLIN 500 MG PO CAPS   Oral   Take 1 capsule (500 mg total) by mouth 3 (three) times daily.   21 capsule   0   . BENZONATATE 100 MG PO CAPS   Oral   Take 1 capsule (100 mg total) by mouth  every 8 (eight) hours.   21 capsule   0   . IBUPROFEN 800 MG PO TABS   Oral   Take 1 tablet (800 mg total) by mouth 3 (three) times daily.   21 tablet   0   . LIDOCAINE VISCOUS 2 % MT SOLN   Oral   Take 20 mLs by mouth as needed for pain.   100 mL   0   . ONDANSETRON HCL 4 MG PO TABS   Oral   Take 1 tablet (4 mg total) by mouth every 6 (six) hours.   12 tablet   0     BP 132/67  Pulse 105  Temp 97.9 F (36.6 C) (Oral)  Resp 16  SpO2 98%  LMP 01/15/2012  Physical Exam  Nursing note and vitals reviewed. Constitutional: She appears well-developed and well-nourished. No distress.  HENT:  Head: Normocephalic and atraumatic.  Mouth/Throat: Uvula is midline. Oropharyngeal exudate present. No posterior oropharyngeal edema or posterior oropharyngeal erythema.  Eyes: Pupils are equal, round, and reactive to light.  Neck: Normal range of motion. Neck supple.  Cardiovascular: Normal rate and regular rhythm.   Pulmonary/Chest: Effort normal.  Abdominal: Soft.  Neurological: She is alert.  Skin: Skin is  warm and dry.    ED Course  Procedures (including critical care time)  Labs Reviewed - No data to display No results found.   1. Pharyngitis       MDM  Pt specifically asked for Percocet. She refused Motrin, Tylenol, Tramadol, Baclofen.   Pt had a normal exam aside from exudates on tonsils. Many family members in the house are sick with the same thing.   DX Rx- viscous lido, tessalon perls, zofran and amoxicillin.  Pt needs to find and follow-up with PCP/  Pt has been advised of the symptoms that warrant their return to the ED. Patient has voiced understanding and has agreed to follow-up with the PCP or specialist.      Dorthula Matas, PA 02/28/12 1410

## 2012-02-28 NOTE — ED Notes (Signed)
Patient c/o left ear pain, sore throat, and vomiting since yesterday.  Patient reports chills but denies fever.

## 2012-02-28 NOTE — ED Provider Notes (Signed)
Medical screening examination/treatment/procedure(s) were performed by non-physician practitioner and as supervising physician I was immediately available for consultation/collaboration.  Flint Melter, MD 02/28/12 [phone]

## 2012-03-01 ENCOUNTER — Emergency Department (HOSPITAL_COMMUNITY)
Admission: EM | Admit: 2012-03-01 | Discharge: 2012-03-01 | Disposition: A | Discharge status: Home / Self Care | Payer: Medicaid Other | Attending: Emergency Medicine | Admitting: Emergency Medicine

## 2012-03-01 ENCOUNTER — Encounter (HOSPITAL_COMMUNITY): Payer: Self-pay

## 2012-03-01 DIAGNOSIS — F17200 Nicotine dependence, unspecified, uncomplicated: Secondary | ICD-10-CM | POA: Insufficient documentation

## 2012-03-01 DIAGNOSIS — G8929 Other chronic pain: Secondary | ICD-10-CM

## 2012-03-01 DIAGNOSIS — K1370 Unspecified lesions of oral mucosa: Secondary | ICD-10-CM | POA: Insufficient documentation

## 2012-03-01 DIAGNOSIS — Z79899 Other long term (current) drug therapy: Secondary | ICD-10-CM | POA: Insufficient documentation

## 2012-03-01 DIAGNOSIS — Z8739 Personal history of other diseases of the musculoskeletal system and connective tissue: Secondary | ICD-10-CM | POA: Insufficient documentation

## 2012-03-01 DIAGNOSIS — K089 Disorder of teeth and supporting structures, unspecified: Secondary | ICD-10-CM | POA: Insufficient documentation

## 2012-03-01 MED ORDER — BUPIVACAINE HCL (PF) 0.5 % IJ SOLN
10.0000 mL | Freq: Once | INTRAMUSCULAR | Status: AC
  Filled 2012-03-01: qty 30

## 2012-03-01 MED ORDER — BUPIVACAINE-EPINEPHRINE PF 0.5-1:200000 % IJ SOLN
INTRAMUSCULAR | Status: AC
  Administered 2012-03-01: 08:00:00
  Filled 2012-03-01: qty 1.8

## 2012-03-01 MED ORDER — OXYCODONE-ACETAMINOPHEN 5-325 MG PO TABS: 1.0000 | ORAL_TABLET | ORAL | Status: DC | PRN

## 2012-03-01 MED ORDER — CHLORHEXIDINE GLUCONATE 0.12 % MT SOLN: 15.0000 mL | Freq: Two times a day (BID) | OROMUCOSAL | Status: DC

## 2012-03-01 NOTE — ED Provider Notes (Signed)
History     CSN: [id number]  Arrival date & time 03/01/12  7829   First MD Initiated Contact with Patient 03/01/12 [phone]      Chief Complaint  Patient presents with  . Dental Pain  . Oral Swelling    (Consider location/radiation/quality/duration/timing/severity/associated sxs/prior treatment) HPI 46 year old female with recent history of dental abscess presents to emergency department with dental pain.  Onset was 3 days ago.  Patient noticed drainage of pus from her gums this morning while brushing her teeth.  States that she tried to get followup with her dentist but that he would not see her until she has taken 7 days of amoxicillin.  She is currently taking amoxicillin for pharyngitis.  Patient complains of headache sinus pain on the left side for her tooth pain is.  She denies difficulty breathing or swallowing.  She denies fevers chills myalgias arthralgias or malaise.  Denies fevers, chills, myalgias, arthralgias. Denies DOE, SOB, chest tightness or pressure, radiation to left arm, jaw or back, or diaphoresis. Denies dysuria, flank pain, suprapubic pain, frequency, urgency, or hematuria. Denies headaches, light headedness, weakness, visual disturbances. Denies abdominal pain, nausea, vomiting, diarrhea or constipation.    Past Medical History  Diagnosis Date  . Arthritis   . Back pain     Past Surgical History  Procedure Date  . Cesarean section   . Knee surgery   . Knee surgery     R knee    History reviewed. No pertinent family history.  History  Substance Use Topics  . Smoking status: Current Some Day Smoker -- 0.5 packs/day for 10 years    Types: Cigarettes  . Smokeless tobacco: Never Used  . Alcohol Use: No     Comment: socially    OB History    Grav Para Term Preterm Abortions TAB SAB Ect Mult Living                  Review of Systems Ten systems reviewed and are negative for acute change, except as noted in the HPI.   Allergies  Diclofenac  sodium; Pyridium; Sulfa antibiotics; and Tramadol  Home Medications   Current Outpatient Rx  Name  Route  Sig  Dispense  Refill  . AMOXICILLIN 500 MG PO CAPS   Oral   Take 1 capsule (500 mg total) by mouth 3 (three) times daily.   21 capsule   0   . BENZONATATE 100 MG PO CAPS   Oral   Take 1 capsule (100 mg total) by mouth every 8 (eight) hours.   21 capsule   0   . CLONAZEPAM 0.5 MG PO TABS   Oral   Take 0.5 mg by mouth 3 (three) times daily as needed. For anxiety         . GLUCOSAMINE-CHONDROITIN 500-400 MG PO TABS   Oral   Take 1 tablet by mouth 3 (three) times daily.         . IBUPROFEN 800 MG PO TABS   Oral   Take 1 tablet (800 mg total) by mouth every 8 (eight) hours as needed for pain.   21 tablet   0   . LIDOCAINE VISCOUS 2 % MT SOLN   Oral   Take 20 mLs by mouth as needed for pain.   100 mL   0   . ADULT MULTIVITAMIN W/MINERALS CH   Oral   Take 1 tablet by mouth daily.         [name] ONDANSETRON HCL  4 MG PO TABS   Oral   Take 1 tablet (4 mg total) by mouth every 6 (six) hours.   12 tablet   0     BP 111/66  Pulse 100  Temp 98.2 F (36.8 C) (Oral)  Resp 16  SpO2 96%  LMP 03/01/2012  Physical Exam  Constitutional: She is oriented to person, place, and time. She appears well-developed and well-nourished. She appears distressed (appears moderately distressed).  HENT:  Head: Normocephalic and atraumatic. No trismus in the jaw.  Mouth/Throat: Uvula is midline. She does not have dentures. No oral lesions. Abnormal dentition. Dental caries present. No dental abscesses, uvula swelling or lacerations.  Eyes: Conjunctivae normal are normal. No scleral icterus.  Neck: Normal range of motion.  Cardiovascular: Normal rate, regular rhythm and normal heart sounds.  Exam reveals no gallop and no friction rub.   No murmur heard. Pulmonary/Chest: Effort normal and breath sounds normal. No respiratory distress.  Abdominal: Soft. Bowel sounds are normal. She  exhibits no distension and no mass. There is no tenderness. There is no guarding.  Neurological: She is alert and oriented to person, place, and time.  Skin: Skin is warm and dry. She is not diaphoretic.   ED Course  Procedures (including critical care time) Dental Performed by: Arthor Captain Authorized by: Arthor Captain Consent: Verbal consent obtained. Patient understanding: patient states understanding of the procedure being performed Patient identity confirmed: verbally with patient Local anesthesia used: yes Local anesthetic: bupivacaine 0.5% with epinephrine Anesthetic total: 1 ml Patient sedated: no Patient tolerance: Patient tolerated the procedure well with no immediate complications.    Labs Reviewed - No data to display No results found.   1. Chronic dental pain       MDM  Patient with toothache.  No gross abscess.  Exam unconcerning for Ludwig's angina or spread of infection.  She may take her prescribed amoxicillin and pain medicine and peridex mouthwash.  Urged patient to follow-up with dentist.          Arthor Captain, PA-C 03/01/12 [phone]

## 2012-03-01 NOTE — ED Notes (Addendum)
Patient reports that she was seen in the ED 2 days ago and was given Amoxicillin, Ibuprofen and a mouthwash. Patient states she is still having pain, and drainage from left upper gums. Patient also c/o sore throat, but denies difficulty breathing or fever.

## 2012-03-02 NOTE — ED Provider Notes (Signed)
Medical screening examination/treatment/procedure(s) were performed by non-physician practitioner and as supervising physician I was immediately available for consultation/collaboration.   Amante Fomby M Neils [place], MD 03/02/12 0407 

## 2012-03-05 ENCOUNTER — Emergency Department (HOSPITAL_COMMUNITY)
Admission: EM | Admit: 2012-03-05 | Discharge: 2012-03-05 | Disposition: A | Discharge status: Home / Self Care | Payer: Medicaid Other | Attending: Emergency Medicine | Admitting: Emergency Medicine

## 2012-03-05 ENCOUNTER — Encounter (HOSPITAL_COMMUNITY): Payer: Self-pay | Admitting: Emergency Medicine

## 2012-03-05 DIAGNOSIS — K029 Dental caries, unspecified: Secondary | ICD-10-CM | POA: Insufficient documentation

## 2012-03-05 DIAGNOSIS — F17200 Nicotine dependence, unspecified, uncomplicated: Secondary | ICD-10-CM | POA: Insufficient documentation

## 2012-03-05 DIAGNOSIS — Z79899 Other long term (current) drug therapy: Secondary | ICD-10-CM | POA: Insufficient documentation

## 2012-03-05 DIAGNOSIS — M129 Arthropathy, unspecified: Secondary | ICD-10-CM | POA: Insufficient documentation

## 2012-03-05 MED ORDER — HYDROCODONE-ACETAMINOPHEN 5-325 MG PO TABS: 1.0000 | ORAL_TABLET | Freq: Four times a day (QID) | ORAL | Status: DC | PRN

## 2012-03-05 NOTE — ED Notes (Signed)
Patient reports that the she was here recently for same. The patient reports that the pain has worsened and that a piece of her tooth has chipped off

## 2012-03-05 NOTE — ED Provider Notes (Signed)
History     CSN: [id number]  Arrival date & time 03/05/12  3086   First MD Initiated Contact with Patient 03/05/12 0820      Chief Complaint  Patient presents with  . Dental Pain    (Consider location/radiation/quality/duration/timing/severity/associated sxs/prior treatment) HPI Patient presents to the emergency department for continued dental pain.  Patient, states she was here 5 days ago, and received an injection into her tooth.  Patient, states she ran out of pain medicine yesterday, and she does have an appointment with her dentist on Monday.  Patient, states that she did crack a small piece of tooth yesterday.  Patient, states that she's not had any fevers, nausea, vomiting, difficulty swallowing, difficulty breathing, swelling of the neck or under the tongue.  Past Medical History  Diagnosis Date  . Arthritis   . Back pain     Past Surgical History  Procedure Date  . Cesarean section   . Knee surgery   . Knee surgery     R knee    History reviewed. No pertinent family history.  History  Substance Use Topics  . Smoking status: Current Some Day Smoker -- 0.5 packs/day for 10 years    Types: Cigarettes  . Smokeless tobacco: Never Used  . Alcohol Use: No     Comment: socially    OB History    Grav Para Term Preterm Abortions TAB SAB Ect Mult Living                  Review of Systems All other systems negative except as documented in the HPI. All pertinent positives and negatives as reviewed in the HPI.  Allergies  Diclofenac sodium; Pyridium; Sulfa antibiotics; and Tramadol  Home Medications   Current Outpatient Rx  Name  Route  Sig  Dispense  Refill  . AMOXICILLIN 500 MG PO CAPS   Oral   Take 500 mg by mouth 3 (three) times daily. x7 days starting 12/22         . BENZOCAINE 10 % MT GEL   Mouth/Throat   Use as directed 1 application in the mouth or throat as needed. pain         . BENZONATATE 100 MG PO CAPS   Oral   Take 1 capsule (100 mg  total) by mouth every 8 (eight) hours.   21 capsule   0   . CHLORHEXIDINE GLUCONATE 0.12 % MT SOLN   Mouth/Throat   Use as directed 15 mLs in the mouth or throat 2 (two) times daily.   120 mL   0   . CLONAZEPAM 0.5 MG PO TABS   Oral   Take 0.5 mg by mouth 3 (three) times daily as needed. For anxiety         . GLUCOSAMINE-CHONDROITIN 500-400 MG PO TABS   Oral   Take 1 tablet by mouth 3 (three) times daily.         . IBUPROFEN 800 MG PO TABS   Oral   Take 1 tablet (800 mg total) by mouth every 8 (eight) hours as needed for pain.   21 tablet   0   . LIDOCAINE VISCOUS 2 % MT SOLN   Oral   Take 20 mLs by mouth as needed for pain.   100 mL   0   . ADULT MULTIVITAMIN W/MINERALS CH   Oral   Take 1 tablet by mouth daily.         [name] ONDANSETRON HCL 4  MG PO TABS   Oral   Take 1 tablet (4 mg total) by mouth every 6 (six) hours.   12 tablet   0   . OXYCODONE-ACETAMINOPHEN 5-325 MG PO TABS   Oral   Take 1 tablet by mouth every 4 (four) hours as needed for pain.   20 tablet   0     BP 139/76  Pulse 97  Temp 98.7 F (37.1 C) (Oral)  Resp 18  SpO2 100%  LMP 03/01/2012  Physical Exam  Nursing note and vitals reviewed. Constitutional: She appears well-developed and well-nourished. No distress.  HENT:  Head: No trismus in the jaw.  Mouth/Throat: Uvula is midline and oropharynx is clear and moist. Abnormal dentition. Dental caries present. No dental abscesses or uvula swelling.  Neck: Normal range of motion. Neck supple.  Cardiovascular: Normal rate and regular rhythm.   Pulmonary/Chest: Effort normal.    ED Course  Procedures   Patient, is advised to continue with her appointment with her dentist on Monday.  She's also advised to return here as needed.  Rinse with warm water and peroxide 3 times a day. MDM         Carlyle Dolly, PA-C 03/05/12 [phone]

## 2012-03-05 NOTE — ED Provider Notes (Signed)
Medical screening examination/treatment/procedure(s) were performed by non-physician practitioner and as supervising physician I was immediately available for consultation/collaboration.   Lyanne Co, MD 03/05/12 [phone]

## 2012-03-26 ENCOUNTER — Encounter (HOSPITAL_COMMUNITY): Payer: Self-pay | Admitting: Emergency Medicine

## 2012-03-26 ENCOUNTER — Emergency Department (HOSPITAL_COMMUNITY)
Admission: EM | Admit: 2012-03-26 | Discharge: 2012-03-26 | Disposition: A | Discharge status: Home / Self Care | Payer: Medicaid Other | Attending: Emergency Medicine | Admitting: Emergency Medicine

## 2012-03-26 ENCOUNTER — Emergency Department (HOSPITAL_COMMUNITY): Payer: Medicaid Other

## 2012-03-26 DIAGNOSIS — Y9389 Activity, other specified: Secondary | ICD-10-CM | POA: Insufficient documentation

## 2012-03-26 DIAGNOSIS — F17200 Nicotine dependence, unspecified, uncomplicated: Secondary | ICD-10-CM | POA: Insufficient documentation

## 2012-03-26 DIAGNOSIS — W19XXXA Unspecified fall, initial encounter: Secondary | ICD-10-CM

## 2012-03-26 DIAGNOSIS — S299XXA Unspecified injury of thorax, initial encounter: Secondary | ICD-10-CM

## 2012-03-26 DIAGNOSIS — S139XXA Sprain of joints and ligaments of unspecified parts of neck, initial encounter: Secondary | ICD-10-CM | POA: Insufficient documentation

## 2012-03-26 DIAGNOSIS — S161XXA Strain of muscle, fascia and tendon at neck level, initial encounter: Secondary | ICD-10-CM

## 2012-03-26 DIAGNOSIS — Y9229 Other specified public building as the place of occurrence of the external cause: Secondary | ICD-10-CM | POA: Insufficient documentation

## 2012-03-26 DIAGNOSIS — Z9889 Other specified postprocedural states: Secondary | ICD-10-CM | POA: Insufficient documentation

## 2012-03-26 DIAGNOSIS — M129 Arthropathy, unspecified: Secondary | ICD-10-CM | POA: Insufficient documentation

## 2012-03-26 DIAGNOSIS — Z79899 Other long term (current) drug therapy: Secondary | ICD-10-CM | POA: Insufficient documentation

## 2012-03-26 DIAGNOSIS — M549 Dorsalgia, unspecified: Secondary | ICD-10-CM | POA: Insufficient documentation

## 2012-03-26 DIAGNOSIS — S298XXA Other specified injuries of thorax, initial encounter: Secondary | ICD-10-CM | POA: Insufficient documentation

## 2012-03-26 DIAGNOSIS — R296 Repeated falls: Secondary | ICD-10-CM | POA: Insufficient documentation

## 2012-03-26 MED ORDER — ACETAMINOPHEN 500 MG PO TABS: 500.0000 mg | ORAL_TABLET | Freq: Four times a day (QID) | ORAL | Status: DC | PRN

## 2012-03-26 MED ORDER — KETOROLAC TROMETHAMINE 60 MG/2ML IM SOLN
60.0000 mg | Freq: Once | INTRAMUSCULAR | Status: AC
  Administered 2012-03-26: 60 mg via INTRAMUSCULAR
  Filled 2012-03-26: qty 2

## 2012-03-26 MED ORDER — DIAZEPAM 5 MG PO TABS
5.0000 mg | ORAL_TABLET | Freq: Once | ORAL | Status: AC
  Administered 2012-03-26: 5 mg via ORAL
  Filled 2012-03-26: qty 1

## 2012-03-26 MED ORDER — CYCLOBENZAPRINE HCL 10 MG PO TABS: 10.0000 mg | ORAL_TABLET | Freq: Two times a day (BID) | ORAL | Status: DC | PRN

## 2012-03-26 NOTE — ED Notes (Signed)
Pt complains of right side abdominal pain. Pt reports falling last night, denies LOC at this time. Pain worse with coughing and sneezing.

## 2012-03-26 NOTE — ED Provider Notes (Signed)
Medical screening examination/treatment/procedure(s) were performed by non-physician practitioner and as supervising physician I was immediately available for consultation/collaboration.   Lyanne Co, MD 03/26/12 [phone]

## 2012-03-26 NOTE — ED Notes (Signed)
Patient returned from X-ray 

## 2012-03-26 NOTE — ED Notes (Signed)
Patient transported to X-ray 

## 2012-03-26 NOTE — ED Notes (Signed)
MD at bedside. 

## 2012-03-26 NOTE — ED Provider Notes (Signed)
History     CSN: [id number]  Arrival date & time 03/26/12  0917   First MD Initiated Contact with Patient 03/26/12 [phone]      Chief Complaint  Patient presents with  . Fall    (Consider location/radiation/quality/duration/timing/severity/associated sxs/prior treatment) HPI Comments: Patient is a 47 year old female who presents after a mechanical fall last night when she was taking her garbage can out. She fell, landing on her right side. She suddenly has right rib pain that is throbbing and severe. She gradually developed right side neck pain after the fall which is sore and moderate. The pain is made worse with movement and palpation. No alleviating factors. No associated symptoms.    Past Medical History  Diagnosis Date  . Arthritis   . Back pain     Past Surgical History  Procedure Date  . Cesarean section   . Knee surgery   . Knee surgery     R knee    No family history on file.  History  Substance Use Topics  . Smoking status: Current Some Day Smoker -- 0.5 packs/day for 10 years    Types: Cigarettes  . Smokeless tobacco: Never Used  . Alcohol Use: No     Comment: socially    OB History    Grav Para Term Preterm Abortions TAB SAB Ect Mult Living                  Review of Systems  HENT: Positive for neck pain.   Cardiovascular: Positive for chest pain.  All other systems reviewed and are negative.    Allergies  Diclofenac sodium; Pyridium; Sulfa antibiotics; and Tramadol  Home Medications   Current Outpatient Rx  Name  Route  Sig  Dispense  Refill  . BENZONATATE 100 MG PO CAPS   Oral   Take 1 capsule (100 mg total) by mouth every 8 (eight) hours.   21 capsule   0   . GLUCOSAMINE-CHONDROITIN 500-400 MG PO TABS   Oral   Take 1 tablet by mouth 3 (three) times daily.         . IBUPROFEN 800 MG PO TABS   Oral   Take 1 tablet (800 mg total) by mouth every 8 (eight) hours as needed for pain.   21 tablet   0   . ADULT MULTIVITAMIN  W/MINERALS CH   Oral   Take 1 tablet by mouth daily.         [name] BENZOCAINE 10 % MT GEL   Mouth/Throat   Use as directed 1 application in the mouth or throat as needed. pain         . CLONAZEPAM 0.5 MG PO TABS   Oral   Take 0.5 mg by mouth 3 (three) times daily as needed. For anxiety           BP 111/52  Pulse 112  Temp 98.1 F (36.7 C) (Oral)  Resp 16  SpO2 95%  LMP 03/01/2012  Physical Exam  Nursing note and vitals reviewed. Constitutional: She is oriented to person, place, and time. She appears well-developed and well-nourished. No distress.  HENT:  Head: Normocephalic and atraumatic.  Eyes: Conjunctivae normal are normal.  Neck: Normal range of motion. Neck supple.       Tenderness to palpation of paraspinal area, mostly on the right.   Cardiovascular: Normal rate and regular rhythm.  Exam reveals no gallop and no friction rub.   No murmur heard. Pulmonary/Chest: Effort  normal and breath sounds normal. She has no wheezes. She has no rales. She exhibits tenderness.       Right lower ribs tenderness to palpation. No bruising or deformity noted.   Abdominal: Soft. There is no tenderness.  Musculoskeletal: Normal range of motion.       See neck. No midline spine tenderness to palpation or stepoff noted.   Neurological: She is alert and oriented to person, place, and time. Coordination abnormal.       Speech is goal-oriented. Moves limbs without ataxia.   Skin: Skin is warm and dry.  Psychiatric: She has a normal mood and affect. Her behavior is normal.    ED Course  Procedures (including critical care time)  Labs Reviewed - No data to display Dg Ribs Unilateral W/chest Right  03/26/2012  *RADIOLOGY REPORT*  Clinical Data: Fall.  Chest pain  RIGHT RIBS AND CHEST - 3+ VIEW  Comparison: 01/20/2012  Findings: The lungs are clear without infiltrate or effusion.  No pneumothorax.  Negative for rib fracture on the right.  IMPRESSION: Negative   Original Report  Authenticated By: Janeece Riggers, M.D.      1. Fall   2. Strain of neck muscle   3. Rib injury       MDM  9:52 AM Patient will have toradol and valium for pain. Unilateral right rib imaging pending.   10:34 AM Images unremarkable for fracture. Patient likely has muscle strain and mild bruising. Patient will have Flexeril and tylenol for pain. No further evaluation needed at this time.        Emilia Beck, PA-C 03/26/12 1310

## 2012-04-15 ENCOUNTER — Emergency Department (HOSPITAL_COMMUNITY)
Admission: EM | Admit: 2012-04-15 | Discharge: 2012-04-15 | Disposition: A | Discharge status: Home / Self Care | Payer: Medicaid Other | Attending: Emergency Medicine | Admitting: Emergency Medicine

## 2012-04-15 ENCOUNTER — Encounter (HOSPITAL_COMMUNITY): Payer: Self-pay | Admitting: *Deleted

## 2012-04-15 DIAGNOSIS — S0993XA Unspecified injury of face, initial encounter: Secondary | ICD-10-CM | POA: Insufficient documentation

## 2012-04-15 DIAGNOSIS — M542 Cervicalgia: Secondary | ICD-10-CM

## 2012-04-15 DIAGNOSIS — Z79899 Other long term (current) drug therapy: Secondary | ICD-10-CM | POA: Insufficient documentation

## 2012-04-15 DIAGNOSIS — Y929 Unspecified place or not applicable: Secondary | ICD-10-CM | POA: Insufficient documentation

## 2012-04-15 DIAGNOSIS — M129 Arthropathy, unspecified: Secondary | ICD-10-CM | POA: Insufficient documentation

## 2012-04-15 DIAGNOSIS — S199XXA Unspecified injury of neck, initial encounter: Secondary | ICD-10-CM | POA: Insufficient documentation

## 2012-04-15 DIAGNOSIS — Y939 Activity, unspecified: Secondary | ICD-10-CM | POA: Insufficient documentation

## 2012-04-15 DIAGNOSIS — G8929 Other chronic pain: Secondary | ICD-10-CM | POA: Insufficient documentation

## 2012-04-15 DIAGNOSIS — R51 Headache: Secondary | ICD-10-CM | POA: Insufficient documentation

## 2012-04-15 DIAGNOSIS — W010XXA Fall on same level from slipping, tripping and stumbling without subsequent striking against object, initial encounter: Secondary | ICD-10-CM | POA: Insufficient documentation

## 2012-04-15 DIAGNOSIS — M549 Dorsalgia, unspecified: Secondary | ICD-10-CM | POA: Insufficient documentation

## 2012-04-15 DIAGNOSIS — F17200 Nicotine dependence, unspecified, uncomplicated: Secondary | ICD-10-CM | POA: Insufficient documentation

## 2012-04-15 MED ORDER — OXYCODONE-ACETAMINOPHEN 5-325 MG PO TABS: 1.0000 | ORAL_TABLET | Freq: Four times a day (QID) | ORAL | Status: DC | PRN

## 2012-04-15 NOTE — ED Notes (Signed)
Pt states had a fall on ice last Saturday; hit head/back; previous history of neck and back pain; neg loc; continued headache and neck pain; was referred to pain management clinic but no appt for 2 wks (previous history of falls)

## 2012-04-15 NOTE — ED Provider Notes (Signed)
Medical screening examination/treatment/procedure(s) were performed by non-physician practitioner and as supervising physician I was immediately available for consultation/collaboration.   Peachie Barkalow R Damion Kant, MD 04/15/12 2300 

## 2012-04-15 NOTE — ED Provider Notes (Signed)
History   This chart was scribed for non-physician practitioner working with Celene Kras, MD by Smitty Pluck. This patient was seen in room WTR8/WTR8 and the patient's care was started at 8:00 PM.   CSN: [id number]  Arrival date & time 04/15/12  1844      No chief complaint on file. CC: neck pain  The history is provided by the patient. No language interpreter was used.   Terri White is a 47 y.o. female with hx of chronic neck and back pain who presents to the Emergency Department complaining of moderate, constant neck pain radiating to upper back and headache onset 4 days ago after falling on ice. She reports that she is awaiting appointment at pain clinic in 2 weeks. She reports that has taken bc powder and ibuprofen without relief. Movement of shoulders aggravates the pain. She denies weakness in upper extremities, numbness, fever, chills, nausea, vomiting, diarrhea, weakness, cough, SOB and any other pain. Nothing makes pain better.     Past Medical History  Diagnosis Date  . Arthritis   . Back pain     Past Surgical History  Procedure Date  . Cesarean section   . Knee surgery   . Knee surgery     R knee    No family history on file.  History  Substance Use Topics  . Smoking status: Current Some Day Smoker -- 0.5 packs/day for 10 years    Types: Cigarettes  . Smokeless tobacco: Never Used  . Alcohol Use: No     Comment: socially    OB History    Grav Para Term Preterm Abortions TAB SAB Ect Mult Living                  Review of Systems  Constitutional: Negative for fever, chills and unexpected weight change.  HENT: Positive for neck pain. Negative for neck stiffness.   Respiratory: Negative for shortness of breath.   Gastrointestinal: Negative for nausea, vomiting and constipation.       Negative for fecal incontinence.   Genitourinary: Negative for dysuria, hematuria, flank pain, vaginal bleeding, vaginal discharge and pelvic pain.       Negative for  urinary incontinence or retention.  Neurological: Positive for headaches. Negative for weakness and numbness.       Denies saddle paresthesias.    Allergies  Diclofenac sodium; Pyridium; Sulfa antibiotics; and Tramadol  Home Medications   Current Outpatient Rx  Name  Route  Sig  Dispense  Refill  . GOODY HEADACHE PO   Oral   Take 2 packets by mouth once.         . CLONAZEPAM 0.5 MG PO TABS   Oral   Take 0.5 mg by mouth 3 (three) times daily as needed. For anxiety         . GLUCOSAMINE-CHONDROITIN 500-400 MG PO TABS   Oral   Take 1 tablet by mouth 3 (three) times daily.         . IBUPROFEN 200 MG PO TABS   Oral   Take 400 mg by mouth every 8 (eight) hours as needed. For pain.         . ADULT MULTIVITAMIN W/MINERALS CH   Oral   Take 1 tablet by mouth daily.           BP 109/64  Pulse 89  Temp 98.3 F (36.8 C)  Resp 20  SpO2 99%  LMP 03/26/2012  Physical Exam  Nursing note and vitals  reviewed. Constitutional: She is oriented to person, place, and time. She appears well-developed and well-nourished. No distress.  HENT:  Head: Normocephalic and atraumatic.  Eyes: Conjunctivae normal and EOM are normal.  Neck: Normal range of motion. Neck supple. No tracheal deviation present.       Tender over cervical paraspinal muscles.   Cardiovascular: Normal rate.   Pulmonary/Chest: Effort normal. No respiratory distress.  Abdominal: Soft. There is no tenderness. There is no CVA tenderness.  Musculoskeletal: Normal range of motion.       Cervical back: She exhibits tenderness and bony tenderness. She exhibits normal range of motion.       Thoracic back: She exhibits normal range of motion, no tenderness and no bony tenderness.       Lumbar back: She exhibits normal range of motion, no tenderness and no bony tenderness.       No step-off noted with palpation of spine.   Neurological: She is alert and oriented to person, place, and time. She has normal strength and  normal reflexes. No cranial nerve deficit or sensory deficit. Coordination normal.       5/5 strength in entire upper and lower extremities bilaterally. No sensation deficit.   Skin: Skin is warm and dry. No rash noted.  Psychiatric: She has a normal mood and affect. Her behavior is normal.    ED Course  Procedures (including critical care time) DIAGNOSTIC STUDIES: Oxygen Saturation is 99% on room air, normal by my interpretation.    COORDINATION OF CARE: 8:02 PM Discussed ED treatment with pt and pt agrees.     Labs Reviewed - No data to display No results found.   1. Neck pain   2. Chronic pain     Patient seen and examined. Medications ordered.   Vital signs reviewed and are as follows: Filed Vitals:   04/15/12 1927  BP: 109/64  Pulse: 89  Temp: 98.3 F (36.8 C)  Resp: 20    No red flag s/s of back pain. Patient was counseled on back/neck pain precautions and told to do activity as tolerated but do not lift, push, or pull heavy objects more than 10 pounds for the next week.  Patient counseled to use ice or heat on back for no longer than 15 minutes every hour.   Patient prescribed muscle relaxer and counseled on proper use of muscle relaxant medication.    Patient prescribed narcotic pain medicine and counseled on proper use of narcotic pain medications. Counseled not to combine this medication with others containing tylenol.   Urged patient not to drink alcohol, drive, or perform any other activities that requires focus while taking either of these medications.  Patient urged to follow-up with PCP if pain does not improve with treatment and rest or if pain becomes recurrent. Urged to return with worsening severe pain, loss of bowel or bladder control, trouble walking.   The patient verbalizes understanding and agrees with the plan.     MDM  Patient with neck pain. No neurological deficits or UE radicular sx. Patient is ambulatory. No warning symptoms of back  pain including: loss of bowel or bladder control, night sweats, waking from sleep with back pain, unexplained fevers or weight loss, h/o cancer, IVDU, recent trauma. No concern for cauda equina, epidural abscess, or other serious cause of back pain. Conservative measures such as rest, ice/heat and pain medicine indicated with PCP follow-up if no improvement with conservative management.        I personally  performed the services described in this documentation, which was scribed in my presence. The recorded information has been reviewed and is accurate.    Renne Crigler, [place] 04/15/12 2209

## 2012-04-15 NOTE — ED Notes (Signed)
rx x 1 for percocet- pt ambulatory without need for assist at time of d/c

## 2012-04-30 ENCOUNTER — Encounter (HOSPITAL_COMMUNITY): Payer: Self-pay | Admitting: Emergency Medicine

## 2012-04-30 ENCOUNTER — Emergency Department (HOSPITAL_COMMUNITY)
Admission: EM | Admit: 2012-04-30 | Discharge: 2012-04-30 | Disposition: A | Discharge status: Home / Self Care | Payer: Medicaid Other | Attending: Emergency Medicine | Admitting: Emergency Medicine

## 2012-04-30 DIAGNOSIS — F411 Generalized anxiety disorder: Secondary | ICD-10-CM | POA: Insufficient documentation

## 2012-04-30 DIAGNOSIS — Z760 Encounter for issue of repeat prescription: Secondary | ICD-10-CM | POA: Insufficient documentation

## 2012-04-30 DIAGNOSIS — Z8739 Personal history of other diseases of the musculoskeletal system and connective tissue: Secondary | ICD-10-CM | POA: Insufficient documentation

## 2012-04-30 DIAGNOSIS — Z79899 Other long term (current) drug therapy: Secondary | ICD-10-CM | POA: Insufficient documentation

## 2012-04-30 DIAGNOSIS — F17200 Nicotine dependence, unspecified, uncomplicated: Secondary | ICD-10-CM | POA: Insufficient documentation

## 2012-04-30 DIAGNOSIS — M129 Arthropathy, unspecified: Secondary | ICD-10-CM | POA: Insufficient documentation

## 2012-04-30 HISTORY — DX: Anxiety disorder, unspecified: F41.9

## 2012-04-30 MED ORDER — CLONAZEPAM 0.5 MG PO TABS: 0.5000 mg | ORAL_TABLET | Freq: Three times a day (TID) | ORAL | Status: DC | PRN

## 2012-04-30 NOTE — ED Provider Notes (Signed)
History     CSN: [id number]  Arrival date & time 04/30/12  4098   First MD Initiated Contact with Patient 04/30/12 1015      Chief Complaint  Patient presents with  . Anxiety  . Medication Refill    (Consider location/radiation/quality/duration/timing/severity/associated sxs/prior treatment) HPI Comments: Pt states her doctor left the practice she was planning to see for a refill. If we can give her 3 days, she can make it last to her new appointment at [facility] where she has appt on 05/08/12.  Patient is a 47 y.o. female presenting with med refill for chronic anxiety Severity: Moderate  Onset quality: Gradual  Duration: 3 days Timing: Constant  Progression: worsening Relieved by: klonopin Worsened by: Nothing tried  Ineffective treatments: None tried    Patient is a 47 y.o. female presenting with anxiety.  Anxiety Pertinent negatives include no chest pain, diaphoresis, fever, headaches, nausea, neck pain, numbness, rash, vomiting or weakness.    Past Medical History  Diagnosis Date  . Arthritis   . Back pain   . Anxiety     Past Surgical History  Procedure Laterality Date  . Cesarean section    . Knee surgery    . Knee surgery      R knee    No family history on file.  History  Substance Use Topics  . Smoking status: Current Some Day Smoker -- 0.50 packs/day for 10 years    Types: Cigarettes  . Smokeless tobacco: Never Used  . Alcohol Use: No     Comment: socially    OB History   Grav Para Term Preterm Abortions TAB SAB Ect Mult Living                  Review of Systems  Constitutional: Negative for fever and diaphoresis.  HENT: Negative for neck pain and neck stiffness.   Eyes: Negative for visual disturbance.  Respiratory: Negative for apnea, chest tightness and shortness of breath.   Cardiovascular: Negative for chest pain and palpitations.  Gastrointestinal: Negative for nausea, vomiting, diarrhea and constipation.   Genitourinary: Negative for dysuria.  Musculoskeletal: Negative for gait problem.  Skin: Negative for rash.  Neurological: Negative for dizziness, weakness, light-headedness, numbness and headaches.  Psychiatric/Behavioral:       Anxious    Allergies  Diclofenac sodium; Pyridium; Sulfa antibiotics; and Tramadol  Home Medications   Current Outpatient Rx  Name  Route  Sig  Dispense  Refill  . Aspirin-Acetaminophen-Caffeine (GOODY HEADACHE PO)   Oral   Take 2 packets by mouth once.         . clonazePAM (KLONOPIN) 0.5 MG tablet   Oral   Take 0.5 mg by mouth 3 (three) times daily as needed. For anxiety         . glucosamine-chondroitin 500-400 MG tablet   Oral   Take 1 tablet by mouth 3 (three) times daily.         [name] ibuprofen (ADVIL,MOTRIN) 200 MG tablet   Oral   Take 400 mg by mouth every 8 (eight) hours as needed. For pain.         . Multiple Vitamin (MULITIVITAMIN WITH MINERALS) TABS   Oral   Take 1 tablet by mouth daily.         [name] oxyCODONE-acetaminophen (PERCOCET/ROXICET) 5-325 MG per tablet   Oral   Take 1 tablet by mouth every 6 (six) hours as needed for pain.   8 tablet   0  BP 129/45  Pulse 94  Temp(Src) 97.6 F (36.4 C) (Oral)  SpO2 98%  Physical Exam  Nursing note and vitals reviewed. Constitutional: She is oriented to person, place, and time. She appears well-developed and well-nourished. No distress.  HENT:  Head: Normocephalic and atraumatic.  Eyes: EOM are normal. Pupils are equal, round, and reactive to light.  Neck: Normal range of motion. Neck supple.  No meningeal signs  Cardiovascular: Normal rate, regular rhythm and normal heart sounds.  Exam reveals no gallop and no friction rub.   No murmur heard. Pulmonary/Chest: Effort normal and breath sounds normal. No respiratory distress. She has no wheezes. She has no rales. She exhibits no tenderness.  Abdominal: Soft. Bowel sounds are normal. She exhibits no distension. There is no  tenderness. There is no rebound and no guarding.  Musculoskeletal: Normal range of motion. She exhibits no edema and no tenderness.  Neurological: She is alert and oriented to person, place, and time. No cranial nerve deficit.  Skin: Skin is warm and dry. She is not diaphoretic. No erythema.  Psychiatric:  psych    ED Course  Procedures (including critical care time)  Labs Reviewed - No data to display No results found.   Diagnosis: med refill    MDM  PE benign. Pt needs 3 days of klonopin until she can get to her new MD. Asked her how 3 days can sustain her for 8 days. She said she will just take them as she really needs and make it last until 05/08/12.  At this time there does not appear to be any evidence of an acute emergency medical condition and the patient appears stable for discharge with appropriate outpatient follow up.Diagnosis was discussed with patient who verbalizes understanding and is agreeable to discharge.   Glade Nurse, PA-C 04/30/12 1036

## 2012-04-30 NOTE — ED Notes (Signed)
Pt states that she has a hx of anxiety attacks and has ran out of her medication on klonopin 0.5 mg and that she can not get her md to fill it due to he has moved and they can not see her at a new office. Pt asking for a refill until she is able to see an md.

## 2012-04-30 NOTE — ED Provider Notes (Signed)
Medical screening examination/treatment/procedure(s) were performed by non-physician practitioner and as supervising physician I was immediately available for consultation/collaboration.   Adiya Selmer M Arwa Yero, MD 04/30/12 1635 

## 2012-05-05 ENCOUNTER — Emergency Department (HOSPITAL_COMMUNITY)
Admission: EM | Admit: 2012-05-05 | Discharge: 2012-05-05 | Disposition: A | Discharge status: Home / Self Care | Payer: Medicaid Other | Attending: Emergency Medicine | Admitting: Emergency Medicine

## 2012-05-05 DIAGNOSIS — R51 Headache: Secondary | ICD-10-CM | POA: Insufficient documentation

## 2012-05-05 DIAGNOSIS — K029 Dental caries, unspecified: Secondary | ICD-10-CM | POA: Insufficient documentation

## 2012-05-05 DIAGNOSIS — F411 Generalized anxiety disorder: Secondary | ICD-10-CM | POA: Insufficient documentation

## 2012-05-05 DIAGNOSIS — Z79899 Other long term (current) drug therapy: Secondary | ICD-10-CM | POA: Insufficient documentation

## 2012-05-05 DIAGNOSIS — R110 Nausea: Secondary | ICD-10-CM | POA: Insufficient documentation

## 2012-05-05 DIAGNOSIS — K089 Disorder of teeth and supporting structures, unspecified: Secondary | ICD-10-CM | POA: Insufficient documentation

## 2012-05-05 DIAGNOSIS — Z8739 Personal history of other diseases of the musculoskeletal system and connective tissue: Secondary | ICD-10-CM | POA: Insufficient documentation

## 2012-05-05 DIAGNOSIS — Z8659 Personal history of other mental and behavioral disorders: Secondary | ICD-10-CM | POA: Insufficient documentation

## 2012-05-05 DIAGNOSIS — F17200 Nicotine dependence, unspecified, uncomplicated: Secondary | ICD-10-CM | POA: Insufficient documentation

## 2012-05-05 MED ORDER — HYDROCODONE-ACETAMINOPHEN 5-500 MG PO TABS: 1.0000 | ORAL_TABLET | Freq: Four times a day (QID) | ORAL | Status: DC | PRN

## 2012-05-05 MED ORDER — BUPIVACAINE-EPINEPHRINE PF 0.5-1:200000 % IJ SOLN
1.8000 mL | Freq: Once | INTRAMUSCULAR | Status: DC
  Filled 2012-05-05: qty 1.8

## 2012-05-05 MED ORDER — PENICILLIN V POTASSIUM 500 MG PO TABS: 500.0000 mg | ORAL_TABLET | Freq: Four times a day (QID) | ORAL | Status: DC

## 2012-05-05 NOTE — ED Notes (Signed)
Pt ambulatory to exam room with steady gait. Pt drove self here.

## 2012-05-05 NOTE — ED Provider Notes (Signed)
History    This chart was scribed for non-physician practitioner working with Gerhard Munch, MD by Sofie Rower, ED Scribe. This patient was seen in room WTR7/WTR7 and the patient's care was started at 6:33PM.   CSN: [id number]  Arrival date & time 05/05/12  1555   First MD Initiated Contact with Patient 05/05/12 1833       No chief complaint on file.   (Consider location/radiation/quality/duration/timing/severity/associated sxs/prior treatment) The history is provided by the patient. No language interpreter was used.    Terri White is a 47 y.o. female , with a hx of arthritis, back pain, and anxiety, who presents to the Emergency Department with a chief complaint of dental pain, complaining of sudden, progressively worsening, dental pain located at the left upper jaw, radiating upwards towards the left side of the head onset today (05/05/12).  Associated symptoms include nausea and headache. The pt reports she is experiencing a throbbing dental pain from a broken tooth located at her left upper jaw. The pt informs she has taken OTC motrin, however, it does not provide relief of the dental pain.  It has been seen 15 times in the last 6 months for similar complaints.  The pt denies fever, chills, and vomiting. Furthermore, the pt denies any major medical problems at this time.   The pt is a current some day smoker (0.5 packs/day), and drinks alcohol socially.   Pt does have a Dentist, however, she was not able to scheduled and evaluated until Friday (05/08/12)   Past Medical History  Diagnosis Date  . Arthritis   . Back pain   . Anxiety     Past Surgical History  Procedure Laterality Date  . Cesarean section    . Knee surgery    . Knee surgery      R knee    No family history on file.  History  Substance Use Topics  . Smoking status: Current Some Day Smoker -- 0.50 packs/day for 10 years    Types: Cigarettes  . Smokeless tobacco: Never Used  . Alcohol Use: No     Comment:  socially    OB History   Grav Para Term Preterm Abortions TAB SAB Ect Mult Living                  Review of Systems  Constitutional: Negative for fever, diaphoresis, appetite change, fatigue and unexpected weight change.  HENT: Positive for dental problem. Negative for mouth sores and neck stiffness.   Eyes: Negative for visual disturbance.  Respiratory: Negative for cough, chest tightness, shortness of breath and wheezing.   Cardiovascular: Negative for chest pain.  Gastrointestinal: Negative for nausea, vomiting, abdominal pain, diarrhea and constipation.  Endocrine: Negative for polydipsia, polyphagia and polyuria.  Genitourinary: Negative for dysuria, urgency, frequency and hematuria.  Musculoskeletal: Negative for back pain.  Skin: Negative for rash.  Allergic/Immunologic: Negative for immunocompromised state.  Neurological: Negative for syncope, light-headedness and headaches.  Hematological: Does not bruise/bleed easily.  Psychiatric/Behavioral: Negative for sleep disturbance. The patient is not nervous/anxious.   All other systems reviewed and are negative.    Allergies  Diclofenac sodium; Pyridium; Sulfa antibiotics; and Tramadol  Home Medications   Current Outpatient Rx  Name  Route  Sig  Dispense  Refill  . Aspirin-Acetaminophen-Caffeine (GOODY HEADACHE PO)   Oral   Take 2 packets by mouth once.         . clonazePAM (KLONOPIN) 0.5 MG tablet   Oral   Take 0.5  mg by mouth 3 (three) times daily as needed. For anxiety         . ibuprofen (ADVIL,MOTRIN) 200 MG tablet   Oral   Take 400 mg by mouth every 8 (eight) hours as needed. For pain.         [name] HYDROcodone-acetaminophen (VICODIN) 5-500 MG per tablet   Oral   Take 1 tablet by mouth every 6 (six) hours as needed for pain.   6 tablet   0   . penicillin v potassium (VEETID) 500 MG tablet   Oral   Take 1 tablet (500 mg total) by mouth 4 (four) times daily.   40 tablet   0     BP 117/77  Pulse  97  Temp(Src) 98.4 F (36.9 C) (Oral)  SpO2 100%  Physical Exam  Nursing note and vitals reviewed. Constitutional: She appears well-developed and well-nourished.  HENT:  Head: Normocephalic and atraumatic.  Right Ear: Tympanic membrane, external ear and ear canal normal.  Left Ear: Tympanic membrane, external ear and ear canal normal.  Nose: Nose normal. Right sinus exhibits no maxillary sinus tenderness and no frontal sinus tenderness. Left sinus exhibits no maxillary sinus tenderness and no frontal sinus tenderness.  Mouth/Throat: Uvula is midline, oropharynx is clear and moist and mucous membranes are normal. Mucous membranes are not dry and not cyanotic. No oral lesions. Abnormal dentition. Dental caries present. No dental abscesses, edematous or lacerations. No oropharyngeal exudate, posterior oropharyngeal edema, posterior oropharyngeal erythema or tonsillar abscesses.    Tooth Broken, dental caries throughout. No erythema, no evidence of gross abscess.   Eyes: Conjunctivae are normal. Pupils are equal, round, and reactive to light. Right eye exhibits no discharge. Left eye exhibits no discharge. No scleral icterus.  Neck: Normal range of motion. Neck supple.  Cardiovascular: Normal rate, regular rhythm, normal heart sounds and intact distal pulses.   Pulmonary/Chest: Effort normal and breath sounds normal. No respiratory distress. She has no wheezes.  Abdominal: Soft. Bowel sounds are normal. She exhibits no distension and no mass. There is no tenderness. There is no rebound and no guarding.  Musculoskeletal: Normal range of motion. She exhibits no edema.  Lymphadenopathy:    She has no cervical adenopathy.  Neurological: She is alert.  Speech is clear and goal oriented Moves extremities without ataxia  Skin: Skin is warm and dry.  Psychiatric: She has a normal mood and affect.    ED Course  Dental Date/Time: 05/05/2012 7:04 PM Performed by: Dierdre Forth Authorized  by: Dierdre Forth Consent: Verbal consent obtained. Risks and benefits: risks, benefits and alternatives were discussed Consent given by: patient Patient understanding: patient states understanding of the procedure being performed Patient consent: the patient's understanding of the procedure matches consent given Procedure consent: procedure consent matches procedure scheduled Relevant documents: relevant documents present and verified Site marked: the operative site was marked Required items: required blood products, implants, devices, and special equipment available Patient identity confirmed: verbally with patient Time out: Immediately prior to procedure a "time out" was called to verify the correct patient, procedure, equipment, support staff and site/side marked as required. Preparation: Patient was prepped and draped in the usual sterile fashion. Local anesthesia used: yes Anesthesia: local infiltration Local anesthetic: bupivacaine 0.5% with epinephrine Anesthetic total: 0.5 ml Patient sedated: no Patient tolerance: Patient tolerated the procedure well with no immediate complications. Comments: Dental block of tooth #12 without complication   (including critical care time)  DIAGNOSTIC STUDIES: Oxygen Saturation is 100% on room  air, normal by my interpretation.    COORDINATION OF CARE:  6:56 PM- Treatment plan concerning performing dental block discussed with patient. Pt agrees with treatment.  7:03 PM- Recheck. Dental block performed.  Treatment plan discussed with patient. Pt agrees with treatment.         Labs Reviewed - No data to display No results found.   1. Dental caries   2. Pain, dental       MDM  Makaila Windle presents for dental pain. Patient has been seen 15 times in the last 6 months for similar complaints.  Concern for drug-seeking behavior.  I have stressed to her that she must followup with her dentist and the emergency department will no  longer be able to write her pain medication.  Patient with toothache.  No gross abscess.  Exam unconcerning for Ludwig's angina or spread of infection.  Will treat with penicillin and pain medicine.  Urged patient to follow-up with dentist.    1. Medications:  penicillin, usual home medications 2. Treatment: rest, drink plenty of fluids, take medication as prescribed 3. Follow Up: Please followup with your primary doctor for discussion of your diagnoses and further evaluation after today's visit; if you do not have a primary care doctor use the resource guide provided to find one; follow-up with the dentist  I personally performed the services described in this documentation, which was scribed in my presence. The recorded information has been reviewed and is accurate.   Dierdre Forth, PA-C 05/05/12 1907

## 2012-05-05 NOTE — ED Provider Notes (Signed)
  Medical screening examination/treatment/procedure(s) were performed by non-physician practitioner and as supervising physician I was immediately available for consultation/collaboration.    [place] Masaki, MD 05/05/12 2346 

## 2012-05-05 NOTE — ED Notes (Signed)
Pt sts that she started to have dental pain from a broken tooth. Pt called her primary dentist and they stated they can't see her until Friday and they advised her to be seen in the ED. No distress noted.

## 2012-05-10 ENCOUNTER — Encounter (HOSPITAL_COMMUNITY): Payer: Self-pay | Admitting: Emergency Medicine

## 2012-05-10 ENCOUNTER — Emergency Department (HOSPITAL_COMMUNITY)
Admission: EM | Admit: 2012-05-10 | Discharge: 2012-05-10 | Disposition: A | Discharge status: Home / Self Care | Payer: Medicaid Other | Attending: Emergency Medicine | Admitting: Emergency Medicine

## 2012-05-10 DIAGNOSIS — Z79899 Other long term (current) drug therapy: Secondary | ICD-10-CM | POA: Insufficient documentation

## 2012-05-10 DIAGNOSIS — R1310 Dysphagia, unspecified: Secondary | ICD-10-CM | POA: Insufficient documentation

## 2012-05-10 DIAGNOSIS — F411 Generalized anxiety disorder: Secondary | ICD-10-CM | POA: Insufficient documentation

## 2012-05-10 DIAGNOSIS — K0889 Other specified disorders of teeth and supporting structures: Secondary | ICD-10-CM

## 2012-05-10 DIAGNOSIS — K029 Dental caries, unspecified: Secondary | ICD-10-CM | POA: Insufficient documentation

## 2012-05-10 DIAGNOSIS — K089 Disorder of teeth and supporting structures, unspecified: Secondary | ICD-10-CM | POA: Insufficient documentation

## 2012-05-10 DIAGNOSIS — R220 Localized swelling, mass and lump, head: Secondary | ICD-10-CM | POA: Insufficient documentation

## 2012-05-10 DIAGNOSIS — F17200 Nicotine dependence, unspecified, uncomplicated: Secondary | ICD-10-CM | POA: Insufficient documentation

## 2012-05-10 DIAGNOSIS — Z8739 Personal history of other diseases of the musculoskeletal system and connective tissue: Secondary | ICD-10-CM | POA: Insufficient documentation

## 2012-05-10 DIAGNOSIS — H9209 Otalgia, unspecified ear: Secondary | ICD-10-CM | POA: Insufficient documentation

## 2012-05-10 DIAGNOSIS — Z792 Long term (current) use of antibiotics: Secondary | ICD-10-CM | POA: Insufficient documentation

## 2012-05-10 MED ORDER — CLINDAMYCIN HCL 150 MG PO CAPS: 300.0000 mg | ORAL_CAPSULE | Freq: Four times a day (QID) | ORAL | Status: DC

## 2012-05-10 NOTE — ED Notes (Signed)
Pt presents w/ left upper dental pain, has been treated here for same on several occasions. Pt states she is now having ear pain, and bright yellow nasal drainage.

## 2012-05-10 NOTE — ED Provider Notes (Signed)
History     CSN: [id number]  Arrival date & time 05/10/12  0727   First MD Initiated Contact with Patient 05/10/12 [phone]      Chief Complaint  Patient presents with  . Dental Pain    (Consider location/radiation/quality/duration/timing/severity/associated sxs/prior treatment) HPI Comments: Patient presents with complaint of left maxillary tooth ache in area of the broken tooth. Patient states she was seen in emergency department last week and treated for a dental infection and had a dental block performed. She states that she still has the taste of pus in her mouth. She complains of radiation of pain to her left ear. She states that she was unable to be seen by her dentist in the past week. She denies neck swelling, trouble breathing, trouble swallowing. No fevers. Onset of symptoms gradual. Course is constant. Nothing makes symptoms better or worse.  Patient is a 47 y.o. female presenting with tooth pain. The history is provided by the patient.  Dental PainPrimary symptoms do not include headaches, fever, shortness of breath or sore throat.  Additional symptoms include: ear pain. Additional symptoms do not include: facial swelling and trouble swallowing.    Past Medical History  Diagnosis Date  . Arthritis   . Back pain   . Anxiety     Past Surgical History  Procedure Laterality Date  . Cesarean section    . Knee surgery    . Knee surgery      R knee    No family history on file.  History  Substance Use Topics  . Smoking status: Current Some Day Smoker -- 0.50 packs/day for 10 years    Types: Cigarettes  . Smokeless tobacco: Never Used  . Alcohol Use: No     Comment: socially    OB History   Grav Para Term Preterm Abortions TAB SAB Ect Mult Living                  Review of Systems  Constitutional: Negative for fever.  HENT: Positive for ear pain and dental problem. Negative for sore throat, facial swelling, trouble swallowing and neck pain.   Respiratory:  Negative for shortness of breath and stridor.   Skin: Negative for color change.  Neurological: Negative for headaches.    Allergies  Diclofenac sodium; Pyridium; Sulfa antibiotics; and Tramadol  Home Medications   Current Outpatient Rx  Name  Route  Sig  Dispense  Refill  . Aspirin-Acetaminophen-Caffeine (GOODY HEADACHE PO)   Oral   Take 2 packets by mouth every 6 (six) hours as needed (pain).          . clonazePAM (KLONOPIN) 0.5 MG tablet   Oral   Take 0.5 mg by mouth 3 (three) times daily as needed. For anxiety         . ibuprofen (ADVIL,MOTRIN) 200 MG tablet   Oral   Take 400 mg by mouth every 8 (eight) hours as needed. For pain.         [name] penicillin v potassium (VEETID) 500 MG tablet   Oral   Take 1 tablet (500 mg total) by mouth 4 (four) times daily.   40 tablet   0   . clindamycin (CLEOCIN) 150 MG capsule   Oral   Take 2 capsules (300 mg total) by mouth every 6 (six) hours.   56 capsule   0     BP 110/55  Pulse 90  Temp(Src) 98.5 F (36.9 C) (Oral)  Resp 16  SpO2 99%  LMP 04/16/2012  Physical Exam  Nursing note and vitals reviewed. Constitutional: She appears well-developed and well-nourished.  HENT:  Head: Normocephalic and atraumatic. No trismus in the jaw.  Right Ear: Tympanic membrane, external ear and ear canal normal.  Left Ear: Tympanic membrane, external ear and ear canal normal.  Nose: Nose normal. No mucosal edema or rhinorrhea. Right sinus exhibits no maxillary sinus tenderness and no frontal sinus tenderness. Left sinus exhibits no maxillary sinus tenderness and no frontal sinus tenderness.  Mouth/Throat: Uvula is midline, oropharynx is clear and moist and mucous membranes are normal. Abnormal dentition. Dental caries present. No dental abscesses or edematous. No tonsillar abscesses.  Patient with L maxillary tooth pain and tenderness to palpation in area of broken lateral incisor. No swelling or erythema noted on exam. No areas of  fluctuance palpated. No facial swelling.  Eyes: Conjunctivae are normal.  Neck: Normal range of motion. Neck supple.  No neck swelling or Ludwig's angina  Lymphadenopathy:    She has no cervical adenopathy.  Neurological: She is alert.  Skin: Skin is warm and dry.  Psychiatric: She has a normal mood and affect.    ED Course  Procedures (including critical care time)  Labs Reviewed - No data to display No results found.   1. Pain, dental    9:07 AM Patient seen and examined. Work-up initiated. Medications ordered.   Vital signs reviewed and are as follows: Filed Vitals:   05/10/12 0808  BP: 110/55  Pulse: 90  Temp: 98.5 F (36.9 C)  Resp: 16   Patient counseled to take prescribed medications as directed, return with worsening facial or neck swelling, and to follow-up with their dentist as soon as possible.     MDM  Patient with toothache.  No gross abscess.  Exam unconcerning for Ludwig's angina or other deep tissue infection in neck.  Her antibiotics switched to clindamycin and due to patient concern for infection and tasting pus. She states she will be reevaluated by her dentist tomorrow. Note, patient with multiple ED visits for pain-related complaints. Will not give additional narcotics today.        [place], [place] 05/10/12 [phone]

## 2012-05-11 NOTE — ED Provider Notes (Signed)
Medical screening examination/treatment/procedure(s) were performed by non-physician practitioner and as supervising physician I was immediately available for consultation/collaboration.   Hurman Horn, MD 05/11/12 [phone]

## 2012-05-24 ENCOUNTER — Emergency Department (HOSPITAL_COMMUNITY)
Admission: EM | Admit: 2012-05-24 | Discharge: 2012-05-24 | Disposition: A | Discharge status: Home / Self Care | Payer: Medicaid Other | Attending: Emergency Medicine | Admitting: Emergency Medicine

## 2012-05-24 ENCOUNTER — Encounter (HOSPITAL_COMMUNITY): Payer: Self-pay | Admitting: Emergency Medicine

## 2012-05-24 DIAGNOSIS — Z8739 Personal history of other diseases of the musculoskeletal system and connective tissue: Secondary | ICD-10-CM | POA: Insufficient documentation

## 2012-05-24 DIAGNOSIS — R197 Diarrhea, unspecified: Secondary | ICD-10-CM | POA: Insufficient documentation

## 2012-05-24 DIAGNOSIS — F411 Generalized anxiety disorder: Secondary | ICD-10-CM | POA: Insufficient documentation

## 2012-05-24 DIAGNOSIS — F17200 Nicotine dependence, unspecified, uncomplicated: Secondary | ICD-10-CM | POA: Insufficient documentation

## 2012-05-24 DIAGNOSIS — K529 Noninfective gastroenteritis and colitis, unspecified: Secondary | ICD-10-CM

## 2012-05-24 DIAGNOSIS — M129 Arthropathy, unspecified: Secondary | ICD-10-CM | POA: Insufficient documentation

## 2012-05-24 DIAGNOSIS — Z3202 Encounter for pregnancy test, result negative: Secondary | ICD-10-CM | POA: Insufficient documentation

## 2012-05-24 DIAGNOSIS — K5289 Other specified noninfective gastroenteritis and colitis: Secondary | ICD-10-CM | POA: Insufficient documentation

## 2012-05-24 DIAGNOSIS — R112 Nausea with vomiting, unspecified: Secondary | ICD-10-CM | POA: Insufficient documentation

## 2012-05-24 LAB — LIPASE, BLOOD: Lipase: 15 U/L (ref 11–59)

## 2012-05-24 LAB — POCT PREGNANCY, URINE: Preg Test, Ur: NEGATIVE

## 2012-05-24 LAB — CBC WITH DIFFERENTIAL/PLATELET
Basophils Relative: 0 % (ref 0–1)
Eosinophils Absolute: 0.1 10*3/uL (ref 0.0–0.7)
Hemoglobin: 13.9 g/dL (ref 12.0–15.0)
Lymphs Abs: 1.3 10*3/uL (ref 0.7–4.0)
MCHC: 34 g/dL (ref 30.0–36.0)
Monocytes Relative: 8 % (ref 3–12)
Neutro Abs: 3.2 10*3/uL (ref 1.7–7.7)
Neutrophils Relative %: 64 % (ref 43–77)
Platelets: 479 10*3/uL — ABNORMAL HIGH (ref 150–400)
RBC: 4.14 MIL/uL (ref 3.87–5.11)

## 2012-05-24 LAB — URINALYSIS, MICROSCOPIC ONLY
Bilirubin Urine: NEGATIVE
Glucose, UA: NEGATIVE mg/dL
Hgb urine dipstick: NEGATIVE
Ketones, ur: NEGATIVE mg/dL
Nitrite: NEGATIVE
Specific Gravity, Urine: 1.029 (ref 1.005–1.030)
pH: 8 (ref 5.0–8.0)

## 2012-05-24 LAB — COMPREHENSIVE METABOLIC PANEL
ALT: 16 U/L (ref 0–35)
AST: 22 U/L (ref 0–37)
Albumin: 3.7 g/dL (ref 3.5–5.2)
Alkaline Phosphatase: 54 U/L (ref 39–117)
BUN: 13 mg/dL (ref 6–23)
Chloride: 105 mEq/L (ref 96–112)
Potassium: 3.5 mEq/L (ref 3.5–5.1)
Sodium: 142 mEq/L (ref 135–145)
Total Bilirubin: 0.2 mg/dL — ABNORMAL LOW (ref 0.3–1.2)
Total Protein: 7.1 g/dL (ref 6.0–8.3)

## 2012-05-24 MED ORDER — ONDANSETRON HCL 4 MG PO TABS: 4.0000 mg | ORAL_TABLET | Freq: Four times a day (QID) | ORAL | Status: DC

## 2012-05-24 MED ORDER — SODIUM CHLORIDE 0.9 % IV BOLUS (SEPSIS)
1000.0000 mL | Freq: Once | INTRAVENOUS | Status: AC
  Administered 2012-05-24: 1000 mL via INTRAVENOUS

## 2012-05-24 MED ORDER — IBUPROFEN 800 MG PO TABS
800.0000 mg | ORAL_TABLET | Freq: Once | ORAL | Status: AC
  Administered 2012-05-24: 800 mg via ORAL
  Filled 2012-05-24: qty 1

## 2012-05-24 MED ORDER — OXYCODONE-ACETAMINOPHEN 5-325 MG PO TABS
2.0000 | ORAL_TABLET | Freq: Once | ORAL | Status: AC
  Administered 2012-05-24: 2 via ORAL
  Filled 2012-05-24: qty 2

## 2012-05-24 MED ORDER — SODIUM CHLORIDE 0.9 % IV SOLN
1000.0000 mL | Freq: Once | INTRAVENOUS | Status: AC
  Administered 2012-05-24: 1000 mL via INTRAVENOUS

## 2012-05-24 MED ORDER — ONDANSETRON HCL 4 MG/2ML IJ SOLN
4.0000 mg | Freq: Once | INTRAMUSCULAR | Status: AC
  Administered 2012-05-24: 4 mg via INTRAVENOUS
  Filled 2012-05-24: qty 2

## 2012-05-24 NOTE — ED Notes (Signed)
Pt presenting to ed with c/o abdominal pain with positive nausea, vomiting and diarrhea onset x 4:00am. Pt states her son is just getting over the stomach virus and she thinks she has it now

## 2012-05-24 NOTE — ED Provider Notes (Signed)
History     CSN: [id number]  Arrival date & time 05/24/12  1106   First MD Initiated Contact with Patient 05/24/12 1145      Chief Complaint  Patient presents with  . Abdominal Pain    (Consider location/radiation/quality/duration/timing/severity/associated sxs/prior treatment) HPI Comments: Patient complains of nausea, vomiting and diarrhea that onset at 4 AM. She's vomited about 4 times since it is nonbloody nonbilious. She denies any blood in her stool. She states her son recently had a similar GI illness and thinks she has the same. Denies any fevers or chills. Denies any dysuria, hematuria, vaginal bleeding or discharge. She has diffuse crampy abdominal pain that is intermittent. She denies any recent antibiotic use.  The history is provided by the patient.    Past Medical History  Diagnosis Date  . Arthritis   . Back pain   . Anxiety     Past Surgical History  Procedure Laterality Date  . Cesarean section    . Knee surgery    . Knee surgery      R knee    No family history on file.  History  Substance Use Topics  . Smoking status: Current Some Day Smoker -- 0.50 packs/day for 10 years    Types: Cigarettes  . Smokeless tobacco: Never Used  . Alcohol Use: No     Comment: socially    OB History   Grav Para Term Preterm Abortions TAB SAB Ect Mult Living                  Review of Systems  Constitutional: Positive for activity change and appetite change. Negative for fever.  HENT: Negative for congestion and rhinorrhea.   Respiratory: Negative for cough and chest tightness.   Cardiovascular: Negative for chest pain.  Gastrointestinal: Positive for nausea, vomiting, abdominal pain and diarrhea.  Genitourinary: Negative for dysuria, vaginal bleeding and vaginal discharge.  Musculoskeletal: Negative for back pain.  Skin: Negative for rash.  Neurological: Negative for dizziness, weakness and headaches.  A complete 10 system review of systems was obtained and  all systems are negative except as noted in the HPI and PMH.    Allergies  Diclofenac sodium; Pyridium; Sulfa antibiotics; and Tramadol  Home Medications   Current Outpatient Rx  Name  Route  Sig  Dispense  Refill  . clonazePAM (KLONOPIN) 0.5 MG tablet   Oral   Take 0.5 mg by mouth 3 (three) times daily as needed. For anxiety         . ibuprofen (ADVIL,MOTRIN) 200 MG tablet   Oral   Take 400 mg by mouth every 8 (eight) hours as needed. For pain.         . clindamycin (CLEOCIN) 150 MG capsule   Oral   Take 2 capsules (300 mg total) by mouth every 6 (six) hours.   56 capsule   0   . ondansetron (ZOFRAN) 4 MG tablet   Oral   Take 1 tablet (4 mg total) by mouth every 6 (six) hours.   12 tablet   0     BP 116/63  Pulse 94  Temp(Src) 98.8 F (37.1 C) (Oral)  Resp 20  SpO2 99%  LMP 05/22/2012  Physical Exam  Constitutional: She is oriented to person, place, and time. She appears well-developed and well-nourished. No distress.  HENT:  Head: Normocephalic and atraumatic.  Mouth/Throat: Oropharynx is clear and moist. No oropharyngeal exudate.  Eyes: Conjunctivae and EOM are normal. Pupils are equal, round,  and reactive to light.  Neck: Normal range of motion. Neck supple.  Cardiovascular: Normal rate, regular rhythm and normal heart sounds.   No murmur heard. Pulmonary/Chest: Effort normal and breath sounds normal. No respiratory distress.  Abdominal: Soft. There is no tenderness. There is no rebound and no guarding.  soft  Musculoskeletal: Normal range of motion. She exhibits no edema and no tenderness.  Neurological: She is alert and oriented to person, place, and time. No cranial nerve deficit. She exhibits normal muscle tone. Coordination normal.  Skin: Skin is warm.    ED Course  Procedures (including critical care time)  Labs Reviewed  CBC WITH DIFFERENTIAL - Abnormal; Notable for the following:    Platelets 479 (*)    All other components within normal  limits  COMPREHENSIVE METABOLIC PANEL - Abnormal; Notable for the following:    Glucose, Bld 67 (*)    Total Bilirubin 0.2 (*)    All other components within normal limits  URINALYSIS, MICROSCOPIC ONLY - Abnormal; Notable for the following:    APPearance CLOUDY (*)    All other components within normal limits  LIPASE, BLOOD  POCT PREGNANCY, URINE   No results found.   1. Gastroenteritis       MDM  Nausea and vomiting, diarrhea since 4am. Recent sick contacts.  Abdomen is soft and nonsurgical. Patient appears well-hydrated in no distress.   No ketones in urine. Patient is tolerating by mouth in the ED. She's had no further episodes of vomiting or diarrhea. She appears nontoxic. Supportive care for viral gastroenteritis with by mouth hydration and antiemetics. Return precautions discussed.    Glynn Octave, MD 05/24/12 [phone]

## 2012-06-29 ENCOUNTER — Emergency Department (HOSPITAL_COMMUNITY)
Admission: EM | Admit: 2012-06-29 | Discharge: 2012-06-29 | Discharge status: Against Medical Advice | Payer: Medicaid Other

## 2012-06-29 ENCOUNTER — Encounter (HOSPITAL_COMMUNITY): Payer: Self-pay | Admitting: Emergency Medicine

## 2012-06-29 NOTE — ED Notes (Signed)
Pt here because doctor put wrong date on her prescription for Vyvanse and is living to go out of town tomorrow.

## 2012-06-30 ENCOUNTER — Emergency Department (INDEPENDENT_AMBULATORY_CARE_PROVIDER_SITE_OTHER)
Admission: EM | Admit: 2012-06-30 | Discharge: 2012-06-30 | Discharge status: Against Medical Advice | Payer: Self-pay | Source: Home / Self Care | Attending: Family Medicine | Admitting: Family Medicine

## 2012-06-30 ENCOUNTER — Encounter (HOSPITAL_COMMUNITY): Payer: Self-pay | Admitting: *Deleted

## 2012-06-30 DIAGNOSIS — F39 Unspecified mood [affective] disorder: Secondary | ICD-10-CM

## 2012-06-30 DIAGNOSIS — Z760 Encounter for issue of repeat prescription: Secondary | ICD-10-CM

## 2012-06-30 NOTE — ED Provider Notes (Signed)
History     CSN: [id number]  Arrival date & time 06/30/12  1726   First MD Initiated Contact with Patient 06/30/12 1813      Chief Complaint  Patient presents with  . Medication Refill    (Consider location/radiation/quality/duration/timing/severity/associated sxs/prior treatment) HPI Comments: 47 year old smoker female with history of mood disorder. Here requesting refill on Adderall 30 mg. Patient reports that she has a psychiatrist that is out of town until May 4. She states that she called the emergency department earlier and was told that we could give her refills on her medications. (In our records there is a phone contact yesterday where patient stated her psychiatrist has put the wrong date on her prescriptions for amphetamines and needed a prescription from an emergency department physician). Patient states that she has been on this medication for 2 years although has not had this medication listed in prior visits in our records. Patient does not have a prescription with an incorrect date as she stated yesterday, does not have an empty bottle with her name, medication and dose either. When I asked to her pharmacy number to confirm her medication dose she got upset and rude. Stating " I was told by Dr. Effie Shy that I would get a prescription for medication and you're not helping me" I asked patient if she has called her psychiatrist office and she said she was "asked to go to the emergency department for her medication refills on told her psychiatrist is back from vacation".   Past Medical History  Diagnosis Date  . Arthritis   . Back pain   . Anxiety     Past Surgical History  Procedure Laterality Date  . Cesarean section    . Knee surgery    . Knee surgery      R knee  . Cesarean section  2002 and 2003    Family History  Problem Relation Age of Onset  . Osteoarthritis Mother   . Rheum arthritis Father     History  Substance Use Topics  . Smoking status: Current Some  Day Smoker -- 0.30 packs/day for 10 years    Types: Cigarettes  . Smokeless tobacco: Never Used  . Alcohol Use: No    OB History   Grav Para Term Preterm Abortions TAB SAB Ect Mult Living                  Review of Systems  Constitutional: Negative for activity change and unexpected weight change.  Endocrine: Negative for cold intolerance and heat intolerance.  Psychiatric/Behavioral: Negative for agitation. The patient is not nervous/anxious.   All other systems reviewed and are negative.    Allergies  Diclofenac sodium; Pyridium; Sulfa antibiotics; and Tramadol  Home Medications   Current Outpatient Rx  Name  Route  Sig  Dispense  Refill  . Aspirin-Salicylamide-Caffeine (BC HEADACHE POWDER PO)   Oral   Take by mouth.         . clonazePAM (KLONOPIN) 0.5 MG tablet   Oral   Take 0.5 mg by mouth 3 (three) times daily as needed. For anxiety         . ibuprofen (ADVIL,MOTRIN) 200 MG tablet   Oral   Take 400 mg by mouth every 8 (eight) hours as needed. For pain.           BP 115/64  Pulse 94  Temp(Src) 98.3 F (36.8 C) (Oral)  Resp 16  SpO2 100%  LMP 06/20/2012  Physical Exam  Constitutional:  She is oriented to person, place, and time. She appears well-developed and well-nourished. No distress.  Neurological: She is alert and oriented to person, place, and time.  Skin: She is not diaphoretic.  Psychiatric: Her speech is normal. Judgment and thought content normal. Her affect is angry. She is aggressive. Cognition and memory are normal.  Patient states "give me that" and agresively grabbed her benzodiazepine prescription bottle from my hands prior leaving the room. (She had given the bottle to me previously to see the name of her psychiatrist)    ED Course  Procedures (including critical care time)  Labs Reviewed - No data to display No results found.   1. Encounter for medication refill       MDM  I offered patient to call her psychiatrist office  on regular hours tomorrow to confirm her medication name and dose and she could come tomorrow to pick up a prescription for 12 pills to cover the days until her provider comes back on May 4. Patient was upset stating "why did I have to wait if you are not going to help me" got up from examining table and left the room.        Sharin Grave, MD 07/02/12 [phone]

## 2012-06-30 NOTE — ED Notes (Signed)
She called and talked to Alinda Money the charge nurse yesterday and was told that Dr. Effie Shy said that we would refill her Adderall.  States her PCP Dr. Priscille Kluver is in [place] until 5/4.  Her next appt. Is 5/7.

## 2012-06-30 NOTE — ED Notes (Signed)
2010 Pt. not in room.  Dr. Alfonse Ras said she was going to the waiting room to talk to her father -in -law.  She said she was upset because the doctor told her she did not feel comfortable prescribing that medication without talking to her doctors office. Pt.did not have an empty bottle or any verification that she was on it. 2020 Pt. not in waiting room. Darin Engels did not see pt. leave.

## 2012-07-30 ENCOUNTER — Emergency Department (HOSPITAL_COMMUNITY): Payer: Medicaid Other

## 2012-07-30 ENCOUNTER — Emergency Department (HOSPITAL_COMMUNITY)
Admission: EM | Admit: 2012-07-30 | Discharge: 2012-07-30 | Disposition: A | Discharge status: Home / Self Care | Payer: Medicaid Other | Attending: Emergency Medicine | Admitting: Emergency Medicine

## 2012-07-30 ENCOUNTER — Encounter (HOSPITAL_COMMUNITY): Payer: Self-pay | Admitting: Nurse Practitioner

## 2012-07-30 DIAGNOSIS — F17200 Nicotine dependence, unspecified, uncomplicated: Secondary | ICD-10-CM | POA: Insufficient documentation

## 2012-07-30 DIAGNOSIS — S79929A Unspecified injury of unspecified thigh, initial encounter: Secondary | ICD-10-CM | POA: Insufficient documentation

## 2012-07-30 DIAGNOSIS — Y9289 Other specified places as the place of occurrence of the external cause: Secondary | ICD-10-CM | POA: Insufficient documentation

## 2012-07-30 DIAGNOSIS — M25551 Pain in right hip: Secondary | ICD-10-CM

## 2012-07-30 DIAGNOSIS — M129 Arthropathy, unspecified: Secondary | ICD-10-CM | POA: Insufficient documentation

## 2012-07-30 DIAGNOSIS — W1809XA Striking against other object with subsequent fall, initial encounter: Secondary | ICD-10-CM | POA: Insufficient documentation

## 2012-07-30 DIAGNOSIS — M25561 Pain in right knee: Secondary | ICD-10-CM

## 2012-07-30 DIAGNOSIS — Y93H2 Activity, gardening and landscaping: Secondary | ICD-10-CM | POA: Insufficient documentation

## 2012-07-30 DIAGNOSIS — S99929A Unspecified injury of unspecified foot, initial encounter: Secondary | ICD-10-CM | POA: Insufficient documentation

## 2012-07-30 DIAGNOSIS — Z9889 Other specified postprocedural states: Secondary | ICD-10-CM | POA: Insufficient documentation

## 2012-07-30 DIAGNOSIS — S8990XA Unspecified injury of unspecified lower leg, initial encounter: Secondary | ICD-10-CM | POA: Insufficient documentation

## 2012-07-30 DIAGNOSIS — F411 Generalized anxiety disorder: Secondary | ICD-10-CM | POA: Insufficient documentation

## 2012-07-30 DIAGNOSIS — S79919A Unspecified injury of unspecified hip, initial encounter: Secondary | ICD-10-CM | POA: Insufficient documentation

## 2012-07-30 MED ORDER — ACETAMINOPHEN 500 MG PO TABS
1000.0000 mg | ORAL_TABLET | Freq: Once | ORAL | Status: AC
  Administered 2012-07-30: 1000 mg via ORAL
  Filled 2012-07-30: qty 2

## 2012-07-30 NOTE — ED Notes (Signed)
Reported BP to Dr. Jeraldine Loots- ok to discharge.

## 2012-07-30 NOTE — ED Notes (Signed)
Per pt:  Pt was planting flowers in garden and fell onto the right knee.  The pain has been increasing.  Pt has been taking Motrin.  Pt states that the pain has been keeping her up at night due to shooting pains.

## 2012-07-30 NOTE — ED Provider Notes (Signed)
History     CSN: [id number]  Arrival date & time 07/30/12  4098   First MD Initiated Contact with Patient 07/30/12 0845      No chief complaint on file.    HPI  Patient presents 4 days after a fall with pain in her right knee, right hip. She states that she was standing, working in a garden, when she fell forward, striking the anterior of her right knee. Since onset the pain has been persistent, sore, and both the anterior knee, anterior hip. The patient remains ambulatory, but the pain is worse with ambulation, and at night. Pain is minimally improved relieved with NSAIDs, rest. No distal dysesthesia or weakness. No other notable complaints, such as urinary changes, significant back, abdominal, other pain.   Past Medical History  Diagnosis Date  . Arthritis   . Back pain   . Anxiety     Past Surgical History  Procedure Laterality Date  . Cesarean section    . Knee surgery    . Knee surgery      R knee  . Cesarean section  2002 and 2003    Family History  Problem Relation Age of Onset  . Osteoarthritis Mother   . Rheum arthritis Father     History  Substance Use Topics  . Smoking status: Current Some Day Smoker -- 0.30 packs/day for 10 years    Types: Cigarettes  . Smokeless tobacco: Never Used  . Alcohol Use: No    OB History   Grav Para Term Preterm Abortions TAB SAB Ect Mult Living                  Review of Systems  All other systems reviewed and are negative.    Allergies  Diclofenac sodium; Pyridium; Sulfa antibiotics; and Tramadol  Home Medications   Current Outpatient Rx  Name  Route  Sig  Dispense  Refill  . Aspirin-Salicylamide-Caffeine (BC HEADACHE POWDER PO)   Oral   Take by mouth.         . clonazePAM (KLONOPIN) 0.5 MG tablet   Oral   Take 0.5 mg by mouth 3 (three) times daily as needed. For anxiety         . ibuprofen (ADVIL,MOTRIN) 200 MG tablet   Oral   Take 400 mg by mouth every 8 (eight) hours as needed. For  pain.           BP 94/60  Pulse 74  Temp(Src) 98.5 F (36.9 C) (Oral)  Resp 16  Wt 160 lb 11.5 oz (72.9 kg)  BMI 29.39 kg/m2  SpO2 97%  LMP 06/20/2012  Physical Exam  Nursing note and vitals reviewed. Constitutional: She is oriented to person, place, and time. She appears well-developed and well-nourished. No distress.  HENT:  Head: Normocephalic and atraumatic.  Eyes: Conjunctivae and EOM are normal.  Cardiovascular: Normal rate and regular rhythm.   Pulmonary/Chest: Effort normal and breath sounds normal. No stridor. No respiratory distress.  Abdominal: She exhibits no distension.  Musculoskeletal: She exhibits no edema.       Right hip: She exhibits tenderness and bony tenderness. She exhibits normal range of motion, normal strength, no swelling, no crepitus, no deformity and no laceration.       Left hip: Normal.       Right knee: She exhibits bony tenderness. She exhibits normal range of motion, no swelling, no effusion, no ecchymosis, no deformity, no laceration, no erythema, normal alignment, no LCL laxity, normal patellar  mobility, normal meniscus and no MCL laxity. Tenderness found. Patellar tendon tenderness noted. No medial joint line, no lateral joint line, no MCL and no LCL tenderness noted.       Right ankle: Normal.  Neurological: She is alert and oriented to person, place, and time. No cranial nerve deficit.  Skin: Skin is warm and dry.  Psychiatric: She has a normal mood and affect.    ED Course  Procedures (including critical care time)  Labs Reviewed - No data to display No results found.   No diagnosis found.  Review of x-rays demonstrates no acute fracture in the hip or knee.  Patient remains ambulatory. Discussed orthopedics followup, and the patient was discharged with analgesics, ice pack instructions, orthopedics followup.  MDM  Patient presents several days after a mechanical fall with pain in her right knee, right hip. On exam she is awake  and alert, with tenderness to palpation in the aforementioned areas, but neurologically intact. Patient's x-rays do not demonstrate acute fracture, and given her preserved ambulatory status, the absence of distress, she was discharged in stable condition to follow up with orthopedics as needed.     Gerhard Munch, MD 07/30/12 1000

## 2012-10-07 ENCOUNTER — Emergency Department (HOSPITAL_COMMUNITY)
Admission: EM | Admit: 2012-10-07 | Discharge: 2012-10-07 | Disposition: A | Discharge status: Home / Self Care | Payer: Medicaid Other | Attending: Emergency Medicine | Admitting: Emergency Medicine

## 2012-10-07 ENCOUNTER — Telehealth (HOSPITAL_COMMUNITY): Payer: Self-pay | Admitting: Emergency Medicine

## 2012-10-07 ENCOUNTER — Encounter (HOSPITAL_COMMUNITY): Payer: Self-pay

## 2012-10-07 DIAGNOSIS — S025XXA Fracture of tooth (traumatic), initial encounter for closed fracture: Secondary | ICD-10-CM | POA: Insufficient documentation

## 2012-10-07 DIAGNOSIS — X58XXXA Exposure to other specified factors, initial encounter: Secondary | ICD-10-CM | POA: Insufficient documentation

## 2012-10-07 DIAGNOSIS — Y929 Unspecified place or not applicable: Secondary | ICD-10-CM | POA: Insufficient documentation

## 2012-10-07 DIAGNOSIS — M129 Arthropathy, unspecified: Secondary | ICD-10-CM | POA: Insufficient documentation

## 2012-10-07 DIAGNOSIS — Z79899 Other long term (current) drug therapy: Secondary | ICD-10-CM | POA: Insufficient documentation

## 2012-10-07 DIAGNOSIS — F17200 Nicotine dependence, unspecified, uncomplicated: Secondary | ICD-10-CM | POA: Insufficient documentation

## 2012-10-07 DIAGNOSIS — F411 Generalized anxiety disorder: Secondary | ICD-10-CM | POA: Insufficient documentation

## 2012-10-07 DIAGNOSIS — Y9389 Activity, other specified: Secondary | ICD-10-CM | POA: Insufficient documentation

## 2012-10-07 MED ORDER — AMOXICILLIN 500 MG PO CAPS: 500.0000 mg | ORAL_CAPSULE | Freq: Three times a day (TID) | ORAL | Status: DC

## 2012-10-07 MED ORDER — OXYCODONE-ACETAMINOPHEN 5-325 MG PO TABS: ORAL_TABLET | ORAL | Status: DC

## 2012-10-07 MED ORDER — OXYCODONE-ACETAMINOPHEN 5-325 MG PO TABS
1.0000 | ORAL_TABLET | Freq: Once | ORAL | Status: AC
  Administered 2012-10-07: 1 via ORAL
  Filled 2012-10-07: qty 1

## 2012-10-07 NOTE — ED Provider Notes (Signed)
CSN: [id number]     Arrival date & time 10/07/12  0114 History     First MD Initiated Contact with Patient 10/07/12 0136     Chief Complaint  Patient presents with  . Dental Pain   (Consider location/radiation/quality/duration/timing/severity/associated sxs/prior Treatment) HPI Terri White is a 47 y.o. female complaining of 10 out of 10 pain, described as electric like, to left upper tooth after she broke it was eating a cracker last night. Patient been taking ibuprofen at home with no relief. Patient has a dentist, Dr. Warren Danes, however he will be out of town until Friday.   Past Medical History  Diagnosis Date  . Arthritis   . Back pain   . Anxiety    Past Surgical History  Procedure Laterality Date  . Cesarean section    . Knee surgery    . Knee surgery      R knee  . Cesarean section  2002 and 2003   Family History  Problem Relation Age of Onset  . Osteoarthritis Mother   . Rheum arthritis Father    History  Substance Use Topics  . Smoking status: Current Some Day Smoker -- 0.30 packs/day for 10 years    Types: Cigarettes  . Smokeless tobacco: Never Used  . Alcohol Use: No   OB History   Grav Para Term Preterm Abortions TAB SAB Ect Mult Living            2     Review of Systems  Constitutional: Negative for fever.  HENT: Positive for dental problem. Negative for ear pain, facial swelling, drooling, trouble swallowing, neck pain, neck stiffness and voice change.   Respiratory: Negative for shortness of breath.   Cardiovascular: Negative for chest pain.  Gastrointestinal: Negative for nausea, vomiting, abdominal pain and diarrhea.  All other systems reviewed and are negative.    Allergies  Diclofenac sodium; Pyridium; Sulfa antibiotics; and Tramadol  Home Medications   Current Outpatient Rx  Name  Route  Sig  Dispense  Refill  . Aspirin-Acetaminophen-Caffeine (GOODY HEADACHE PO)   Oral   Take 1 packet by mouth daily as needed (pain).         .  clonazePAM (KLONOPIN) 0.5 MG tablet   Oral   Take 0.5 mg by mouth 3 (three) times daily as needed. For anxiety         . ibuprofen (ADVIL,MOTRIN) 200 MG tablet   Oral   Take 400 mg by mouth every 8 (eight) hours as needed. For pain.         [name] lisdexamfetamine (VYVANSE) 50 MG capsule   Oral   Take 50 mg by mouth every morning.         [name] amoxicillin (AMOXIL) 500 MG capsule   Oral   Take 1 capsule (500 mg total) by mouth 3 (three) times daily.   30 capsule   0   . oxyCODONE-acetaminophen (PERCOCET/ROXICET) 5-325 MG per tablet      1 to 2 tabs PO q6hrs  PRN for pain   15 tablet   0    BP 117/44  Pulse 84  Temp(Src) 98.4 F (36.9 C) (Oral)  Resp 20  Ht 5\' 2"  (1.575 m)  Wt 138 lb (62.596 kg)  BMI 25.23 kg/m2  SpO2 100%  LMP 10/06/2012 Physical Exam  Nursing note and vitals reviewed. Constitutional: She is oriented to person, place, and time. She appears well-developed and well-nourished. No distress.  HENT:  Head: Normocephalic. No trismus in the  jaw.  Mouth/Throat:    Generally good dentition, no gingival swelling, erythema or tenderness to palpation. Patient is handling their secretions. There is no tenderness to palpation or firmness underneath tongue bilaterally. No trismus.    Eyes: Conjunctivae and EOM are normal.  Cardiovascular: Normal rate.   Pulmonary/Chest: Effort normal. No stridor.  Musculoskeletal: Normal range of motion.  Neurological: She is alert and oriented to person, place, and time.  Psychiatric: She has a normal mood and affect.    ED Course   Procedures (including critical care time)  Labs Reviewed - No data to display No results found. 1. Tooth fracture, closed, initial encounter     MDM   Filed Vitals:   10/07/12 0123  BP: 117/44  Pulse: 84  Temp: 98.4 F (36.9 C)  TempSrc: Oral  Resp: 20  Height: 5\' 2"  (1.575 m)  Weight: 138 lb (62.596 kg)  SpO2: 100%     Terri White is a 47 y.o. female Patient with toothache.  No  gross abscess.  Exam unconcerning for Ludwig's angina or spread of infection.  Will treat with penicillin and pain medicine.  Urged patient to follow-up with dentist, Dr. Warren Danes  Medications  oxyCODONE-acetaminophen (PERCOCET/ROXICET) 5-325 MG per tablet 1 tablet (not administered)    Pt is hemodynamically stable, appropriate for, and amenable to discharge at this time. Pt verbalized understanding and agrees with care plan. All questions answered. Outpatient follow-up and specific return precautions discussed.    New Prescriptions   AMOXICILLIN (AMOXIL) 500 MG CAPSULE    Take 1 capsule (500 mg total) by mouth 3 (three) times daily.   OXYCODONE-ACETAMINOPHEN (PERCOCET/ROXICET) 5-325 MG PER TABLET    1 to 2 tabs PO q6hrs  PRN for pain    Note: Portions of this report may have been transcribed using voice recognition software. Every effort was made to ensure accuracy; however, inadvertent computerized transcription errors may be present    Wynetta Emery, PA-C 10/07/12 0201

## 2012-10-07 NOTE — ED Notes (Signed)
Call from pharmacy regarding Percocet 5/325 Rx pt rcvd this am.  Pharmacy system showed  pt had recent Rx for Percocet 10/325 filled elsewhere.  Pt stated she [place] another [place] and this was to get her by till she could have it filled.  Call from pharmacy to verify this info.  Chart reviewed by current writer and no mention of pt having current Percocet Rx 10/325 in chart nor that she had left Rx in another state.  Pharmacist informed we didn't have any info on another Rx and he informed me pt had taken script back to get filled elsewhere.

## 2012-10-07 NOTE — ED Provider Notes (Signed)
Medical screening examination/treatment/procedure(s) were performed by non-physician practitioner and as supervising physician I was immediately available for consultation/collaboration.  Sunnie Nielsen, MD 10/07/12 [phone]

## 2012-10-07 NOTE — ED Notes (Signed)
Pt report that she broke her too on the the top left eating a cracker that provoked immediate pain. Pt has taking 1 motrin 800 with no relief. Pt reports that pain has gotten worse during the night.

## 2012-10-29 ENCOUNTER — Emergency Department (HOSPITAL_COMMUNITY): Payer: Medicaid Other

## 2012-10-29 ENCOUNTER — Emergency Department (HOSPITAL_COMMUNITY)
Admission: EM | Admit: 2012-10-29 | Discharge: 2012-10-29 | Disposition: A | Discharge status: Home / Self Care | Payer: Medicaid Other | Attending: Emergency Medicine | Admitting: Emergency Medicine

## 2012-10-29 ENCOUNTER — Encounter (HOSPITAL_COMMUNITY): Payer: Self-pay | Admitting: *Deleted

## 2012-10-29 DIAGNOSIS — Z8739 Personal history of other diseases of the musculoskeletal system and connective tissue: Secondary | ICD-10-CM | POA: Insufficient documentation

## 2012-10-29 DIAGNOSIS — Y9389 Activity, other specified: Secondary | ICD-10-CM | POA: Insufficient documentation

## 2012-10-29 DIAGNOSIS — S62001A Unspecified fracture of navicular [scaphoid] bone of right wrist, initial encounter for closed fracture: Secondary | ICD-10-CM

## 2012-10-29 DIAGNOSIS — S7000XA Contusion of unspecified hip, initial encounter: Secondary | ICD-10-CM | POA: Insufficient documentation

## 2012-10-29 DIAGNOSIS — F17200 Nicotine dependence, unspecified, uncomplicated: Secondary | ICD-10-CM | POA: Insufficient documentation

## 2012-10-29 DIAGNOSIS — L089 Local infection of the skin and subcutaneous tissue, unspecified: Secondary | ICD-10-CM | POA: Insufficient documentation

## 2012-10-29 DIAGNOSIS — Z79899 Other long term (current) drug therapy: Secondary | ICD-10-CM | POA: Insufficient documentation

## 2012-10-29 DIAGNOSIS — Y929 Unspecified place or not applicable: Secondary | ICD-10-CM | POA: Insufficient documentation

## 2012-10-29 DIAGNOSIS — S62009A Unspecified fracture of navicular [scaphoid] bone of unspecified wrist, initial encounter for closed fracture: Secondary | ICD-10-CM | POA: Insufficient documentation

## 2012-10-29 DIAGNOSIS — S79919A Unspecified injury of unspecified hip, initial encounter: Secondary | ICD-10-CM | POA: Insufficient documentation

## 2012-10-29 DIAGNOSIS — W11XXXA Fall on and from ladder, initial encounter: Secondary | ICD-10-CM | POA: Insufficient documentation

## 2012-10-29 DIAGNOSIS — F411 Generalized anxiety disorder: Secondary | ICD-10-CM | POA: Insufficient documentation

## 2012-10-29 DIAGNOSIS — S7001XA Contusion of right hip, initial encounter: Secondary | ICD-10-CM

## 2012-10-29 MED ORDER — KETOROLAC TROMETHAMINE 30 MG/ML IJ SOLN
30.0000 mg | Freq: Once | INTRAMUSCULAR | Status: AC
  Administered 2012-10-29: 30 mg via INTRAMUSCULAR
  Filled 2012-10-29: qty 1

## 2012-10-29 MED ORDER — IBUPROFEN 600 MG PO TABS: 600.0000 mg | ORAL_TABLET | Freq: Four times a day (QID) | ORAL | Status: DC | PRN

## 2012-10-29 MED ORDER — HYDROCODONE-ACETAMINOPHEN 5-325 MG PO TABS
1.0000 | ORAL_TABLET | Freq: Once | ORAL | Status: AC
  Administered 2012-10-29: 1 via ORAL
  Filled 2012-10-29: qty 1

## 2012-10-29 MED ORDER — HYDROCODONE-ACETAMINOPHEN 5-325 MG PO TABS: 1.0000 | ORAL_TABLET | ORAL | Status: DC | PRN

## 2012-10-29 NOTE — ED Notes (Signed)
Pt states that she fell off a step ladder last pm; pt c/o rt hip and rt wrist pain; pt able to ambulate but states "it hurts"; swelling and bruising noted to rt wrist area

## 2012-10-29 NOTE — ED Provider Notes (Signed)
CSN: [id number]     Arrival date & time 10/29/12  1478 History     First MD Initiated Contact with Patient 10/29/12 [phone]     Chief Complaint  Patient presents with  . Hip Pain  . Hand Pain   (Consider location/radiation/quality/duration/timing/severity/associated sxs/prior Treatment) HPI This is a 47 year old female with a history of anxiety and back pain who presents with right wrist and right hip pain. The patient states that she fell off a stepladder onto her right hip and hand last night at 10 PM. She denies any loss of consciousness. She did hit her head and sustained a small abrasion. Patient denies any other injury. She's been ambulatory. She denies any anticoagulant use.  Past Medical History  Diagnosis Date  . Arthritis   . Back pain   . Anxiety    Past Surgical History  Procedure Laterality Date  . Cesarean section    . Knee surgery    . Knee surgery      R knee  . Cesarean section  2002 and 2003   Family History  Problem Relation Age of Onset  . Osteoarthritis Mother   . Rheum arthritis Father    History  Substance Use Topics  . Smoking status: Current Some Day Smoker -- 0.30 packs/day for 10 years    Types: Cigarettes  . Smokeless tobacco: Never Used  . Alcohol Use: No   OB History   Grav Para Term Preterm Abortions TAB SAB Ect Mult Living            2     Review of Systems  Constitutional: Negative for fever.  Respiratory: Negative for shortness of breath.   Cardiovascular: Negative for chest pain.  Gastrointestinal: Negative for abdominal pain.  Musculoskeletal: Negative for back pain and gait problem.       Hip pain and wrist pain  Skin: Positive for wound.  Neurological: Negative for dizziness, syncope and headaches.  Psychiatric/Behavioral: Negative for confusion.  All other systems reviewed and are negative.    Allergies  Diclofenac sodium; Pyridium; Sulfa antibiotics; and Tramadol  Home Medications   Current Outpatient Rx  Name   Route  Sig  Dispense  Refill  . Aspirin-Acetaminophen-Caffeine (GOODY HEADACHE PO)   Oral   Take 1 packet by mouth daily as needed (pain).         . clonazePAM (KLONOPIN) 0.5 MG tablet   Oral   Take 0.5 mg by mouth 3 (three) times daily as needed. For anxiety         . ibuprofen (ADVIL,MOTRIN) 200 MG tablet   Oral   Take 400 mg by mouth every 8 (eight) hours as needed. For pain.         [name] lisdexamfetamine (VYVANSE) 50 MG capsule   Oral   Take 50 mg by mouth every morning.          BP 106/56  Pulse 84  Temp(Src) 98 F (36.7 C)  Resp 18  Ht 5\' 2"  (1.575 m)  Wt 139 lb (63.05 kg)  BMI 25.42 kg/m2  SpO2 97%  LMP 10/06/2012 Physical Exam  Nursing note and vitals reviewed. Constitutional: She is oriented to person, place, and time. She appears well-developed and well-nourished. No distress.  HENT:  Head: Normocephalic.  Small abrasion noted over the right parietal region  Eyes: Pupils are equal, round, and reactive to light.  Neck: Neck supple.  No midline neck tenderness  Cardiovascular: Normal rate, regular rhythm and normal heart sounds.  Pulmonary/Chest: Effort normal and breath sounds normal. No respiratory distress.  Abdominal: Soft. Bowel sounds are normal. There is no tenderness.  Musculoskeletal:  Tenderness to palpation over the right lateral hip over the greater trochanter. Full range of motion noted.  Favors that leg with ambulation. Limp noted with gait. Tenderness to palpation and swelling noted of the right wrist. There is snuff box tenderness.  Neurological: She is alert and oriented to person, place, and time.  Skin: Skin is warm and dry.  Psychiatric: She has a normal mood and affect.    ED Course   Procedures (including critical care time)  Labs Reviewed - No data to display Dg Wrist Complete Right  10/29/2012   *RADIOLOGY REPORT*  Clinical Data: Pain post trauma  RIGHT WRIST - COMPLETE 3+ VIEW  Comparison: None.  Findings:  Frontal, oblique,  lateral, and ulnar deviation scaphoid images were obtained. There are is no fracture or dislocation. Joint spaces appear intact.  No erosive change.  IMPRESSION:  No abnormality noted.   Original Report Authenticated By: Bretta Bang, M.D.   Dg Hip Complete Right  10/29/2012   *RADIOLOGY REPORT*  Clinical Data: Pain post trauma  RIGHT HIP - COMPLETE 2+ VIEW  Comparison: Jul 30, 2012  Findings: Frontal pelvis as well as frontal and lateral right hip images were obtained.  There is no fracture or dislocation.  Joint spaces appear intact.  No erosive change.  There is arthropathy in the lower lumbar spine, stable.  IMPRESSION: No fracture or dislocation.  Arthropathy in lower lumbar spine stable.  Hip joints appear intact.   Original Report Authenticated By: Bretta Bang, M.D.   Dg Femur Right  10/29/2012   *RADIOLOGY REPORT*  Clinical Data: Pain post trauma  RIGHT FEMUR - 2 VIEW  Comparison:  Right hip October 29, 2012  Findings: Frontal and lateral views were obtained.  No fracture or dislocation.  No abnormal periosteal reaction.  Joint spaces appear intact.  IMPRESSION: No abnormality noted.   Original Report Authenticated By: Bretta Bang, M.D.   1. Scaphoid fracture of wrist, right, closed, initial encounter   2. Contusion, hip, right, initial encounter     MDM  This is a 47 year old female who presents following a fall. She has tenderness to palpation over the greater trochanter of the right hip as well as swelling and tenderness to palpation of the right wrist including snuff box tenderness. She is otherwise nontoxic appearing and her vital signs are within normal limits. She's not currently on any anticoagulation. Plain films were obtained and reviewed. Patient has no evidence of fracture or dislocation. Patient's right wrist wall be splinted in a thumb spica for a presumed scaphoid fracture.  Patient will be given IM Toradol. She will be sent home with a short course of Norco for pain  management. Patient is to followup with orthopedic surgery in one week for repeat x-rays.  After history, exam, and medical workup I feel the patient has been appropriately medically screened and is safe for discharge home. Pertinent diagnoses were discussed with the patient. Patient was given return precautions.  Shon Baton, MD 10/29/12 [phone]

## 2012-10-29 NOTE — ED Notes (Signed)
Pt escorted to discharge window. Pt verbalized understanding discharge instructions. In no acute distress.  

## 2012-11-02 ENCOUNTER — Emergency Department (HOSPITAL_COMMUNITY)
Admission: EM | Admit: 2012-11-02 | Discharge: 2012-11-02 | Disposition: A | Discharge status: Home / Self Care | Payer: Medicaid Other | Attending: Emergency Medicine | Admitting: Emergency Medicine

## 2012-11-02 ENCOUNTER — Encounter (HOSPITAL_COMMUNITY): Payer: Self-pay | Admitting: Emergency Medicine

## 2012-11-02 DIAGNOSIS — Z79899 Other long term (current) drug therapy: Secondary | ICD-10-CM | POA: Insufficient documentation

## 2012-11-02 DIAGNOSIS — F411 Generalized anxiety disorder: Secondary | ICD-10-CM | POA: Insufficient documentation

## 2012-11-02 DIAGNOSIS — F17200 Nicotine dependence, unspecified, uncomplicated: Secondary | ICD-10-CM | POA: Insufficient documentation

## 2012-11-02 DIAGNOSIS — M25551 Pain in right hip: Secondary | ICD-10-CM

## 2012-11-02 DIAGNOSIS — W19XXXA Unspecified fall, initial encounter: Secondary | ICD-10-CM | POA: Insufficient documentation

## 2012-11-02 DIAGNOSIS — M129 Arthropathy, unspecified: Secondary | ICD-10-CM | POA: Insufficient documentation

## 2012-11-02 DIAGNOSIS — Z8739 Personal history of other diseases of the musculoskeletal system and connective tissue: Secondary | ICD-10-CM | POA: Insufficient documentation

## 2012-11-02 DIAGNOSIS — S79919A Unspecified injury of unspecified hip, initial encounter: Secondary | ICD-10-CM | POA: Insufficient documentation

## 2012-11-02 DIAGNOSIS — Y939 Activity, unspecified: Secondary | ICD-10-CM | POA: Insufficient documentation

## 2012-11-02 DIAGNOSIS — Y929 Unspecified place or not applicable: Secondary | ICD-10-CM | POA: Insufficient documentation

## 2012-11-02 DIAGNOSIS — R110 Nausea: Secondary | ICD-10-CM | POA: Insufficient documentation

## 2012-11-02 MED ORDER — KETOROLAC TROMETHAMINE 10 MG PO TABS: 10.0000 mg | ORAL_TABLET | Freq: Four times a day (QID) | ORAL | Status: DC | PRN

## 2012-11-02 MED ORDER — PREDNISONE 20 MG PO TABS: ORAL_TABLET | ORAL | Status: DC

## 2012-11-02 MED ORDER — ONDANSETRON 8 MG PO TBDP
8.0000 mg | ORAL_TABLET | Freq: Once | ORAL | Status: AC
  Administered 2012-11-02: 8 mg via ORAL
  Filled 2012-11-02: qty 1

## 2012-11-02 MED ORDER — HYDROCODONE-ACETAMINOPHEN 5-325 MG PO TABS
2.0000 | ORAL_TABLET | Freq: Once | ORAL | Status: AC
  Administered 2012-11-02: 2 via ORAL
  Filled 2012-11-02: qty 2

## 2012-11-02 NOTE — ED Notes (Signed)
Pt arrived to ED from home via POV with a complaint of hip pain.  t was seen here on 10/29/2012  For same.  POt has yet to schedule appointment for follow up.  Pt complains of hip pain that is a shooting, drilling pain that she feels in her bone.  Pt states it is radiating to her toes.

## 2012-11-02 NOTE — ED Notes (Signed)
Pt ambulatory to exam room with steady gait.  

## 2012-11-02 NOTE — ED Notes (Signed)
Pt has a ride home.  

## 2012-11-02 NOTE — ED Provider Notes (Signed)
CSN: [id number]     Arrival date & time 11/02/12  4098 History  This chart was scribed for Junious Silk, PA working with Dagmar Hait, MD by Quintella Reichert, ED Scribe. This patient was seen in room WTR5/WTR5 and the patient's care was started at 8:05 PM.     Chief Complaint  Patient presents with  . Hip Pain    The history is provided by the patient. No language interpreter was used.    HPI Comments: Terri White is a 47 y.o. female who presents to the Emergency Department complaining of hip pain subsequent to a fall that occurred 4 days ago.  Pt complains of intermittent pain "like an electric current" radiating from her right hip down her posterior upper leg.  Pain is exacerbated by walking.  Pt was seen here 4 days ago for the same and had been using pain medications as instructed, but her pain has continued to grow more severe..  She has not yet followed up with orthopedics.  She denies any new injuries.  She also complains of nausea.  She denies fever, chills, vomiting or any other associated symptoms.   Past Medical History  Diagnosis Date  . Arthritis   . Back pain   . Anxiety     Past Surgical History  Procedure Laterality Date  . Cesarean section    . Knee surgery    . Knee surgery      R knee  . Cesarean section  2002 and 2003    Family History  Problem Relation Age of Onset  . Osteoarthritis Mother   . Rheum arthritis Father     History  Substance Use Topics  . Smoking status: Current Some Day Smoker -- 0.30 packs/day for 10 years    Types: Cigarettes  . Smokeless tobacco: Never Used  . Alcohol Use: No    OB History   Grav Para Term Preterm Abortions TAB SAB Ect Mult Living            2       Review of Systems  Constitutional: Negative for fever and chills.  Gastrointestinal: Positive for nausea. Negative for vomiting.  Musculoskeletal: Positive for arthralgias.  All other systems reviewed and are negative.      Allergies   Diclofenac sodium; Pyridium; Sulfa antibiotics; and Tramadol  Home Medications   Current Outpatient Rx  Name  Route  Sig  Dispense  Refill  . acetaminophen (TYLENOL) 500 MG tablet   Oral   Take 1,000 mg by mouth every 6 (six) hours as needed for pain.         . Aspirin-Acetaminophen-Caffeine (GOODY HEADACHE PO)   Oral   Take 1 packet by mouth daily as needed (pain).         . clonazePAM (KLONOPIN) 0.5 MG tablet   Oral   Take 0.5 mg by mouth 3 (three) times daily as needed. For anxiety         . HYDROcodone-acetaminophen (NORCO/VICODIN) 5-325 MG per tablet   Oral   Take 1 tablet by mouth every 4 (four) hours as needed for pain.   10 tablet   0   . ibuprofen (ADVIL,MOTRIN) 200 MG tablet   Oral   Take 400 mg by mouth every 8 (eight) hours as needed. For pain.         [name] lisdexamfetamine (VYVANSE) 50 MG capsule   Oral   Take 50 mg by mouth every morning.  BP 116/60  Pulse 99  Temp(Src) 98.4 F (36.9 C) (Oral)  Resp 22  SpO2 100%  LMP 10/06/2012  Physical Exam  Nursing note and vitals reviewed. Constitutional: She is oriented to person, place, and time. She appears well-developed and well-nourished. No distress.  HENT:  Head: Normocephalic and atraumatic.  Right Ear: External ear normal.  Left Ear: External ear normal.  Nose: Nose normal.  Mouth/Throat: Oropharynx is clear and moist.  Eyes: Conjunctivae are normal.  Neck: Normal range of motion.  Cardiovascular: Normal rate, regular rhythm and normal heart sounds.   Pulmonary/Chest: Effort normal and breath sounds normal. No stridor. No respiratory distress. She has no wheezes. She has no rales.  Abdominal: Soft. She exhibits no distension.  Musculoskeletal: Normal range of motion. She exhibits tenderness.  Tender to palpation over sciatic notch.  Neurological: She is alert and oriented to person, place, and time. She has normal strength. Gait abnormal.  Antalgic gait. Neurovascularly intact.   Skin: Skin is warm and dry. She is not diaphoretic. No erythema.  Psychiatric: She has a normal mood and affect. Her behavior is normal.    ED Course  Procedures (including critical care time)  DIAGNOSTIC STUDIES: Oxygen Saturation is 100% on room air, normal by my interpretation.    COORDINATION OF CARE: 8:09 PM-Discussed treatment plan which includes pain medication and f/u with orthopedics with pt at bedside and pt agreed to plan.    Labs Review Labs Reviewed - No data to display  Imaging Review No results found.  MDM   1. Right hip pain    Patient with right hip pain from fall 4 days ago. She has already been evaluated for this pain and had negative xrays. She reports that she will call the orthopedist tomorrow. Neurovascularly intact. Compartment soft. Sent home with rx for toradol and prednisone. Return instructions given. Vital signs stable for discharge. Patient / Family / Caregiver informed of clinical course, understand medical decision-making process, and agree with plan.     I personally performed the services described in this documentation, which was scribed in my presence. The recorded information has been reviewed and is accurate.     Mora Bellman, PA-C 11/02/12 2034

## 2012-11-02 NOTE — ED Provider Notes (Signed)
Medical screening examination/treatment/procedure(s) were performed by non-physician practitioner and as supervising physician I was immediately available for consultation/collaboration.   Dagmar Hait, MD 11/02/12 2330

## 2012-11-05 ENCOUNTER — Encounter (HOSPITAL_COMMUNITY): Payer: Self-pay | Admitting: Psychiatry

## 2012-11-05 ENCOUNTER — Ambulatory Visit (INDEPENDENT_AMBULATORY_CARE_PROVIDER_SITE_OTHER): Payer: Federal, State, Local not specified - Other | Admitting: Psychiatry

## 2012-11-05 VITALS — BP 120/74 | HR 101 | Ht 62.0 in | Wt 140.0 lb

## 2012-11-05 DIAGNOSIS — F988 Other specified behavioral and emotional disorders with onset usually occurring in childhood and adolescence: Secondary | ICD-10-CM

## 2012-11-05 MED ORDER — LISDEXAMFETAMINE DIMESYLATE 50 MG PO CAPS: 50.0000 mg | ORAL_CAPSULE | Freq: Every morning | ORAL | Status: DC
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
## 2012-11-09 ENCOUNTER — Encounter (HOSPITAL_COMMUNITY): Payer: Self-pay | Admitting: Emergency Medicine

## 2012-11-09 ENCOUNTER — Emergency Department (HOSPITAL_COMMUNITY)
Admission: EM | Admit: 2012-11-09 | Discharge: 2012-11-09 | Disposition: A | Discharge status: Home / Self Care | Payer: Medicaid Other | Attending: Emergency Medicine | Admitting: Emergency Medicine

## 2012-11-09 DIAGNOSIS — K089 Disorder of teeth and supporting structures, unspecified: Secondary | ICD-10-CM | POA: Insufficient documentation

## 2012-11-09 DIAGNOSIS — Z79899 Other long term (current) drug therapy: Secondary | ICD-10-CM | POA: Insufficient documentation

## 2012-11-09 DIAGNOSIS — K0889 Other specified disorders of teeth and supporting structures: Secondary | ICD-10-CM

## 2012-11-09 DIAGNOSIS — F17200 Nicotine dependence, unspecified, uncomplicated: Secondary | ICD-10-CM | POA: Insufficient documentation

## 2012-11-09 DIAGNOSIS — F411 Generalized anxiety disorder: Secondary | ICD-10-CM | POA: Insufficient documentation

## 2012-11-09 DIAGNOSIS — M129 Arthropathy, unspecified: Secondary | ICD-10-CM | POA: Insufficient documentation

## 2012-11-09 MED ORDER — HYDROCODONE-ACETAMINOPHEN 5-325 MG PO TABS: 1.0000 | ORAL_TABLET | ORAL | Status: DC | PRN

## 2012-11-09 MED ORDER — PENICILLIN V POTASSIUM 500 MG PO TABS
500.0000 mg | ORAL_TABLET | Freq: Once | ORAL | Status: AC
  Administered 2012-11-09: 500 mg via ORAL
  Filled 2012-11-09: qty 1

## 2012-11-09 MED ORDER — HYDROCODONE-ACETAMINOPHEN 5-325 MG PO TABS
1.0000 | ORAL_TABLET | Freq: Once | ORAL | Status: AC
  Administered 2012-11-09: 1 via ORAL
  Filled 2012-11-09: qty 1

## 2012-11-09 MED ORDER — PENICILLIN V POTASSIUM 500 MG PO TABS: 500.0000 mg | ORAL_TABLET | Freq: Three times a day (TID) | ORAL | Status: DC

## 2012-11-09 NOTE — ED Provider Notes (Signed)
CSN: [id number]     Arrival date & time 11/09/12  1455 History  This chart was scribed for Elpidio Anis, PA working with Richardean Canal, MD by Quintella Reichert, ED Scribe. This patient was seen in room WTR8/WTR8 and the patient's care was started at 4:12 PM.    Chief Complaint  Patient presents with  . Dental Injury    The history is provided by the patient. No language interpreter was used.    HPI Comments: Terri White is a 47 y.o. female who presents to the Emergency Department complaining of a dental injury sustained 24 hours ago.  Pt states that she has had an abscess at a tooth in the left upper mouth for some time and yesterday afternoon she broke the tooth while eating.  Since then she has had constant moderate pain to that area.  She denies facial swelling, fever, vomiting or any other associated symptoms.   Past Medical History  Diagnosis Date  . Arthritis   . Back pain   . Anxiety     Past Surgical History  Procedure Laterality Date  . Cesarean section    . Knee surgery    . Knee surgery      R knee  . Cesarean section  2002 and 2003    Family History  Problem Relation Age of Onset  . Osteoarthritis Mother   . Rheum arthritis Father   . ADD / ADHD Sister     History  Substance Use Topics  . Smoking status: Current Some Day Smoker -- 0.30 packs/day for 10 years    Types: Cigarettes  . Smokeless tobacco: Never Used  . Alcohol Use: No    OB History   Grav Para Term Preterm Abortions TAB SAB Ect Mult Living            2       Review of Systems  Constitutional: Negative for fever.  HENT: Positive for dental problem. Negative for facial swelling.   Gastrointestinal: Negative for vomiting.  All other systems reviewed and are negative.      Allergies  Diclofenac sodium; Pyridium; Sulfa antibiotics; Toradol; and Tramadol  Home Medications   Current Outpatient Rx  Name  Route  Sig  Dispense  Refill  . acetaminophen (TYLENOL) 500 MG tablet   Oral   Take 1,000 mg by mouth every 6 (six) hours as needed for pain.         . Aspirin-Acetaminophen-Caffeine (GOODY HEADACHE PO)   Oral   Take 1 packet by mouth every 6 (six) hours as needed (For pain.).          [name] clonazePAM (KLONOPIN) 0.5 MG tablet   Oral   Take 0.5 mg by mouth 3 (three) times daily as needed. For anxiety         . ibuprofen (ADVIL,MOTRIN) 200 MG tablet   Oral   Take 400 mg by mouth every 8 (eight) hours as needed. For pain.         [name] lisdexamfetamine (VYVANSE) 50 MG capsule   Oral   Take 1 capsule (50 mg total) by mouth every morning.   30 capsule   0    BP 119/54  Pulse 88  Temp(Src) 98.5 F (36.9 C) (Oral)  Resp 18  Wt 138 lb (62.596 kg)  BMI 25.23 kg/m2  SpO2 100%  LMP 11/09/2012  Physical Exam  Nursing note and vitals reviewed. Constitutional: She is oriented to person, place, and time. She appears well-developed and  well-nourished. No distress.  HENT:  Head: Normocephalic and atraumatic.  Mouth/Throat: Uvula is midline, oropharynx is clear and moist and mucous membranes are normal. Dental caries present. No oropharyngeal exudate, posterior oropharyngeal edema or posterior oropharyngeal erythema.  Upper left 1st molar missing. 2nd molar with anterior caries. Alveolar tenderness without palpable abscess. No facial swelling.  Eyes: EOM are normal.  Neck: Neck supple. No tracheal deviation present.  Cardiovascular: Normal rate.   Pulmonary/Chest: Effort normal. No respiratory distress.  Musculoskeletal: Normal range of motion.  Lymphadenopathy:       Head (right side): No submental adenopathy present.       Head (left side): No submental adenopathy present.    She has no cervical adenopathy.  No submental or cervical adenopathy.  Neurological: She is alert and oriented to person, place, and time.  Skin: Skin is warm and dry.  Psychiatric: She has a normal mood and affect. Her behavior is normal.    ED Course  Procedures (including  critical care time)  DIAGNOSTIC STUDIES: Oxygen Saturation is 100% on room air, normal by my interpretation.    COORDINATION OF CARE: 4:15 PM: Discussed treatment plan which includes pain medication and antibiotics.  Pt expressed understanding and agreed to plan.   Labs Review Labs Reviewed - No data to display  Imaging Review No results found.  MDM  No diagnosis found. 1. Dental pain    I personally performed the services described in this documentation, which was scribed in my presence. The recorded information has been reviewed and is accurate.     Arnoldo Hooker, PA-C 11/09/12 1753

## 2012-11-09 NOTE — ED Provider Notes (Signed)
Medical screening examination/treatment/procedure(s) were performed by non-physician practitioner and as supervising physician I was immediately available for consultation/collaboration.   David H Yao, MD 11/09/12 2326 

## 2012-11-09 NOTE — ED Notes (Signed)
Pt reports that she broke her l/upper tooth 24 hrs ago

## 2012-11-23 ENCOUNTER — Encounter (HOSPITAL_COMMUNITY): Payer: Self-pay

## 2012-11-23 ENCOUNTER — Emergency Department (HOSPITAL_COMMUNITY)
Admission: EM | Admit: 2012-11-23 | Discharge: 2012-11-23 | Disposition: A | Discharge status: Home / Self Care | Payer: Medicaid Other | Attending: Emergency Medicine | Admitting: Emergency Medicine

## 2012-11-23 DIAGNOSIS — F17200 Nicotine dependence, unspecified, uncomplicated: Secondary | ICD-10-CM | POA: Insufficient documentation

## 2012-11-23 DIAGNOSIS — M129 Arthropathy, unspecified: Secondary | ICD-10-CM | POA: Insufficient documentation

## 2012-11-23 DIAGNOSIS — K089 Disorder of teeth and supporting structures, unspecified: Secondary | ICD-10-CM | POA: Insufficient documentation

## 2012-11-23 DIAGNOSIS — H9209 Otalgia, unspecified ear: Secondary | ICD-10-CM | POA: Insufficient documentation

## 2012-11-23 DIAGNOSIS — K0889 Other specified disorders of teeth and supporting structures: Secondary | ICD-10-CM

## 2012-11-23 DIAGNOSIS — F411 Generalized anxiety disorder: Secondary | ICD-10-CM | POA: Insufficient documentation

## 2012-11-23 DIAGNOSIS — R51 Headache: Secondary | ICD-10-CM | POA: Insufficient documentation

## 2012-11-23 DIAGNOSIS — R220 Localized swelling, mass and lump, head: Secondary | ICD-10-CM | POA: Insufficient documentation

## 2012-11-23 DIAGNOSIS — Z79899 Other long term (current) drug therapy: Secondary | ICD-10-CM | POA: Insufficient documentation

## 2012-11-23 MED ORDER — HYDROCODONE-ACETAMINOPHEN 5-325 MG PO TABS: 1.0000 | ORAL_TABLET | Freq: Four times a day (QID) | ORAL | Status: DC | PRN

## 2012-11-23 MED ORDER — HYDROCODONE-ACETAMINOPHEN 5-325 MG PO TABS
1.0000 | ORAL_TABLET | Freq: Once | ORAL | Status: AC
  Administered 2012-11-23: 1 via ORAL
  Filled 2012-11-23: qty 1

## 2012-11-23 NOTE — ED Notes (Signed)
Pt c/o L upper dental pain.  Pain score 9/10.  Pt was seen at [facility] on 9/1 for same complaint.  Pt sts "I don't think the penicillin is working, because there is puss coming out."

## 2012-11-23 NOTE — ED Provider Notes (Signed)
CSN: [id number]     Arrival date & time 11/23/12  1139 History   First MD Initiated Contact with Patient 11/23/12 1236     Chief Complaint  Patient presents with  . Dental Pain   (Consider location/radiation/quality/duration/timing/severity/associated sxs/prior Treatment) HPI Comments: Patient is a 47 year old female presented to the emergency department for continued left upper dental pain since being seen on September 1 for same complaints. Patient states her pain and facial swelling had initially been getting better until 2 days ago when she noted increased pain and returned purulent drainage from the tooth. Patient states she has tried to contact her dentist Dr. Warren Danes was reportedly told he would not see her until she finished antibiotic and was pain free. Patient states he has been taking her penicillin as prescribed and has 2 tablets left. Patient has not sharp radiating pain to the left side of face. She rates her pain 10/10 and states eating and drinking cold liquids aggravated her pain. She denies fevers or chills.  Patient is a 47 y.o. female presenting with tooth pain.  Dental Pain Associated symptoms: headaches   Associated symptoms: no drooling, no fever, no neck pain and no oral lesions     Past Medical History  Diagnosis Date  . Arthritis   . Back pain   . Anxiety    Past Surgical History  Procedure Laterality Date  . Cesarean section    . Knee surgery    . Knee surgery      R knee  . Cesarean section  2002 and 2003   Family History  Problem Relation Age of Onset  . Osteoarthritis Mother   . Rheum arthritis Father   . ADD / ADHD Sister    History  Substance Use Topics  . Smoking status: Current Some Day Smoker -- 0.30 packs/day for 10 years    Types: Cigarettes  . Smokeless tobacco: Never Used  . Alcohol Use: No   OB History   Grav Para Term Preterm Abortions TAB SAB Ect Mult Living            2     Review of Systems  Constitutional: Negative for  fever and chills.  HENT: Positive for ear pain, dental problem and sinus pressure. Negative for nosebleeds, sore throat, drooling, mouth sores, trouble swallowing, neck pain, neck stiffness and voice change.   Respiratory: Negative for shortness of breath.   Cardiovascular: Negative for chest pain.  Neurological: Positive for headaches.  All other systems reviewed and are negative.    Allergies  Diclofenac sodium; Pyridium; Sulfa antibiotics; Toradol; and Tramadol  Home Medications   Current Outpatient Rx  Name  Route  Sig  Dispense  Refill  . acetaminophen (TYLENOL) 500 MG tablet   Oral   Take 1,000 mg by mouth every 6 (six) hours as needed for pain.         . Aspirin-Acetaminophen-Caffeine (GOODY HEADACHE PO)   Oral   Take 1 packet by mouth every 6 (six) hours as needed (For pain.).          [name] benzocaine (ORAJEL) 10 % mucosal gel   Mouth/Throat   Use as directed 1 application in the mouth or throat as needed for pain.         . clonazePAM (KLONOPIN) 0.5 MG tablet   Oral   Take 0.5 mg by mouth 3 (three) times daily as needed. For anxiety         . HYDROcodone-acetaminophen (NORCO/VICODIN) 5-325 MG per  tablet   Oral   Take 1 tablet by mouth every 4 (four) hours as needed for pain.   10 tablet   0   . ibuprofen (ADVIL,MOTRIN) 200 MG tablet   Oral   Take 400 mg by mouth every 8 (eight) hours as needed. For pain.         [name] lisdexamfetamine (VYVANSE) 50 MG capsule   Oral   Take 1 capsule (50 mg total) by mouth every morning.   30 capsule   0   . penicillin v potassium (VEETID) 500 MG tablet   Oral   Take 1 tablet (500 mg total) by mouth 3 (three) times daily.   30 tablet   0   . Tetrahydrozoline-Zn Sulfate (EYE DROPS ALLERGY RELIEF OP)   Both Eyes   Place 1 drop into both eyes as needed (dry eyes).         [name] HYDROcodone-acetaminophen (NORCO/VICODIN) 5-325 MG per tablet   Oral   Take 1 tablet by mouth every 6 (six) hours as needed for pain.   6  tablet   0    BP 108/52  Pulse 82  Temp(Src) 97.9 F (36.6 C) (Oral)  Resp 14  SpO2 97%  LMP 11/09/2012 Physical Exam  Constitutional: She is oriented to person, place, and time. She appears well-developed and well-nourished. No distress.  HENT:  Head: Normocephalic and atraumatic.  Right Ear: Tympanic membrane and external ear normal.  Left Ear: Tympanic membrane and external ear normal.  Nose: Nose normal.  Mouth/Throat: Uvula is midline, oropharynx is clear and moist and mucous membranes are normal. She does not have dentures. No oral lesions. No trismus in the jaw. Abnormal dentition. Dental caries present. No dental abscesses, edematous or lacerations. No oropharyngeal exudate.  Eyes: Conjunctivae and EOM are normal. Pupils are equal, round, and reactive to light.  Neck: Normal range of motion. Neck supple.  Neurological: She is alert and oriented to person, place, and time.  Skin: Skin is warm and dry. She is not diaphoretic.  Psychiatric: She has a normal mood and affect.    ED Course  Procedures (including critical care time) Labs Review Labs Reviewed - No data to display Imaging Review No results found.  MDM   1. Pain, dental     Afebrile, NAD, non-toxic appearing, AAOx4. Patient with toothache.  No gross abscess.  Exam unconcerning for Ludwig's angina or spread of infection.  Advised to finish PCN course and pain medicine given.  Urged patient to follow-up with dentist either her own or one from dental list. Patient is agreeable to plan. Patient is stable at time of discharge        Jeannetta Ellis, PA-C 11/23/12 2036

## 2012-11-24 NOTE — ED Provider Notes (Signed)
Medical screening examination/treatment/procedure(s) were performed by non-physician practitioner and as supervising physician I was immediately available for consultation/collaboration.    [name] [name], MD 11/24/12 1850 

## 2012-12-01 ENCOUNTER — Ambulatory Visit (INDEPENDENT_AMBULATORY_CARE_PROVIDER_SITE_OTHER): Payer: Federal, State, Local not specified - Other | Admitting: Psychiatry

## 2012-12-01 ENCOUNTER — Encounter (HOSPITAL_COMMUNITY): Payer: Self-pay | Admitting: Psychiatry

## 2012-12-01 VITALS — BP 134/76 | HR 96 | Ht 62.0 in | Wt 145.4 lb

## 2012-12-01 DIAGNOSIS — F411 Generalized anxiety disorder: Secondary | ICD-10-CM

## 2012-12-01 DIAGNOSIS — F988 Other specified behavioral and emotional disorders with onset usually occurring in childhood and adolescence: Secondary | ICD-10-CM

## 2012-12-01 MED ORDER — LISDEXAMFETAMINE DIMESYLATE 50 MG PO CAPS: 50.0000 mg | ORAL_CAPSULE | Freq: Every morning | ORAL | Status: DC

## 2012-12-01 MED ORDER — CLONAZEPAM 0.5 MG PO TABS: 0.5000 mg | ORAL_TABLET | Freq: Three times a day (TID) | ORAL | Status: DC | PRN
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
## 2012-12-03 ENCOUNTER — Ambulatory Visit (HOSPITAL_COMMUNITY): Payer: Self-pay | Admitting: Psychiatry

## 2012-12-29 ENCOUNTER — Encounter (HOSPITAL_COMMUNITY): Payer: Self-pay | Admitting: Psychiatry

## 2012-12-29 ENCOUNTER — Encounter (INDEPENDENT_AMBULATORY_CARE_PROVIDER_SITE_OTHER): Payer: Self-pay

## 2012-12-29 ENCOUNTER — Ambulatory Visit (INDEPENDENT_AMBULATORY_CARE_PROVIDER_SITE_OTHER): Payer: Federal, State, Local not specified - Other | Admitting: Psychiatry

## 2012-12-29 VITALS — BP 100/70 | HR 80 | Wt 138.0 lb

## 2012-12-29 DIAGNOSIS — F411 Generalized anxiety disorder: Secondary | ICD-10-CM

## 2012-12-29 DIAGNOSIS — F988 Other specified behavioral and emotional disorders with onset usually occurring in childhood and adolescence: Secondary | ICD-10-CM

## 2012-12-29 MED ORDER — CLONAZEPAM 0.5 MG PO TABS: ORAL_TABLET | ORAL | Status: DC

## 2012-12-29 MED ORDER — LISDEXAMFETAMINE DIMESYLATE 50 MG PO CAPS: 50.0000 mg | ORAL_CAPSULE | Freq: Every morning | ORAL | Status: DC
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
## 2013-01-13 ENCOUNTER — Emergency Department (HOSPITAL_COMMUNITY)
Admission: EM | Admit: 2013-01-13 | Discharge: 2013-01-13 | Disposition: A | Discharge status: Home / Self Care | Payer: Medicaid Other | Attending: Emergency Medicine | Admitting: Emergency Medicine

## 2013-01-13 ENCOUNTER — Emergency Department (HOSPITAL_COMMUNITY): Payer: Medicaid Other

## 2013-01-13 ENCOUNTER — Encounter (HOSPITAL_COMMUNITY): Payer: Self-pay | Admitting: Emergency Medicine

## 2013-01-13 DIAGNOSIS — S93409A Sprain of unspecified ligament of unspecified ankle, initial encounter: Secondary | ICD-10-CM | POA: Insufficient documentation

## 2013-01-13 DIAGNOSIS — S93402A Sprain of unspecified ligament of left ankle, initial encounter: Secondary | ICD-10-CM

## 2013-01-13 DIAGNOSIS — Z79899 Other long term (current) drug therapy: Secondary | ICD-10-CM | POA: Insufficient documentation

## 2013-01-13 DIAGNOSIS — X500XXA Overexertion from strenuous movement or load, initial encounter: Secondary | ICD-10-CM | POA: Insufficient documentation

## 2013-01-13 DIAGNOSIS — F411 Generalized anxiety disorder: Secondary | ICD-10-CM | POA: Insufficient documentation

## 2013-01-13 DIAGNOSIS — Y92009 Unspecified place in unspecified non-institutional (private) residence as the place of occurrence of the external cause: Secondary | ICD-10-CM | POA: Insufficient documentation

## 2013-01-13 DIAGNOSIS — M129 Arthropathy, unspecified: Secondary | ICD-10-CM | POA: Insufficient documentation

## 2013-01-13 DIAGNOSIS — F17200 Nicotine dependence, unspecified, uncomplicated: Secondary | ICD-10-CM | POA: Insufficient documentation

## 2013-01-13 DIAGNOSIS — L989 Disorder of the skin and subcutaneous tissue, unspecified: Secondary | ICD-10-CM | POA: Insufficient documentation

## 2013-01-13 DIAGNOSIS — Y9389 Activity, other specified: Secondary | ICD-10-CM | POA: Insufficient documentation

## 2013-01-13 MED ORDER — OXYCODONE-ACETAMINOPHEN 5-325 MG PO TABS
1.0000 | ORAL_TABLET | Freq: Once | ORAL | Status: AC
  Administered 2013-01-13: 1 via ORAL
  Filled 2013-01-13: qty 1

## 2013-01-13 NOTE — ED Notes (Signed)
Pt informed that she must have a ride present in the ED before she is given any pain meds. Pt is agreeable.

## 2013-01-13 NOTE — ED Provider Notes (Signed)
CSN: [id number]     Arrival date & time 01/13/13  7829 History  This chart was scribed for non-physician practitioner Kyung Bacca, PA-C working with Derwood Kaplan, MD by Caryn Bee, ED Scribe. This patient was seen in room WTR6/WTR6 and the patient's care was started at 8:52 PM.    Chief Complaint  Patient presents with  . Ankle Injury   HPI HPI Comments: Terri White is a 47 y.o. female who presents to the Emergency Department complaining of sudden onset severe ankle discomfort that began upon standing up from couch around 6:25 AM.  Foot was asleep and when she tried to take a step she felt her ankle pop 3 or 4 times.  Able to bear weight but pain is worsened by movement and palpation. Pt took one ibuprofen around noon with no relief. She also took percocet with mild relief. Pt has tried using ice, but states that the ice causes her discomfort. She reports numbness, tingling, and paresthesias. Pt is worried that ibuprofen will not be sufficient. She states that her recently prescribed percocet is in [place] where she lives. Pt states she will get her father to send her the percocet.   Pt also complains of a chronic bump to the back of her knee that has been painful and draining pus since cutting herself shaving a few days ago.  Dr. Lonni Fix, her dermatologist that had evaluated it prior to this injury, had told her that it is probably nothing to worry about.  Past Medical History  Diagnosis Date  . Arthritis   . Back pain   . Anxiety    Past Surgical History  Procedure Laterality Date  . Cesarean section    . Knee surgery    . Knee surgery      R knee  . Cesarean section  2002 and 2003   Family History  Problem Relation Age of Onset  . Osteoarthritis Mother   . Rheum arthritis Father   . ADD / ADHD Sister    History  Substance Use Topics  . Smoking status: Current Some Day Smoker -- 0.30 packs/day for 10 years    Types: Cigarettes  . Smokeless tobacco: Never Used  .  Alcohol Use: No   OB History   Grav Para Term Preterm Abortions TAB SAB Ect Mult Living            2     Review of Systems  All other systems reviewed and are negative.    Allergies  Diclofenac sodium; Pyridium; Sulfa antibiotics; Toradol; and Tramadol  Home Medications   Current Outpatient Rx  Name  Route  Sig  Dispense  Refill  . clonazePAM (KLONOPIN) 0.5 MG tablet      Take twice daily and 3rd if needed for For anxiety   75 tablet   1   . lisdexamfetamine (VYVANSE) 50 MG capsule   Oral   Take 1 capsule (50 mg total) by mouth every morning.   30 capsule   0    Triage Vitals: BP 98/51  Pulse 98  Temp(Src) 98.4 F (36.9 C) (Oral)  Resp 16  SpO2 96%  LMP 12/30/2012  Physical Exam  Nursing note and vitals reviewed. Constitutional: She is oriented to person, place, and time. She appears well-developed and well-nourished.  Uncomfortable appearing.  Ambulatory.  HENT:  Head: Normocephalic and atraumatic.  Eyes:  Normal appearance  Neck: Normal range of motion.  Pulmonary/Chest: Effort normal.  Musculoskeletal: Normal range of motion.  L ankle  w/out deformity.  Edema at lateral malleolus.  Tenderness lateral/medial malleolus and dorsal surface of tarsal bones.  Pain w/ passive dorsiflexion and inversion/everion of foot.  2+ DP pulse and distal sensation intact.    Neurological: She is alert and oriented to person, place, and time.  Skin:  1cm vesicular lesion medial aspect distal L thigh.  Draining small amt of clear fluid.  No surrounding cellulitis.  Non-tender.   Psychiatric: She has a normal mood and affect. Her behavior is normal.    ED Course  Procedures (including critical care time) DIAGNOSTIC STUDIES: Oxygen Saturation is 96% on room air, normal by my interpretation.    COORDINATION OF CARE: 9:01 PM-Discussed treatment plan with pt at bedside and pt agreed to plan.   Labs Review Labs Reviewed - No data to display Imaging Review Dg Ankle Complete  Left  01/13/2013   CLINICAL DATA:  Injury, pain and swelling  EXAM: LEFT ANKLE COMPLETE - 3+ VIEW  COMPARISON:  None.  FINDINGS: Normal alignment without fracture or effusion. Diffuse soft tissue swelling more pronounced laterally. Distal tibia, fibula, talus and calcaneus appear intact.  IMPRESSION: Soft tissue swelling without acute osseous finding   Electronically Signed   By: Ruel Favors M.D.   On: 01/13/2013 20:20    EKG Interpretation   None       MDM   1. Sprain of left ankle, initial encounter    47yo healthy F presents w/ L ankle injury.  Ambulatory and no NV deficits on exam.  Xray shows soft tissue swelling, otherwise non-acute.  Ortho tech placed in ASO.  I recommended NSAID and RICE.  Pt requested narcotics but per [place] Controlled Substance Database, she was prescribed a 30d supply or percocet 01/06/13.  She does not deny this, but says she left it in [place].  Her story is suspicious and pt likely malingering.  Referred to ortho for persistent/worsening sx.  9:14 PM   I personally performed the services described in this documentation, which was scribed in my presence. The recorded information has been reviewed and is accurate.    Otilio Miu, PA-C 01/13/13 2115

## 2013-01-13 NOTE — ED Notes (Signed)
Pt states that when waking up her foot was asleep. Pt reports trying to move around foot but d/t to discomfort fell to ground. Pt states that she heard her foot pop and took one percocet.

## 2013-01-13 NOTE — ED Notes (Signed)
Pt's ride home has arrived. PA aware.

## 2013-01-16 NOTE — ED Provider Notes (Signed)
Medical screening examination/treatment/procedure(s) were performed by non-physician practitioner and as supervising physician I was immediately available for consultation/collaboration.  EKG Interpretation   None        Derwood Kaplan, MD 01/16/13 1719

## 2013-01-25 ENCOUNTER — Other Ambulatory Visit (HOSPITAL_COMMUNITY): Payer: Self-pay | Admitting: *Deleted

## 2013-01-25 DIAGNOSIS — F988 Other specified behavioral and emotional disorders with onset usually occurring in childhood and adolescence: Secondary | ICD-10-CM

## 2013-01-25 MED ORDER — LISDEXAMFETAMINE DIMESYLATE 50 MG PO CAPS: 50.0000 mg | ORAL_CAPSULE | Freq: Every morning | ORAL | Status: DC

## 2013-02-26 ENCOUNTER — Encounter (HOSPITAL_COMMUNITY): Payer: Self-pay | Admitting: Psychiatry

## 2013-02-26 ENCOUNTER — Ambulatory Visit (INDEPENDENT_AMBULATORY_CARE_PROVIDER_SITE_OTHER): Payer: Federal, State, Local not specified - Other | Admitting: Psychiatry

## 2013-02-26 ENCOUNTER — Encounter (INDEPENDENT_AMBULATORY_CARE_PROVIDER_SITE_OTHER): Payer: Self-pay

## 2013-02-26 VITALS — BP 113/68 | HR 89 | Ht 62.0 in | Wt 132.4 lb

## 2013-02-26 DIAGNOSIS — F988 Other specified behavioral and emotional disorders with onset usually occurring in childhood and adolescence: Secondary | ICD-10-CM

## 2013-02-26 DIAGNOSIS — F411 Generalized anxiety disorder: Secondary | ICD-10-CM

## 2013-02-26 MED ORDER — LISDEXAMFETAMINE DIMESYLATE 40 MG PO CAPS: 40.0000 mg | ORAL_CAPSULE | Freq: Every morning | ORAL | Status: DC

## 2013-02-26 MED ORDER — CLONAZEPAM 0.5 MG PO TABS: ORAL_TABLET | ORAL | Status: DC
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
## 2013-03-01 ENCOUNTER — Ambulatory Visit (HOSPITAL_COMMUNITY): Payer: Self-pay | Admitting: Psychiatry

## 2013-03-02 ENCOUNTER — Ambulatory Visit (HOSPITAL_COMMUNITY): Payer: Self-pay | Admitting: Psychiatry

## 2013-03-29 ENCOUNTER — Encounter (HOSPITAL_COMMUNITY): Payer: Self-pay | Admitting: Psychiatry

## 2013-03-29 ENCOUNTER — Ambulatory Visit (INDEPENDENT_AMBULATORY_CARE_PROVIDER_SITE_OTHER): Payer: Federal, State, Local not specified - Other | Admitting: Psychiatry

## 2013-03-29 VITALS — BP 113/61 | HR 101 | Ht 62.0 in | Wt 137.6 lb

## 2013-03-29 DIAGNOSIS — F988 Other specified behavioral and emotional disorders with onset usually occurring in childhood and adolescence: Secondary | ICD-10-CM

## 2013-03-29 MED ORDER — LISDEXAMFETAMINE DIMESYLATE 40 MG PO CAPS: 40.0000 mg | ORAL_CAPSULE | Freq: Every morning | ORAL | Status: DC

## 2013-03-29 NOTE — Progress Notes (Signed)
[facility] [address] Progress Note  Terri White [id number] 48 y.o.  03/29/2013 11:05 AM  Chief Complaint:  I'm doing better.          History of Present Illness:  Terri White came for her followup appointment.  She is sleeping better.  We cut down her Vyvanse and she has noticed much improvement in her anxiety and sleep.  Her weight is also improved.  She is relief because her legal issue isn't resolved.  She's not talking to her mother since Thanksgiving.  She is very happy because every time he talks to her it makes her more anxious and nervous.  She is hoping to go [place] in February.  Patient is doing better on her medication.  She is not using Klonopin more than she prescribed.  Patient is not drinking or using any illegal substances.  Her attention and focus is better.  She denies any agitation, anger or a mood swing.  She denies any panic attack.  She has any crying spells.  She has no side effects of medication.  Patient had a good Christmas.  She lives with her 2 children who are 79 and 57 years old.  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  Past psychiatric history. Patient has diagnosed with ADD in her school age.  She has a formal psychological testing by Dr. Evelene Croon.  She took Ritalin and Adderall in the past in her school years.  Patient denies any history of mania, psychosis, hallucination, agitation or aggressive behavior.  Patient denies any history of suicidal attempt or any inpatient psychiatric treatment.  Patient denies any history of ECT treatment.    Medical history Patient has arthritis.  Her primary care physician is Dr. Abigail Miyamoto but she usually goes to the emergency room for her basic physical needs.    Suicidal Ideation: No Plan Formed: No Patient has means to carry out plan: No  Homicidal Ideation: No Plan Formed: No Patient has means to carry out plan: No  Review of Systems: Psychiatric: Agitation: No Hallucination: No Depressed Mood:  Yes Insomnia: Yes Hypersomnia: No Altered Concentration: No Feels Worthless: No Grandiose Ideas: No Belief In Special Powers: No New/Increased Substance Abuse: No Compulsions: No  Neurologic: Headache: No Seizure: No Paresthesias: No  Medical History;  See above  Psychosocial History: See above  Outpatient Encounter Prescriptions as of 03/29/2013  Medication Sig  . clonazePAM (KLONOPIN) 0.5 MG tablet Take twice daily and 3rd if needed for For anxiety  . lisdexamfetamine (VYVANSE) 40 MG capsule Take 1 capsule (40 mg total) by mouth every morning.  . [DISCONTINUED] lisdexamfetamine (VYVANSE) 40 MG capsule Take 1 capsule (40 mg total) by mouth every morning.  . [DISCONTINUED] lisdexamfetamine (VYVANSE) 40 MG capsule Take 1 capsule (40 mg total) by mouth every morning.    No results found for this or any previous visit (from the past 2160 hour(s)).  Physical Exam: Constitutional:  BP 113/61  Pulse 101  Ht 5\' 2"  (1.575 m)  Wt 137 lb 9.6 oz (62.415 kg)  BMI 25.16 kg/m2  Musculoskeletal: Strength & Muscle Tone: within normal limits Gait & Station: normal Patient leans: Patient has a normal posture  Mental Status Examination;  Patient is well groomed and well dressed.  She maintains fair eye contact.  Her speech is clear and coherent.  She describes her mood is good and her affect is appropriate.  She denies any active or passive suicidal thoughts or homicidal thoughts.  She denies any auditory or  visual hallucination.  Her attention and concentration is okay.  There were no paranoia or delusions present at this time.  There are no flight of ideas or any loose association.  Her fund of knowledge is adequate.  There are no tremors or shakes present.  She is alert and oriented x3.  Her insight judgment and impulse control disorder.   Medical Decision Making (Choose Three): Established Problem, Stable/Improving (1), Review of Last Therapy Session (1) and Review of Medication  Regimen & Side Effects (2)  Assessment: Axis I: Attention deficit disorder  Axis II: Deferred  Axis III:  Past Medical History  Diagnosis Date  . Arthritis   . Back pain   . Anxiety     Axis IV: Moderate   Plan:  Patient is doing better since the dosage of stimulant has reduced.  She's not abusing her medication.  Recommend to continue Vyvanse 40 mg daily and Klonopin 0.5 mg twice a day and third one if needed.  Recommend to call Terri White back if she has any questions or any concern.  Followup in 3 months.   Tavon Magnussen T., MD 03/29/2013

## 2013-04-25 ENCOUNTER — Other Ambulatory Visit (HOSPITAL_COMMUNITY): Payer: Self-pay | Admitting: Psychiatry

## 2013-04-25 DIAGNOSIS — F411 Generalized anxiety disorder: Secondary | ICD-10-CM

## 2013-05-27 ENCOUNTER — Ambulatory Visit (HOSPITAL_COMMUNITY): Payer: Self-pay | Admitting: Psychiatry

## 2013-05-31 ENCOUNTER — Ambulatory Visit (INDEPENDENT_AMBULATORY_CARE_PROVIDER_SITE_OTHER): Payer: Federal, State, Local not specified - Other | Admitting: Psychiatry

## 2013-05-31 ENCOUNTER — Encounter (HOSPITAL_COMMUNITY): Payer: Self-pay | Admitting: Psychiatry

## 2013-05-31 VITALS — BP 129/81 | HR 90 | Ht 62.0 in | Wt 131.0 lb

## 2013-05-31 DIAGNOSIS — F988 Other specified behavioral and emotional disorders with onset usually occurring in childhood and adolescence: Secondary | ICD-10-CM

## 2013-05-31 DIAGNOSIS — F411 Generalized anxiety disorder: Secondary | ICD-10-CM

## 2013-05-31 MED ORDER — LISDEXAMFETAMINE DIMESYLATE 40 MG PO CAPS: 40.0000 mg | ORAL_CAPSULE | Freq: Every morning | ORAL | Status: DC

## 2013-05-31 MED ORDER — CLONAZEPAM 0.5 MG PO TABS: ORAL_TABLET | ORAL | Status: DC

## 2013-05-31 NOTE — Progress Notes (Signed)
[facility] [id number] Progress Note  [name] [id number] 48 y.o.  05/31/2013 11:32 AM  Chief Complaint:  Medication management and followup.          History of Present Illness:  Terri White came for her followup appointment.  She has been sick in the past and complaining of GI issues but now she is feeling better.  She had anxiety and nervousness when she find out that her kids father passed away because of flu complications.  She is feeling better now.  She is compliant with Vyvanse and benzodiazepine.  She does not ask for ODD.  Ears she denies any tremors or shakes.  She denies any insomnia, chest pain or any palpitation.  She is able to lose some weight however she believes because of GI issues and sickness.  Overall her energy level is good.  She denies any agitation or any anger.  Her attention and focus is good.  She recently moved one of her son to a different school because she was concerned being bullied .  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  She is able to do multitasking.  She lives with her 2 children who are 211 and 50 years old.    Past psychiatric history. Patient has diagnosed with ADD in her school age.  She has a formal psychological testing by Dr. Evelene CroonKaur.  She took Ritalin and Adderall in the past in her school years.  Patient denies any history of mania, psychosis, hallucination, agitation or aggressive behavior.  Patient denies any history of suicidal attempt or any inpatient psychiatric treatment.  Patient denies any history of ECT treatment.    Medical history Patient has arthritis.  Her primary care physician is Dr. Abigail Miyamotohacker but she usually goes to the emergency room for her basic physical needs.    Suicidal Ideation: No Plan Formed: No Patient has means to carry out plan: No  Homicidal Ideation: No Plan Formed: No Patient has means to carry out plan: No  Review  of Systems: Psychiatric: Agitation: No Hallucination: No Depressed Mood: Yes Insomnia: Yes Hypersomnia: No Altered Concentration: No Feels Worthless: No Grandiose Ideas: No Belief In Special Powers: No New/Increased Substance Abuse: No Compulsions: No  Neurologic: Headache: No Seizure: No Paresthesias: No  Medical History;  See above  Psychosocial History: See above  Outpatient Encounter Prescriptions as of 05/31/2013  Medication Sig  . clonazePAM (KLONOPIN) 0.5 MG tablet TAKE 1 TABLET TWICE DAILY AND A THIRD TABLET IF NEEDED FOR ANXIETY.  [name]. lisdexamfetamine (VYVANSE) 40 MG capsule Take 1 capsule (40 mg total) by mouth every morning.  . [DISCONTINUED] clonazePAM (KLONOPIN) 0.5 MG tablet TAKE 1 TABLET TWICE DAILY AND A THIRD TABLET IF NEEDED FOR ANXIETY.  . [DISCONTINUED] lisdexamfetamine (VYVANSE) 40 MG capsule Take 1 capsule (40 mg total) by mouth every morning.  . [DISCONTINUED] lisdexamfetamine (VYVANSE) 40 MG capsule Take 1 capsule (40 mg total) by mouth every morning.    No results found for this or any previous visit (from the past 2160 hour(s)).  Physical Exam: Constitutional:  BP 129/81  Pulse 90  Ht 5\' 2"  (1.575 m)  Wt 131 lb (59.421 kg)  BMI 23.95 kg/m2  Musculoskeletal: Strength & Muscle Tone: within normal limits Gait & Station: normal Patient leans: Patient has a normal posture  Mental Status Examination;  Patient is well groomed and well dressed.  She maintains fair eye contact.  Her speech is clear and coherent.  She describes her mood is good and her affect is appropriate.  She denies any active or passive suicidal thoughts or homicidal thoughts.  She denies any auditory or visual hallucination.  Her attention and concentration is okay.  There were no paranoia or delusions present at this time.  There are no flight of ideas or any loose association.  Her fund of knowledge is adequate.  There are no tremors or shakes present.  She is alert and oriented  x3.  Her insight judgment and impulse control disorder.   Established Problem, Stable/Improving (1), Review of Last Therapy Session (1) and Review of Medication Regimen & Side Effects (2)  Assessment: Axis I: Attention deficit disorder  Axis II: Deferred  Axis III:  Past Medical History  Diagnosis Date  . Arthritis   . Back pain   . Anxiety     Axis IV: Moderate   Plan:  Patient is doing better on her current psychotropic medication.  I will continue Vyvanse 40 mg daily and Klonopin 0.5 mg 2-3 times a day.  Discusses benzodiazepine dependence, tolerance and withdrawals .  She's not abusing her medication.  Recommend to call us back if she has any questions or any concern.  Followup in 3 months.   Mirha Brucato T., MD 05/31/2013

## 2013-08-23 ENCOUNTER — Telehealth (HOSPITAL_COMMUNITY): Payer: Self-pay

## 2013-08-31 ENCOUNTER — Encounter (HOSPITAL_COMMUNITY): Payer: Self-pay | Admitting: Psychiatry

## 2013-08-31 ENCOUNTER — Ambulatory Visit (HOSPITAL_COMMUNITY): Payer: Self-pay | Admitting: Psychiatry

## 2013-08-31 ENCOUNTER — Ambulatory Visit (INDEPENDENT_AMBULATORY_CARE_PROVIDER_SITE_OTHER): Payer: Federal, State, Local not specified - Other | Admitting: Psychiatry

## 2013-08-31 VITALS — BP 109/60 | HR 88 | Ht 62.0 in | Wt 134.0 lb

## 2013-08-31 DIAGNOSIS — F411 Generalized anxiety disorder: Secondary | ICD-10-CM

## 2013-08-31 DIAGNOSIS — F988 Other specified behavioral and emotional disorders with onset usually occurring in childhood and adolescence: Secondary | ICD-10-CM

## 2013-08-31 MED ORDER — LISDEXAMFETAMINE DIMESYLATE 40 MG PO CAPS: 40.0000 mg | ORAL_CAPSULE | Freq: Every morning | ORAL | Status: DC

## 2013-08-31 MED ORDER — CLONAZEPAM 0.5 MG PO TABS: ORAL_TABLET | ORAL | Status: DC

## 2013-08-31 NOTE — Progress Notes (Signed)
[facility] [id number] Progress Note  Terri White [id number] 48 y.o.  08/31/2013 10:22 AM  Chief Complaint:  Medication management and followup.          History of Present Illness:  Terri White came for her followup appointment.  She has been off of her Klonopin for past few days.  She mentioned that she call the pharmacy for refills however we do not receive any phone calls from the pharmacy to refill her Klonopin.  She endorsed she had increased anxiety during summers.  She endorsed nervousness, panic attack during summer.  However she is relieved that her children are doing very well in the school.  Her son is having a birthday in 2 days and she is taking him to water park.  She is happy that son was able to pull good grades.  She has 2 children.  Her daughter also got good grades.  The patient is able to do multitasking with the help of stimulant.  She denies any irritability, anger, mood swing.  She denies any tremors or shakes.  She denies any side effects of medication.  She is scheduled to see her primary care physician in August for physical and blood work.  Her appetite is okay.  Her vitals are stable.  She has no chest pain, insomnia of any headaches.  Past psychiatric history. Patient has diagnosed with ADD in her school age.  She has a formal psychological testing by Dr. Evelene CroonKaur.  She took Ritalin and Adderall in the past in her school years.  Patient denies any history of mania, psychosis, hallucination, agitation or aggressive behavior.  Patient denies any history of suicidal attempt or any inpatient psychiatric treatment.  Patient denies any history of ECT treatment.    Medical history Patient has arthritis.  Her primary care physician is Dr. Abigail Miyamotohacker but she usually goes to the emergency room for her basic physical needs.    Suicidal Ideation: No Plan Formed: No Patient has means to carry out plan: No  Homicidal Ideation: No Plan Formed: No Patient has means to carry out plan:  No  Review of Systems: Psychiatric: Agitation: No Hallucination: No Depressed Mood: No Insomnia: No Hypersomnia: No Altered Concentration: No Feels Worthless: No Grandiose Ideas: No Belief In Special Powers: No New/Increased Substance Abuse: No Compulsions: No  Neurologic: Headache: No Seizure: No Paresthesias: No  Medical History;  See above  Psychosocial History: See above  Outpatient Encounter Prescriptions as of 08/31/2013  Medication Sig  . clonazePAM (KLONOPIN) 0.5 MG tablet TAKE 1 TABLET TWICE DAILY AND A THIRD TABLET IF NEEDED FOR ANXIETY.  [name]. lisdexamfetamine (VYVANSE) 40 MG capsule Take 1 capsule (40 mg total) by mouth every morning.  . lisdexamfetamine (VYVANSE) 40 MG capsule Take 1 capsule (40 mg total) by mouth every morning.  . lisdexamfetamine (VYVANSE) 40 MG capsule Take 1 capsule (40 mg total) by mouth every morning.  . [DISCONTINUED] clonazePAM (KLONOPIN) 0.5 MG tablet TAKE 1 TABLET TWICE DAILY AND A THIRD TABLET IF NEEDED FOR ANXIETY.  . [DISCONTINUED] lisdexamfetamine (VYVANSE) 40 MG capsule Take 1 capsule (40 mg total) by mouth every morning.    No results found for this or any previous visit (from the past 2160 hour(s)).  Physical Exam: Constitutional:  BP 109/60  Pulse 88  Ht 5\' 2"  (1.575 m)  Wt 134 lb (60.782 kg)  BMI 24.50 kg/m2  Musculoskeletal: Strength & Muscle Tone: within normal limits Gait & Station: normal Patient leans: Patient has a normal posture  Mental  Status Examination;  Patient is well groomed and well dressed.  She maintains  good eye contact.  Her speech is clear and coherent.  She describes her mood is  anxious and her affect is appropriate.  She denies any active or passive suicidal thoughts or homicidal thoughts.  She denies any auditory or visual hallucination.  Her attention and concentration is okay.  There were no paranoia or delusions present at this time.  There are no flight of ideas or any loose association.  Her  fund of knowledge is adequate.  There are no tremors or shakes present.  She is alert and oriented x3.  Her insight judgment and impulse control disorder.   Established Problem, Stable/Improving (1), Review of Last Therapy Session (1) and Review of Medication Regimen & Side Effects (2)  Assessment: Axis I: Attention deficit disorder  Axis II: Deferred  Axis III:  Past Medical History  Diagnosis Date  . Arthritis   . Back pain   . Anxiety     Axis IV: Moderate   Plan:  Patient is stable on her current psychotropic medication.  I will continue Vyvanse 40 mg daily and  increase her Klonopin 0.5 mg 3 times a day during summer.  She was reduced her Klonopin 0.5 mg twice a day and throat only as needed after the summer .  Discusses benzodiazepine dependence, tolerance and withdrawals .  She's not abusing her medication.  she is going to see her primary care physician in August for physical and blood work.  I recommended to have her blood work results faxed to us.   Recommend to call us back if she has any questions or any concern.  Followup in 3 months.   ARFEEN,SYED T., MD 08/31/2013

## 2013-09-21 ENCOUNTER — Other Ambulatory Visit (HOSPITAL_COMMUNITY): Payer: Self-pay | Admitting: *Deleted

## 2013-09-21 NOTE — Telephone Encounter (Signed)
Received faxed request to authorize early refills on Vyvanse (last fill 6/24) and Klonopin(last fill 6/23) due to patient going out of town. Early refills authorized by Dr. Lolly MustacheArfeen.

## 2013-09-22 ENCOUNTER — Telehealth (HOSPITAL_COMMUNITY): Payer: Self-pay

## 2013-09-23 NOTE — Telephone Encounter (Signed)
Per pharmacy - able to fill Klonopin today at regular cost. They will contact patient

## 2013-11-22 ENCOUNTER — Other Ambulatory Visit (HOSPITAL_COMMUNITY): Payer: Self-pay | Admitting: Psychiatry

## 2013-11-22 DIAGNOSIS — F988 Other specified behavioral and emotional disorders with onset usually occurring in childhood and adolescence: Secondary | ICD-10-CM

## 2013-11-23 ENCOUNTER — Other Ambulatory Visit (HOSPITAL_COMMUNITY): Payer: Self-pay | Admitting: *Deleted

## 2013-11-24 MED ORDER — LISDEXAMFETAMINE DIMESYLATE 40 MG PO CAPS: 40.0000 mg | ORAL_CAPSULE | Freq: Every morning | ORAL | Status: DC

## 2013-11-24 NOTE — Telephone Encounter (Signed)
Patient left  VM: Was in [place] moving her father back to [place].left her Vyvanse and Klonopin. Appt is October 1.Can she get a refill to last until then? Dr. Lolly Mustache authorized refill of both medications

## 2013-11-27 ENCOUNTER — Emergency Department (HOSPITAL_COMMUNITY)
Admission: EM | Admit: 2013-11-27 | Discharge: 2013-11-27 | Disposition: A | Discharge status: Home / Self Care | Payer: Medicaid Other | Attending: Emergency Medicine | Admitting: Emergency Medicine

## 2013-11-27 ENCOUNTER — Emergency Department (HOSPITAL_COMMUNITY): Payer: Medicaid Other

## 2013-11-27 ENCOUNTER — Encounter (HOSPITAL_COMMUNITY): Payer: Self-pay | Admitting: Emergency Medicine

## 2013-11-27 DIAGNOSIS — Z87828 Personal history of other (healed) physical injury and trauma: Secondary | ICD-10-CM | POA: Insufficient documentation

## 2013-11-27 DIAGNOSIS — J069 Acute upper respiratory infection, unspecified: Secondary | ICD-10-CM | POA: Diagnosis not present

## 2013-11-27 DIAGNOSIS — R05 Cough: Secondary | ICD-10-CM | POA: Insufficient documentation

## 2013-11-27 DIAGNOSIS — R059 Cough, unspecified: Secondary | ICD-10-CM | POA: Insufficient documentation

## 2013-11-27 DIAGNOSIS — Z8739 Personal history of other diseases of the musculoskeletal system and connective tissue: Secondary | ICD-10-CM | POA: Insufficient documentation

## 2013-11-27 DIAGNOSIS — F988 Other specified behavioral and emotional disorders with onset usually occurring in childhood and adolescence: Secondary | ICD-10-CM | POA: Insufficient documentation

## 2013-11-27 DIAGNOSIS — F17200 Nicotine dependence, unspecified, uncomplicated: Secondary | ICD-10-CM | POA: Insufficient documentation

## 2013-11-27 DIAGNOSIS — B9789 Other viral agents as the cause of diseases classified elsewhere: Secondary | ICD-10-CM

## 2013-11-27 DIAGNOSIS — Z79899 Other long term (current) drug therapy: Secondary | ICD-10-CM | POA: Insufficient documentation

## 2013-11-27 DIAGNOSIS — F411 Generalized anxiety disorder: Secondary | ICD-10-CM | POA: Diagnosis not present

## 2013-11-27 HISTORY — DX: Other specified behavioral and emotional disorders with onset usually occurring in childhood and adolescence: F98.8

## 2013-11-27 HISTORY — DX: Other injury of unspecified body region, initial encounter: T14.8XXA

## 2013-11-27 MED ORDER — GUAIFENESIN ER 1200 MG PO TB12: 1.0000 | ORAL_TABLET | Freq: Two times a day (BID) | ORAL | Status: DC

## 2013-11-27 MED ORDER — ACETAMINOPHEN 325 MG PO TABS
650.0000 mg | ORAL_TABLET | Freq: Once | ORAL | Status: AC
  Administered 2013-11-27: 650 mg via ORAL
  Filled 2013-11-27: qty 2

## 2013-11-27 MED ORDER — PSEUDOEPHEDRINE HCL ER 120 MG PO TB12: 120.0000 mg | ORAL_TABLET | Freq: Two times a day (BID) | ORAL | Status: DC

## 2013-11-27 MED ORDER — BENZONATATE 100 MG PO CAPS: 100.0000 mg | ORAL_CAPSULE | Freq: Three times a day (TID) | ORAL | Status: DC

## 2013-11-27 NOTE — ED Notes (Signed)
Pt presents with cough with productive cough, thick yellow sputum. +nausea, body aches and fatigue.

## 2013-11-27 NOTE — ED Notes (Signed)
RN noted pt ambulating out of facility. Stopped pt to allow her to finish conversation regarding concerns about service. Pt feels no one cared about her condition and just wants to treat her with over the counter medicines. Again, RN allowed pt to vent her concerns prior to leaving.

## 2013-11-27 NOTE — ED Notes (Signed)
Pt placed in room 10, pt voiced concerns about wait time, the fact she had been told that we have 4 doctors on and she still had to wait over 2 hours for a room. RN tried to explain system, reminding her to fill out the survey when received in the mail. Pt was allowed to vent concerns, RN gave support in allowing her concerns to be heard. Pt concerned if MD would be seeing her. Dr Lynelle Doctor entered room during conversation. RN stepped out at this time.

## 2013-11-27 NOTE — ED Provider Notes (Signed)
CSN: [id number]     Arrival date & time 11/27/13  0508 History   First MD Initiated Contact with Patient 11/27/13 [phone]     Chief Complaint  Patient presents with  . Cough  . Generalized Body Aches    Patient is a 48 y.o. female presenting with cough. The history is provided by the patient.  Cough Cough characteristics:  Dry and productive Sputum characteristics:  Yellow Severity:  Moderate Onset quality:  Gradual Duration:  3 days Timing:  Intermittent Chronicity:  New Relieved by:  Nothing Worsened by:  Deep breathing Associated symptoms: chest pain, chills, myalgias and rhinorrhea   Associated symptoms: no fever     Past Medical History  Diagnosis Date  . Arthritis   . Back pain   . Anxiety   . Nerve damage   . ADD (attention deficit disorder)    Past Surgical History  Procedure Laterality Date  . Cesarean section    . Knee surgery    . Knee surgery      R knee  . Cesarean section  2002 and 2003   Family History  Problem Relation Age of Onset  . Osteoarthritis Mother   . Rheum arthritis Father   . ADD / ADHD Sister    History  Substance Use Topics  . Smoking status: Current Some Day Smoker -- 0.30 packs/day for 10 years    Types: Cigarettes  . Smokeless tobacco: Never Used  . Alcohol Use: No   OB History   Grav Para Term Preterm Abortions TAB SAB Ect Mult Living            2     Review of Systems  Constitutional: Positive for chills. Negative for fever.  HENT: Positive for rhinorrhea.   Respiratory: Positive for cough.   Cardiovascular: Positive for chest pain.  Musculoskeletal: Positive for myalgias.  All other systems reviewed and are negative.     Allergies  Diclofenac sodium; Pyridium; Sulfa antibiotics; Toradol; and Tramadol  Home Medications   Prior to Admission medications   Medication Sig Start Date End Date Taking? Authorizing Provider  clonazePAM (KLONOPIN) 0.5 MG tablet TAKE 1 TABLET TWICE DAILY AND A THIRD TABLET IF NEEDED FOR  ANXIETY. 11/24/13   Cleotis Nipper, MD  lisdexamfetamine (VYVANSE) 40 MG capsule Take 1 capsule (40 mg total) by mouth every morning. 11/24/13   Meghan Blankmann, NP   BP 102/43  Pulse 103  Temp(Src) 97.7 F (36.5 C) (Oral)  Resp 22  Ht  (1.575 m)  Wt 138 lb (62.596 kg)  BMI 25.23 kg/m2  SpO2 99%  LMP 11/13/2013 Physical Exam  Nursing note and vitals reviewed. Constitutional: She appears well-developed and well-nourished. No distress.  HENT:  Head: Normocephalic and atraumatic.  Right Ear: External ear normal.  Left Ear: External ear normal.  Eyes: Conjunctivae are normal. Right eye exhibits no discharge. Left eye exhibits no discharge. No scleral icterus.  Neck: Neck supple. No tracheal deviation present.  Cardiovascular: Normal rate, regular rhythm and intact distal pulses.   Pulmonary/Chest: Effort normal and breath sounds normal. No stridor. No respiratory distress. She has no wheezes. She has no rales.  Abdominal: Soft. Bowel sounds are normal. She exhibits no distension. There is no tenderness. There is no rebound and no guarding.  Musculoskeletal: She exhibits no edema and no tenderness.  Neurological: She is alert. She has normal strength. No cranial nerve deficit (no facial droop, extraocular movements intact, no slurred speech) or sensory deficit. She exhibits  normal muscle tone. She displays no seizure activity. Coordination normal.  Skin: Skin is warm and dry. No rash noted. She is not diaphoretic.  Psychiatric: She has a normal mood and affect.    ED Course  Procedures (including critical care time) Labs Review Labs Reviewed - No data to display  Imaging Review Dg Chest 2 View  11/27/2013   CLINICAL DATA:  Productive cough, chest pain and shortness of breath. History of smoking.  EXAM: CHEST  2 VIEW  COMPARISON:  Chest radiograph performed 03/26/2012  FINDINGS: The lungs are well-aerated and clear. There is no evidence of focal opacification, pleural effusion or  pneumothorax.  The heart is normal in size; the mediastinal contour is within normal limits. No acute osseous abnormalities are seen.  IMPRESSION: No acute cardiopulmonary process seen.   Electronically Signed   By: Roanna Raider M.D.   On: 11/27/2013 06:13      MDM   Final diagnoses:  Viral URI with cough    No wheezing on exam.  No respiratory difficulty.  Pt does smoke.  Likely viral URI.  Doubt acs, pe.  Requested abx and steroids.  Explained that illness is likely viral without PNA on xray.  Abx would not be helpful.  No wheezing on exam.  She denies recurrent steroid or albuterol use.  Explained that steroids would not help the infectious illness.      Linwood Dibbles, MD 11/27/13 [phone]

## 2013-11-27 NOTE — ED Notes (Signed)
She becomes incensed when I give her her discharge instructions; stating "I waited all this time (she uses profanity) call my kids to let the bail bondsman know this (profanity again).  I inform her that Dr. Lynelle Doctor is a very competent doctor and that he has made his diagnosis.  I then notify security, however, she chooses to leave peacefully.

## 2013-11-27 NOTE — Discharge Instructions (Signed)
Cough, Adult   A cough is a reflex. It helps you clear your throat and airways. A cough can help heal your body. A cough can last 2 or 3 weeks (acute) or may last more than 8 weeks (chronic). Some common causes of a cough can include an infection, allergy, or a cold.  HOME CARE  · Only take medicine as told by your doctor.  · If given, take your medicines (antibiotics) as told. Finish them even if you start to feel better.  · Use a cold steam vaporizer or humidifier in your home. This can help loosen thick spit (secretions).  · Sleep so you are almost sitting up (semi-upright). Use pillows to do this. This helps reduce coughing.  · Rest as needed.  · Stop smoking if you smoke.  GET HELP RIGHT AWAY IF:  · You have yellowish-white fluid (pus) in your thick spit.  · Your cough gets worse.  · Your medicine does not reduce coughing, and you are losing sleep.  · You cough up blood.  · You have trouble breathing.  · Your pain gets worse and medicine does not help.  · You have a fever.  MAKE SURE YOU:   · Understand these instructions.  · Will watch your condition.  · Will get help right away if you are not doing well or get worse.  Document Released: 11/08/2010 Document Revised: 07/12/2013 Document Reviewed: 11/08/2010  ExitCare® Patient Information ©2015 [employer]. This information is not intended to replace advice given to you by your health care provider. Make sure you discuss any questions you have with your health care provider.

## 2013-12-01 ENCOUNTER — Ambulatory Visit (HOSPITAL_COMMUNITY): Payer: Self-pay | Admitting: Psychiatry

## 2013-12-09 ENCOUNTER — Encounter (HOSPITAL_COMMUNITY): Payer: Self-pay | Admitting: Psychiatry

## 2013-12-09 ENCOUNTER — Ambulatory Visit (INDEPENDENT_AMBULATORY_CARE_PROVIDER_SITE_OTHER): Payer: Federal, State, Local not specified - Other | Admitting: Psychiatry

## 2013-12-09 VITALS — BP 122/74 | HR 88 | Ht 62.0 in | Wt 137.0 lb

## 2013-12-09 DIAGNOSIS — F909 Attention-deficit hyperactivity disorder, unspecified type: Secondary | ICD-10-CM

## 2013-12-09 DIAGNOSIS — F411 Generalized anxiety disorder: Secondary | ICD-10-CM

## 2013-12-09 DIAGNOSIS — F988 Other specified behavioral and emotional disorders with onset usually occurring in childhood and adolescence: Secondary | ICD-10-CM

## 2013-12-09 MED ORDER — LISDEXAMFETAMINE DIMESYLATE 50 MG PO CAPS: 50.0000 mg | ORAL_CAPSULE | Freq: Every morning | ORAL | Status: DC

## 2013-12-09 MED ORDER — CLONAZEPAM 0.5 MG PO TABS: 0.5000 mg | ORAL_TABLET | Freq: Two times a day (BID) | ORAL | Status: DC

## 2013-12-09 NOTE — Progress Notes (Signed)
[facility] [id number] Progress Note  Terri White [id number] 48 y.o.  12/09/2013 10:01 AM  Chief Complaint:  Medication management and followup.          History of Present Illness:  [name] came for her followup appointment.  She is complaining of increased stress because of her daughter who has been bullied at school.  Sometimes she feel her current Vyvanse is not enough .  She has difficulty doing multitasking.  She denies any side effects of medication.  She is taking Klonopin 0.5 mg twice a day.  Recently she is seen in the emergency room because of cough .  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  She denies any tremors  , shakes or any other side effects.  Her appetite is okay.  She is concerned about her weight which is increased slightly in past few months.  She was to go back on Adderall because she find out from the pharmacist that Vyvanse is not for ADD.  Sometimes she feels tired and lack of motivation to do things.  She denies any chest pain, insomnia or any irritability.  She denies any crying spells.  Her appetite is okay.  Her vitals are stable.  She is not drinking or using any illegal substances.  She did not go to see her primary care physician in August because she was very busy.  She lives with her children.  Past psychiatric history. Patient has diagnosed with ADD in her school age.  She has a formal psychological testing by Dr. Evelene White.  She took Ritalin and Adderall in the past in her school years.  Patient denies any history of mania, psychosis, hallucination, agitation or aggressive behavior.  Patient denies any history of suicidal attempt or any inpatient psychiatric treatment.  Patient denies any history of ECT treatment.    Medical history Patient has arthritis.  Her primary care physician is Dr. Abigail White but she usually goes to the emergency room for her basic physical needs.    Suicidal Ideation: No Plan Formed: No Patient has  means to carry out plan: No  Homicidal Ideation: No Plan Formed: No Patient has means to carry out plan: No  Review of Systems: Psychiatric: Agitation: No Hallucination: No Depressed Mood: No Insomnia: No Hypersomnia: No Altered Concentration: No Feels Worthless: No Grandiose Ideas: No Belief In Special Powers: No New/Increased Substance Abuse: No Compulsions: No  Neurologic: Headache: No Seizure: No Paresthesias: No  Medical History;  See above  Psychosocial History: See above  Outpatient Encounter Prescriptions as of 12/09/2013  Medication Sig  . benzonatate (TESSALON) 100 MG capsule Take 1 capsule (100 mg total) by mouth every 8 (eight) hours.  . clonazePAM (KLONOPIN) 0.5 MG tablet Take 1 tablet (0.5 mg total) by mouth 2 (two) times daily.  [name] dextromethorphan (DELSYM) 30 MG/5ML liquid Take 30 mg by mouth as needed for cough.  . Guaifenesin 1200 MG TB12 Take 1 tablet (1,200 mg total) by mouth 2 (two) times daily at 10 AM and 5 PM.  . lisdexamfetamine (VYVANSE) 50 MG capsule Take 1 capsule (50 mg total) by mouth every morning.  . Multiple Vitamin (MULTIVITAMIN WITH MINERALS) TABS tablet Take 1 tablet by mouth daily.  . pseudoephedrine (SUDAFED 12 HOUR) 120 MG 12 hr tablet Take 1 tablet (120 mg total) by mouth every 12 (twelve) hours.  . [DISCONTINUED] clonazePAM (KLONOPIN) 0.5 MG tablet Take 0.5 mg by mouth 4 (four) times daily as needed for  anxiety.  . [DISCONTINUED] lisdexamfetamine (VYVANSE) 40 MG capsule Take 1 capsule (40 mg total) by mouth every morning.  . [DISCONTINUED] lisdexamfetamine (VYVANSE) 50 MG capsule Take 1 capsule (50 mg total) by mouth every morning.    No results found for this or any previous visit (from the past 2160 hour(s)).  Physical Exam: Constitutional:  BP 122/74  Pulse 88  Ht 5\' 2"  (1.575 m)  Wt 137 lb (62.143 kg)  BMI 25.05 kg/m2  LMP 11/13/2013  Musculoskeletal: Strength & Muscle Tone: within normal limits Gait & Station:  normal Patient leans: Patient has a normal posture  Mental Status Examination;  Patient is well groomed and well dressed.  She maintains  good eye contact.  Her speech is clear and coherent.  She describes her mood is anxious and her affect is appropriate.  She denies any active or passive suicidal thoughts or homicidal thoughts.  She denies any auditory or visual hallucination.  Her attention and concentration is okay.  There were no paranoia or delusions present at this time.  There are no flight of ideas or any loose association.  Her fund of knowledge is adequate.  There are no tremors or shakes present.  She is alert and oriented x3.  Her insight judgment and impulse control disorder.   Established Problem, Stable/Improving (1), Review of Psycho-Social Stressors (1), Review of Last Therapy Session (1), Review of Medication Regimen & Side Effects (2) and Review of New Medication or Change in Dosage (2)  Assessment: Axis I: Attention deficit disorder  Axis II: Deferred  Axis III:  Past Medical History  Diagnosis Date  . Arthritis   . Back pain   . Anxiety   . Nerve damage   . ADD (attention deficit disorder)     Axis IV: Moderate   Plan:  Reassurance given that Vyvanse is for ADD .  Recommended to try 50 mg to help residual symptoms of inability to focus and concentration.  Explained that recent stressors related to her daughter may have cause worsening of the symptoms.  Encourage counseling but patient declined.  At this time continue Klonopin 0.5 mg twice a day .  Increase Vyvanse 50 mg daily.  Encouraged to keep appointment with her primary care physician for physical and blood work. Recommend to call us back if she has any questions or any concern.  Followup in 3 months.   Terri Hakim T., MD 12/09/2013

## 2014-01-05 ENCOUNTER — Other Ambulatory Visit (HOSPITAL_COMMUNITY): Payer: Self-pay | Admitting: *Deleted

## 2014-01-05 ENCOUNTER — Telehealth (HOSPITAL_COMMUNITY): Payer: Self-pay

## 2014-01-05 DIAGNOSIS — F988 Other specified behavioral and emotional disorders with onset usually occurring in childhood and adolescence: Secondary | ICD-10-CM

## 2014-01-05 MED ORDER — LISDEXAMFETAMINE DIMESYLATE 50 MG PO CAPS: 50.0000 mg | ORAL_CAPSULE | Freq: Every morning | ORAL | Status: DC

## 2014-01-05 NOTE — Telephone Encounter (Signed)
01/05/14 1:26pm Patient Terri White DL #[id number] to pick-up rx script.[name].[name]/sh

## 2014-02-09 ENCOUNTER — Telehealth (HOSPITAL_COMMUNITY): Payer: Self-pay | Admitting: *Deleted

## 2014-02-09 DIAGNOSIS — F988 Other specified behavioral and emotional disorders with onset usually occurring in childhood and adolescence: Secondary | ICD-10-CM

## 2014-02-09 NOTE — Telephone Encounter (Signed)
In office with son. Needs new RX for stimulant. Wants to change back to Adderall from current med of Vyvanse.Next appt 03/28/14.

## 2014-02-10 ENCOUNTER — Telehealth (HOSPITAL_COMMUNITY): Payer: Self-pay | Admitting: *Deleted

## 2014-02-10 ENCOUNTER — Telehealth (HOSPITAL_COMMUNITY): Payer: Self-pay

## 2014-02-10 MED ORDER — LISDEXAMFETAMINE DIMESYLATE 50 MG PO CAPS: 50.0000 mg | ORAL_CAPSULE | Freq: Every morning | ORAL | Status: DC

## 2014-02-10 NOTE — Addendum Note (Signed)
Addended by: Tonny BollmanKNISLEY, Collin Hendley M on: 02/10/2014 02:11 PM   Modules accepted: Orders

## 2014-02-10 NOTE — Telephone Encounter (Signed)
02/10/14 3:19pm Patient [id number]  Came and pick-up rx script.[name].[name]/sh

## 2014-02-10 NOTE — Telephone Encounter (Signed)
Contacted patient, told mer Dr. Lolly MustacheArfeen wants to continue Vyvanse at this time.She will be called when Rx ready for pick up

## 2014-02-10 NOTE — Telephone Encounter (Signed)
Opened in error

## 2014-03-21 ENCOUNTER — Other Ambulatory Visit (HOSPITAL_COMMUNITY): Payer: Self-pay

## 2014-03-21 ENCOUNTER — Other Ambulatory Visit (HOSPITAL_COMMUNITY): Payer: Self-pay | Admitting: Psychiatry

## 2014-03-21 ENCOUNTER — Other Ambulatory Visit (HOSPITAL_COMMUNITY): Payer: Self-pay | Admitting: *Deleted

## 2014-03-21 ENCOUNTER — Telehealth (HOSPITAL_COMMUNITY): Payer: Self-pay

## 2014-03-21 DIAGNOSIS — F411 Generalized anxiety disorder: Secondary | ICD-10-CM

## 2014-03-21 DIAGNOSIS — F988 Other specified behavioral and emotional disorders with onset usually occurring in childhood and adolescence: Secondary | ICD-10-CM

## 2014-03-21 MED ORDER — LISDEXAMFETAMINE DIMESYLATE 50 MG PO CAPS: 50.0000 mg | ORAL_CAPSULE | Freq: Every morning | ORAL | Status: DC

## 2014-03-21 NOTE — Telephone Encounter (Signed)
Pt called for Refill of Vyvanse.

## 2014-03-21 NOTE — Telephone Encounter (Signed)
03/21/14 4:32pm Patient came and pick-up rx script - [id number] #[id number] Elinor ParkinsonAngela Raffety.[name].[name]/sh

## 2014-03-23 ENCOUNTER — Other Ambulatory Visit (HOSPITAL_COMMUNITY): Payer: Self-pay | Admitting: Psychiatry

## 2014-03-23 MED ORDER — CLONAZEPAM 0.5 MG PO TABS: 0.5000 mg | ORAL_TABLET | Freq: Two times a day (BID) | ORAL | Status: DC

## 2014-03-23 NOTE — Telephone Encounter (Signed)
I received a fax request for Terri White's Clonazepam 0.5mg  one tablet twice a day refill.  It was last ordered 12/09/13 with 2 refills and her next appointment is Monday 03/28/14.

## 2014-03-23 NOTE — Telephone Encounter (Signed)
[facility] time [facility] authorization of Dr. Lolly MustacheArfeen to cover patient until upcoming evaluation.

## 2014-03-28 ENCOUNTER — Encounter (HOSPITAL_COMMUNITY): Payer: Self-pay | Admitting: Psychiatry

## 2014-03-28 ENCOUNTER — Ambulatory Visit (INDEPENDENT_AMBULATORY_CARE_PROVIDER_SITE_OTHER): Payer: Federal, State, Local not specified - Other | Admitting: Psychiatry

## 2014-03-28 VITALS — BP 112/68 | HR 60 | Ht 62.0 in | Wt 138.0 lb

## 2014-03-28 DIAGNOSIS — F411 Generalized anxiety disorder: Secondary | ICD-10-CM

## 2014-03-28 DIAGNOSIS — F909 Attention-deficit hyperactivity disorder, unspecified type: Secondary | ICD-10-CM

## 2014-03-28 MED ORDER — CLONAZEPAM 0.5 MG PO TABS: 0.5000 mg | ORAL_TABLET | Freq: Two times a day (BID) | ORAL | Status: DC

## 2014-03-28 MED ORDER — AMPHETAMINE-DEXTROAMPHETAMINE 15 MG PO TABS: 30.0000 mg | ORAL_TABLET | Freq: Every day | ORAL | Status: DC
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
## 2014-03-31 ENCOUNTER — Telehealth (HOSPITAL_COMMUNITY): Payer: Self-pay

## 2014-03-31 NOTE — Telephone Encounter (Signed)
[phone] stating patient was new to them and brought in prescriptions today from Dr. Lolly MustacheArfeen dated 03/28/14.  Informed this nurse we had the wrong birth date down per patient as should be [birth year].  Pharmacist reported [facility] was now [facility] and question if patient may be using different aliases as is also going to different pharmacies.  Will inform Dr. Lolly MustacheArfeen of question.

## 2014-04-09 ENCOUNTER — Encounter (HOSPITAL_COMMUNITY): Payer: Self-pay | Admitting: Emergency Medicine

## 2014-04-09 ENCOUNTER — Emergency Department (HOSPITAL_COMMUNITY)
Admission: EM | Admit: 2014-04-09 | Discharge: 2014-04-09 | Disposition: A | Discharge status: Home / Self Care | Payer: Medicaid Other | Attending: Emergency Medicine | Admitting: Emergency Medicine

## 2014-04-09 ENCOUNTER — Emergency Department (HOSPITAL_COMMUNITY): Payer: Medicaid Other

## 2014-04-09 DIAGNOSIS — Z87828 Personal history of other (healed) physical injury and trauma: Secondary | ICD-10-CM | POA: Diagnosis not present

## 2014-04-09 DIAGNOSIS — F909 Attention-deficit hyperactivity disorder, unspecified type: Secondary | ICD-10-CM | POA: Diagnosis not present

## 2014-04-09 DIAGNOSIS — J019 Acute sinusitis, unspecified: Secondary | ICD-10-CM | POA: Diagnosis not present

## 2014-04-09 DIAGNOSIS — K047 Periapical abscess without sinus: Secondary | ICD-10-CM | POA: Diagnosis not present

## 2014-04-09 DIAGNOSIS — F419 Anxiety disorder, unspecified: Secondary | ICD-10-CM | POA: Diagnosis not present

## 2014-04-09 DIAGNOSIS — R059 Cough, unspecified: Secondary | ICD-10-CM

## 2014-04-09 DIAGNOSIS — Z79899 Other long term (current) drug therapy: Secondary | ICD-10-CM | POA: Diagnosis not present

## 2014-04-09 DIAGNOSIS — R05 Cough: Secondary | ICD-10-CM

## 2014-04-09 DIAGNOSIS — R51 Headache: Secondary | ICD-10-CM | POA: Insufficient documentation

## 2014-04-09 DIAGNOSIS — Z8739 Personal history of other diseases of the musculoskeletal system and connective tissue: Secondary | ICD-10-CM | POA: Insufficient documentation

## 2014-04-09 DIAGNOSIS — Z720 Tobacco use: Secondary | ICD-10-CM | POA: Insufficient documentation

## 2014-04-09 DIAGNOSIS — R509 Fever, unspecified: Secondary | ICD-10-CM

## 2014-04-09 DIAGNOSIS — R0981 Nasal congestion: Secondary | ICD-10-CM | POA: Diagnosis present

## 2014-04-09 MED ORDER — AMOXICILLIN-POT CLAVULANATE 875-125 MG PO TABS: 1.0000 | ORAL_TABLET | Freq: Two times a day (BID) | ORAL | Status: DC

## 2014-04-09 MED ORDER — BENZONATATE 100 MG PO CAPS: 100.0000 mg | ORAL_CAPSULE | Freq: Three times a day (TID) | ORAL | Status: DC

## 2014-04-09 MED ORDER — IBUPROFEN 800 MG PO TABS
800.0000 mg | ORAL_TABLET | Freq: Once | ORAL | Status: AC
  Administered 2014-04-09: 800 mg via ORAL
  Filled 2014-04-09: qty 1

## 2014-04-09 MED ORDER — ONDANSETRON 8 MG PO TBDP
8.0000 mg | ORAL_TABLET | Freq: Once | ORAL | Status: AC
  Administered 2014-04-09: 8 mg via ORAL
  Filled 2014-04-09: qty 1

## 2014-04-09 MED ORDER — SALINE SPRAY 0.65 % NA SOLN
1.0000 | Freq: Once | NASAL | Status: AC
  Administered 2014-04-09: 1 via NASAL
  Filled 2014-04-09: qty 44

## 2014-04-09 NOTE — ED Notes (Signed)
Awaiting arrival of nasal spray from pharmacy
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
## 2014-04-09 NOTE — ED Notes (Signed)
While in room to admin Zofran, patient waving arms around in a dramatic manner Patient stated that she is "in the shape that I am in because no one will listen to me or give me pain medications" This nurse offered for EDP to come back to speak with patient Patient declined this offer stating, "I'm not in the mood for a fight tonight. Just turn off the lights and don't say anything." Patient remains in NAD

## 2014-04-09 NOTE — ED Notes (Signed)

## 2014-04-09 NOTE — Discharge Instructions (Signed)
Sinusitis °Sinusitis is redness, soreness, and inflammation of the paranasal sinuses. Paranasal sinuses are air pockets within the bones of your face (beneath the eyes, the middle of the forehead, or above the eyes). In healthy paranasal sinuses, mucus is able to drain out, and air is able to circulate through them by way of your nose. However, when your paranasal sinuses are inflamed, mucus and air can become trapped. This can allow bacteria and other germs to grow and cause infection. °Sinusitis can develop quickly and last only a short time (acute) or continue over a long period (chronic). Sinusitis that lasts for more than 12 weeks is considered chronic.  °CAUSES  °Causes of sinusitis include: °· Allergies. °· Structural abnormalities, such as displacement of the cartilage that separates your nostrils (deviated septum), which can decrease the air flow through your nose and sinuses and affect sinus drainage. °· Functional abnormalities, such as when the small hairs (cilia) that line your sinuses and help remove mucus do not work properly or are not present. °SIGNS AND SYMPTOMS  °Symptoms of acute and chronic sinusitis are the same. The primary symptoms are pain and pressure around the affected sinuses. Other symptoms include: °· Upper toothache. °· Earache. °· Headache. °· Bad breath. °· Decreased sense of smell and taste. °· A cough, which worsens when you are lying flat. °· Fatigue. °· Fever. °· Thick drainage from your nose, which often is green and may contain pus (purulent). °· Swelling and warmth over the affected sinuses. °DIAGNOSIS  °Your health care provider will perform a physical exam. During the exam, your health care provider may: °· Look in your nose for signs of abnormal growths in your nostrils (nasal polyps). °· Tap over the affected sinus to check for signs of infection. °· View the inside of your sinuses (endoscopy) using an imaging device that has a light attached (endoscope). °If your health  care provider suspects that you have chronic sinusitis, one or more of the following tests may be recommended: °· Allergy tests. °· Nasal culture. A sample of mucus is taken from your nose, sent to a lab, and screened for bacteria. °· Nasal cytology. A sample of mucus is taken from your nose and examined by your health care provider to determine if your sinusitis is related to an allergy. °TREATMENT  °Most cases of acute sinusitis are related to a viral infection and will resolve on their own within 10 days. Sometimes medicines are prescribed to help relieve symptoms (pain medicine, decongestants, nasal steroid sprays, or saline sprays).  °However, for sinusitis related to a bacterial infection, your health care provider will prescribe antibiotic medicines. These are medicines that will help kill the bacteria causing the infection.  °Rarely, sinusitis is caused by a fungal infection. In theses cases, your health care provider will prescribe antifungal medicine. °For some cases of chronic sinusitis, surgery is needed. Generally, these are cases in which sinusitis recurs more than 3 times per year, despite other treatments. °HOME CARE INSTRUCTIONS  °· Drink plenty of water. Water helps thin the mucus so your sinuses can drain more easily. °· Use a humidifier. °· Inhale steam 3 to 4 times a day (for example, sit in the bathroom with the shower running). °· Apply a warm, moist washcloth to your face 3 to 4 times a day, or as directed by your health care provider. °· Use saline nasal sprays to help moisten and clean your sinuses. °· Take medicines only as directed by your health care provider. °·   If you were prescribed either an antibiotic or antifungal medicine, finish it all even if you start to feel better. SEEK IMMEDIATE MEDICAL CARE IF:  You have increasing pain or severe headaches.  You have nausea, vomiting, or drowsiness.  You have swelling around your face.  You have vision problems.  You have a stiff  neck.  You have difficulty breathing. MAKE SURE YOU:   Understand these instructions.  Will watch your condition.  Will get help right away if you are not doing well or get worse. Document Released: 02/25/2005 Document Revised: 07/12/2013 Document Reviewed: 03/12/2011 [facility] Patient Information 2015 [place], [place]. This information is not intended to replace advice given to you by your health care provider. Make sure you discuss any questions you have with your health care provider. Dental Abscess A dental abscess is a collection of infected fluid (pus) from a bacterial infection in the inner part of the tooth (pulp). It usually occurs at the end of the tooth's root.  CAUSES   Severe tooth decay.  Trauma to the tooth that allows bacteria to enter into the pulp, such as a broken or chipped tooth. SYMPTOMS   Severe pain in and around the infected tooth.  Swelling and redness around the abscessed tooth or in the mouth or face.  Tenderness.  Pus drainage.  Bad breath.  Bitter taste in the mouth.  Difficulty swallowing.  Difficulty opening the mouth.  Nausea.  Vomiting.  Chills.  Swollen neck glands. DIAGNOSIS   A medical and dental history will be taken.  An examination will be performed by tapping on the abscessed tooth.  X-rays may be taken of the tooth to identify the abscess. TREATMENT The goal of treatment is to eliminate the infection. You may be prescribed antibiotic medicine to stop the infection from spreading. A root canal may be performed to save the tooth. If the tooth cannot be saved, it may be pulled (extracted) and the abscess may be drained.  HOME CARE INSTRUCTIONS  Only take over-the-counter or prescription medicines for pain, fever, or discomfort as directed by your caregiver.  Rinse your mouth (gargle) often with salt water ( tsp salt in 8 oz [250 ml] of warm water) to relieve pain or swelling.  Do not drive after taking pain medicine  (narcotics).  Do not apply heat to the outside of your face.  Return to your dentist for further treatment as directed. SEEK MEDICAL CARE IF:  Your pain is not helped by medicine.  Your pain is getting worse instead of better. SEEK IMMEDIATE MEDICAL CARE IF:  You have a fever or persistent symptoms for more than 2-3 days.  You have a fever and your symptoms suddenly get worse.  You have chills or a very bad headache.  You have problems breathing or swallowing.  You have trouble opening your mouth.  You have swelling in the neck or around the eye. Document Released: 02/25/2005 Document Revised: 11/20/2011 Document Reviewed: 06/05/2010 [facility] Patient Information 2015 [place], [place]. This information is not intended to replace advice given to you by your health care provider. Make sure you discuss any questions you have with your health care provider.  Emergency Department Resource Guide 1) Find a Doctor and Pay Out of Pocket Although you won't have to find out who is covered by your insurance plan, it is a good idea to ask around and get recommendations. You will then need to call the office and see if the doctor you have chosen will accept  you as a new patient and what types of options they offer for patients who are self-pay. Some doctors offer discounts or will set up payment plans for their patients who do not have insurance, but you will need to ask so you aren't surprised when you get to your appointment.  2) Contact Your Local Health Department Not all health departments have doctors that can see patients for sick visits, but many do, so it is worth a call to see if yours does. If you don't know where your local health department is, you can check in your phone book. The CDC also has a tool to help you locate your state's health department, and many state websites also have listings of all of their local health departments.  3) Find a Walk-in Clinic If your illness is not  likely to be very severe or complicated, you may want to try a walk in clinic. These are popping up all over the country in pharmacies, drugstores, and shopping centers. They're usually staffed by nurse practitioners or physician assistants that have been trained to treat common illnesses and complaints. They're usually fairly quick and inexpensive. However, if you have serious medical issues or chronic medical problems, these are probably not your best option.  No Primary Care Doctor: - Call Health Connect at  [phone] - they can help you locate a primary care doctor that  accepts your insurance, provides certain services, etc. - Physician Referral Service- [phone]  Chronic Pain Problems: Organization         Address  Phone   Notes  [facility]  [phone] Patients need to be referred by their primary care doctor.   Medication Assistance: Organization         Address  Phone   Notes  [facility] Medication [facility] [address] [place], [place] [address] [phone]) [phone] --Must be a resident of [place] [facility] -- Must have NO insurance coverage whatsoever (no Medicaid/ Medicare, etc.) -- The pt. MUST have a primary care doctor that directs their care regularly and follows them in the community   MedAssist  [phone]   [employer]  [phone]) [phone]    Agencies that provide inexpensive medical care: Organization         Address  Phone   Notes  [facility]  [phone]   [facility]    [phone]) [phone]   [facility] [address] [place], [place] [address] [phone]) [phone]   [facility] [address], [place] [phone]   [facility]    [phone](336) [phone]   [facility]    [phone](336) [phone]   [facility]  [address], [place] Phone:  [phone], Fax:  [phone] Hours of Operation:  9 am - 6 pm,  M-F.  Also accepts Medicaid/Medicare and self-pay.  [facility]  [address],  Phone: [phone](336) [phone], Fax: [phone]) [phone]. Hours of Operation:  8:30 am - 5:30 pm, M-F.  Also accepts Medicaid and self-pay.  [facility] [place] [address], [place] Phone: [phone]) [phone]   [facility] [address], [place], [place] [phone], Ext. [phone] Mondays & Thursdays: 7-9 AM.  First 15 patients are seen on a first come, first serve basis.    Medicaid-accepting [facility] Providers:  Organization  Address  Phone   Notes  [facility] [address], [place] [phone] Also accepts self-pay patients.  [facility] [address], [place]  [phone]) [phone]   [facility] [address], [place] [phone]) [phone]   [facility] [address] [place] [address], [place] [phone]) [phone]   Renaye RakersVeita Bland [address], [place]   [phone] Only accepts [place] Access [place] patients after they have their name applied to their card.   Self-Pay (no insurance) in [facility] [place]:  Organization         Address  Phone   Notes  Sickle Cell Patients, [facility] [address], [place] [phone]) [phone]   [facility] [address], [place] [phone]   [facility] [place]  [address], [place] [phone]) [phone]   [facility]/Dr. Osei-Bonsu  [address] [place] [address], [place] or [phone], [place] [phone]) [phone] Phone number for both [place] and [place] locations is the same.  [facility] [address], [place] [phone]   [facility] [address] [place] [address], [place] or [address] [phone]) [phone]   [facility] [address], [place] [phone]) [phone], phone; [phone]) [phone], fax Sees patients 1st and 3rd Saturday of every month.  Must not qualify for public or private insurance (i.e. Medicaid, Medicare, [place] Health Choice, Veterans' Benefits)  Household income should be no more than 200% of the poverty level The clinic cannot treat you if you are pregnant or think you are pregnant  Sexually transmitted diseases are not treated at the clinic.    Dental Care: Organization         Address  Phone  Notes  [facility] [facility] [address], [place] [phone] Accepts children up to age 51 who are enrolled in [place] or [place] Health Choice; pregnant women with a Medicaid card; and children who have applied for Medicaid or [place] Health Choice, but were declined, whose parents can pay a reduced fee at time of service.  [facility] of [facility] [place]  [address], [place] [phone]) [phone] Accepts children up to age 51 who are enrolled in [place] or [place] Health Choice; pregnant women with a Medicaid card; and children who have applied for Medicaid or [place] Health Choice, but were declined, whose parents can pay a reduced fee at time of service.  [facility]  [address], [place] [phone] Patients are seen by appointment only. Walk-ins are not accepted. [facility] will see patients 51 years of age and older. Monday - Tuesday (8am-5pm) Most Wednesdays (8:30-5pm) $30 per visit, cash only  [facility]  [address], [facility] [place] [phone] Patients are seen by appointment only. Walk-ins are not accepted. [facility] will see patients 318 years of age and older. One Wednesday Evening (Monthly: Volunteer Based).  $30 per visit, cash only  [employer] [facility]  [phone] for adults; Children under age 434, call Graduate Pediatric Dentistry at [phone])  [phone]. Children aged 144-14, please call [phone] to request a pediatric application.  Dental services are provided in all areas of dental care including  fillings, crowns and bridges, complete and partial dentures, implants, gum treatment, root canals, and extractions. Preventive care is also provided. Treatment is provided to both adults and children. Patients are selected via a lottery and there is often a waiting list.   [facility] [address] [place]  [phone] [email or web address]   [facility] [address] [place], [place] [phone], Ext. [phone] Second and Fourth Thursday of each month, opens at 6:30 AM; Clinic ends at 9 AM.  Patients are seen on a first-come first-served basis, and a limited number are seen during each clinic.   [facility]  [address] [place]ins [place], [place] [phone]   Eligibility Requirements You must have lived in [place], [place], or [place] counties for at least the last three months.   You cannot be eligible for state or federal sponsored [place], including [employer], [place], or [employer].   You generally cannot be eligible for healthcare insurance through your employer.    How to apply: Eligibility screenings are held every Tuesday and Wednesday afternoon from 1:00 pm until 4:00 pm. You do not need an appointment for the interview!  [facility] [address] [place], [place] [phone]   [facility] Health Department  [phone]   [facility] Health Department  [phone]   [facility] Health Department  [phone]    Behavioral Health Resources in the Community: Intensive Outpatient Programs Organization         Address  Phone  Notes  [facility] [address] [place], [place] [phone]   [facility]patient [address], [place], [place] [phone]   [facility]: Alcohol [facility] Drug Svcs  [address] [place], [place]  [phone]   [facility] Mental Health [address],  [place], [place] [phone] or [phone]   Substance Abuse Resources Organization         Address  Phone  Notes  [facility]  [phone]   [facility]  [phone]   [facility]  [phone]   Floydene Flock  [phone]   Residential & Outpatient Substance Abuse Program  [phone]   Psychological Services Organization         Address  Phone  Notes  [facility] Behavioral Health  [phone]   [facility] Services  [phone]   [facility] Health [address] [place] [phone] or [phone]    Mobile Crisis Teams Organization         Address  Phone  Notes  [facility], Mobile Crisis Care Unit  [phone]   [facility]  [address] [place], [place] [phone]   Doristine Locks [address] [place] [phone]    Self-Help/Support Groups Organization         Address  Phone             Notes  [facility] [place] - variety of support groups  336- I7437963 Call for more information  Narcotics Anonymous (NA), [facility] [address], [employer] [place]  2 meetings at this location   Statistician         Address  Phone  Notes  [facility] [address],    [place]  [phone]   [facility]  [address] [place] [address], [place], [place] [phone]   [facility] [address] [place], [place] [phone] Admissions: 8am-3pm M-F  [facility] [address],    [place], [place] [phone]   [facility] [address] [place], [place], [place] [phone]   [facility] [address]  [place], [place] [phone]   [facility] - Intensive Outpatient [address], [place], [place]  [phone]   [facility] ([facility] [address]  [place], [place] [phone] or [phone]   [facility] ([facility]) [address], [place], [place] [phone] Accepts Medicaid  [facility] [address],  [place] [phone] Substance Abuse/Addiction Treatment   [facility] Organization         Address  Phone  Notes  [facility]  [phone]   Angie Fava, PhD [address] Kn[address] [place], [place]   [phone] or [phone]   [facility] Behavioral   [address] [place], [place] [phone]   [facility] [address], [place], [place] [phone] Insurance/Medicaid/sponsorship through [facility] [address]                                    [place], [place] [phone] Therapy/tele-psych/case  [facility] [address], [place] [phone]    Dr. Lolly Mustache  [phone]   [facility]  [employer] [facility] Dept. 1) [address], [place] 2) [address] [place] [address], [place] 3)  [address], [place] [phone]  [phone]   [facility] Child Abuse Hotline 813-705-4819 or [phone] (After Hours)

## 2014-04-09 NOTE — ED Notes (Signed)
Patient transported to X-ray 

## 2014-04-25 ENCOUNTER — Ambulatory Visit (HOSPITAL_COMMUNITY): Payer: Self-pay | Admitting: Psychiatry

## 2014-04-26 ENCOUNTER — Telehealth (HOSPITAL_COMMUNITY): Payer: Self-pay | Admitting: *Deleted

## 2014-04-26 NOTE — Telephone Encounter (Signed)
Medications were both written for 30 days on 03/28/14.  Supply adequate until appointment 04/27/14.

## 2014-04-26 NOTE — Telephone Encounter (Signed)
Terri White,   Patient called to reschedule appointment, appointment was cancelled yesterday due to weather. Patient is following up tomorrow at 3:30 pm. Patient states that she is completely out of all her medication, Klonopin and Adderall.

## 2014-04-27 ENCOUNTER — Ambulatory Visit (INDEPENDENT_AMBULATORY_CARE_PROVIDER_SITE_OTHER): Payer: Federal, State, Local not specified - Other | Admitting: Psychiatry

## 2014-04-27 ENCOUNTER — Encounter (HOSPITAL_COMMUNITY): Payer: Self-pay | Admitting: Psychiatry

## 2014-04-27 VITALS — Wt 145.0 lb

## 2014-04-27 DIAGNOSIS — F988 Other specified behavioral and emotional disorders with onset usually occurring in childhood and adolescence: Secondary | ICD-10-CM

## 2014-04-27 DIAGNOSIS — F909 Attention-deficit hyperactivity disorder, unspecified type: Secondary | ICD-10-CM

## 2014-04-27 DIAGNOSIS — F411 Generalized anxiety disorder: Secondary | ICD-10-CM

## 2014-04-27 MED ORDER — AMPHETAMINE-DEXTROAMPHETAMINE 15 MG PO TABS: 30.0000 mg | ORAL_TABLET | Freq: Every day | ORAL | Status: DC

## 2014-04-27 MED ORDER — CLONAZEPAM 0.5 MG PO TABS: 0.5000 mg | ORAL_TABLET | Freq: Two times a day (BID) | ORAL | Status: DC

## 2014-04-27 NOTE — Progress Notes (Signed)
[facility] [id number] Progress Note  Terri White [id number] 49 y.o.  04/27/2014 3:46 PM  Chief Complaint:  I like Adderall better than Vyvanse.  I am more focused and attentive.  I can do multitasking.  History of Present Illness:  Terri Landngela came for her followup appointment.  On her last visit we discontinued Vyvanse and tried Adderall.  Patient is doing much better with Adderall.  She is able to do multitasking.  She denies any side effects.  Her appetite is okay.  Recently she was seen in the emergency room because of bronchitis and upper respiratory infection.  She is taking antibiotic.  Her anxiety is under control with Klonopin 0.5 mg twice a day.  Patient does not ask for early refill.  Her energy level is good.  She is more social and active.  Patient denies drinking or using any illegal substances .  She has no tremors, shakes or any side effects.  Her appetite is okay.  Her vitals are stable.  Patient lives with her children.  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  Past psychiatric history. Patient has diagnosed with ADD in her school age.  She has a formal psychological testing by Dr. Evelene CroonKaur.  She took Ritalin and Adderall in the past in her school years.  She was getting Vyvanse in this office until recently it stopped working at it was switched back to Adderall .  Patient denies any history of mania, psychosis, hallucination, agitation or aggressive behavior.  Patient denies any history of suicidal attempt or any inpatient psychiatric treatment.  Patient denies any history of ECT treatment.    Medical history Patient has arthritis.  Her primary care physician is Dr. Abigail Miyamotohacker but she usually goes to the emergency room for her basic physical needs.    Suicidal Ideation: No Plan Formed: No Patient has means to carry out plan: No  Homicidal Ideation: No Plan Formed: No Patient has means to carry out plan: No  ROS   Psychiatric: Agitation:  No Hallucination: No Depressed Mood: No Insomnia: No Hypersomnia: No Altered Concentration: No Feels Worthless: No Grandiose Ideas: No Belief In Special Powers: No New/Increased Substance Abuse: No Compulsions: No  Neurologic: Headache: No Seizure: No Paresthesias: No  Medical History;  See above  Psychosocial History: See above  Outpatient Encounter Prescriptions as of 04/27/2014  Medication Sig  . amoxicillin-clavulanate (AUGMENTIN) 875-125 MG per tablet Take 1 tablet by mouth every 12 (twelve) hours.  [name]. amphetamine-dextroamphetamine (ADDERALL) 15 MG tablet Take 2 tablets by mouth daily.  . benzonatate (TESSALON) 100 MG capsule Take 1 capsule (100 mg total) by mouth every 8 (eight) hours.  . clonazePAM (KLONOPIN) 0.5 MG tablet Take 1 tablet (0.5 mg total) by mouth 2 (two) times daily.  . Multiple Vitamin (MULTIVITAMIN WITH MINERALS) TABS tablet Take 1 tablet by mouth daily.  . [DISCONTINUED] amphetamine-dextroamphetamine (ADDERALL) 15 MG tablet Take 2 tablets by mouth daily.  . [DISCONTINUED] amphetamine-dextroamphetamine (ADDERALL) 15 MG tablet Take 2 tablets by mouth daily.  . [DISCONTINUED] clonazePAM (KLONOPIN) 0.5 MG tablet Take 1 tablet (0.5 mg total) by mouth 2 (two) times daily.    No results found for this or any previous visit (from the past 2160 hour(s)).  Physical Exam: Constitutional:  Wt 145 lb (65.772 kg)  LMP 02/27/2014  Musculoskeletal: Strength & Muscle Tone: within normal limits Gait & Station: normal Patient leans: Patient has a normal posture  Mental Status Examination;  Patient is well groomed and well dressed female.  She maintains  good eye contact.  Her speech is clear and coherent.  She describes her mood euthymic and her affect is appropriate. She denies any active or passive suicidal thoughts or homicidal thoughts.  She denies any auditory or visual hallucination.  Her attention and concentration is good.  There were no paranoia or  delusions present at this time.  There are no flight of ideas or any loose association.  Her fund of knowledge is adequate.  There are no tremors or shakes present.  She is alert and oriented x3.  Her insight judgment and impulse control disorder.   Established Problem, Stable/Improving (1), Review of Last Therapy Session (1) and Review of Medication Regimen & Side Effects (2)  Assessment: Axis I: Attention deficit disorder  Axis II: Deferred  Axis III:  Past Medical History  Diagnosis Date  . Arthritis   . Back pain   . Anxiety   . Nerve damage   . ADD (attention deficit disorder)    Plan:  Patient is doing better on her current psychotropic medication.  I will continue Adderall extended release 50 mg 2 tablet daily and Klonopin 0.5 mg twice a day.  Discussed medication side effects and benefits.  Recommended to call us back if she has any question or any concern.  Follow-up in 2 months.  [name] T., MD 04/27/2014

## 2014-04-30 ENCOUNTER — Encounter (HOSPITAL_COMMUNITY): Payer: Self-pay

## 2014-04-30 ENCOUNTER — Emergency Department (HOSPITAL_COMMUNITY): Payer: Medicaid Other

## 2014-04-30 ENCOUNTER — Emergency Department (HOSPITAL_COMMUNITY)
Admission: EM | Admit: 2014-04-30 | Discharge: 2014-04-30 | Disposition: A | Discharge status: Home / Self Care | Payer: Medicaid Other | Attending: Emergency Medicine | Admitting: Emergency Medicine

## 2014-04-30 DIAGNOSIS — Y9289 Other specified places as the place of occurrence of the external cause: Secondary | ICD-10-CM | POA: Insufficient documentation

## 2014-04-30 DIAGNOSIS — X58XXXA Exposure to other specified factors, initial encounter: Secondary | ICD-10-CM | POA: Insufficient documentation

## 2014-04-30 DIAGNOSIS — S99921A Unspecified injury of right foot, initial encounter: Secondary | ICD-10-CM | POA: Diagnosis present

## 2014-04-30 DIAGNOSIS — Y9301 Activity, walking, marching and hiking: Secondary | ICD-10-CM | POA: Diagnosis not present

## 2014-04-30 DIAGNOSIS — S9031XA Contusion of right foot, initial encounter: Secondary | ICD-10-CM | POA: Diagnosis not present

## 2014-04-30 DIAGNOSIS — F419 Anxiety disorder, unspecified: Secondary | ICD-10-CM | POA: Diagnosis not present

## 2014-04-30 DIAGNOSIS — Z792 Long term (current) use of antibiotics: Secondary | ICD-10-CM | POA: Insufficient documentation

## 2014-04-30 DIAGNOSIS — F909 Attention-deficit hyperactivity disorder, unspecified type: Secondary | ICD-10-CM | POA: Insufficient documentation

## 2014-04-30 DIAGNOSIS — Y998 Other external cause status: Secondary | ICD-10-CM | POA: Insufficient documentation

## 2014-04-30 DIAGNOSIS — Z720 Tobacco use: Secondary | ICD-10-CM | POA: Insufficient documentation

## 2014-04-30 DIAGNOSIS — Z79899 Other long term (current) drug therapy: Secondary | ICD-10-CM | POA: Insufficient documentation

## 2014-04-30 DIAGNOSIS — T1490XA Injury, unspecified, initial encounter: Secondary | ICD-10-CM

## 2014-04-30 DIAGNOSIS — Z8739 Personal history of other diseases of the musculoskeletal system and connective tissue: Secondary | ICD-10-CM | POA: Insufficient documentation

## 2014-04-30 NOTE — ED Notes (Signed)
Bed: [id number] Expected date: 04/30/14 Expected time: 9:07 AM Means of arrival: Ambulance Comments: Leg Pain

## 2014-04-30 NOTE — ED Notes (Signed)
Post op boot applied to RT foot.

## 2014-04-30 NOTE — ED Provider Notes (Signed)
CSN: [id number]     Arrival date & time 04/30/14  0911 History   First MD Initiated Contact with Patient 04/30/14 0912     Chief Complaint  Patient presents with  . Foot Pain     (Consider location/radiation/quality/duration/timing/severity/associated sxs/prior Treatment) HPI Comments: Pt here with right foot pain that started after she twisted it while walking downstairs, no bleeding noted----pain is sharp and worse with standing or movement--denies knee or hip pain--no numbness to her foot--sx persistent and pt called ems for transportation  Patient is a 49 y.o. female presenting with lower extremity pain. The history is provided by the patient.  Foot Pain    Past Medical History  Diagnosis Date  . Arthritis   . Back pain   . Anxiety   . Nerve damage   . ADD (attention deficit disorder)    Past Surgical History  Procedure Laterality Date  . Cesarean section    . Knee surgery    . Knee surgery      R knee  . Cesarean section  2002 and 2003   Family History  Problem Relation Age of Onset  . Osteoarthritis Mother   . Rheum arthritis Father   . ADD / ADHD Sister    History  Substance Use Topics  . Smoking status: Current Some Day Smoker -- 0.30 packs/day for 10 years    Types: Cigarettes  . Smokeless tobacco: Never Used  . Alcohol Use: No   OB History    Gravida Para Term Preterm AB TAB SAB Ectopic Multiple Living            2     Review of Systems  All other systems reviewed and are negative.     Allergies  Diclofenac sodium; Pyridium; Sulfa antibiotics; Toradol; and Tramadol  Home Medications   Prior to Admission medications   Medication Sig Start Date End Date Taking? Authorizing Provider  amoxicillin-clavulanate (AUGMENTIN) 875-125 MG per tablet Take 1 tablet by mouth every 12 (twelve) hours. 04/09/14   Antony Madura, PA-C  amphetamine-dextroamphetamine (ADDERALL) 15 MG tablet Take 2 tablets by mouth daily. 04/27/14 04/27/15  Cleotis Nipper, MD    benzonatate (TESSALON) 100 MG capsule Take 1 capsule (100 mg total) by mouth every 8 (eight) hours. 04/09/14   Antony Madura, PA-C  clonazePAM (KLONOPIN) 0.5 MG tablet Take 1 tablet (0.5 mg total) by mouth 2 (two) times daily. 04/27/14   Cleotis Nipper, MD  Multiple Vitamin (MULTIVITAMIN WITH MINERALS) TABS tablet Take 1 tablet by mouth daily.    Historical Provider, MD   BP 125/69 mmHg  Pulse 96  Temp(Src) 98.3 F (36.8 C) (Oral)  Resp 17  SpO2 100%  LMP 04/09/2014 (Approximate) Physical Exam  Constitutional: She is oriented to person, place, and time. She appears well-developed and well-nourished.  Non-toxic appearance. No distress.  HENT:  Head: Normocephalic and atraumatic.  Eyes: Conjunctivae, EOM and lids are normal. Pupils are equal, round, and reactive to light.  Neck: Normal range of motion. Neck supple. No tracheal deviation present. No thyroid mass present.  Cardiovascular: Normal rate, regular rhythm and normal heart sounds.  Exam reveals no gallop.   No murmur heard. Pulmonary/Chest: Effort normal and breath sounds normal. No stridor. No respiratory distress. She has no decreased breath sounds. She has no wheezes. She has no rhonchi. She has no rales.  Abdominal: Soft. Normal appearance and bowel sounds are normal. She exhibits no distension. There is no tenderness. There is no rebound and no  CVA tenderness.  Musculoskeletal: Normal range of motion. She exhibits no edema or tenderness.       Feet:  Neurological: She is alert and oriented to person, place, and time. She has normal strength. No cranial nerve deficit or sensory deficit. GCS eye subscore is 4. GCS verbal subscore is 5. GCS motor subscore is 6.  Skin: Skin is warm and dry. No abrasion and no rash noted.  Psychiatric: She has a normal mood and affect. Her speech is normal and behavior is normal.  Nursing note and vitals reviewed.   ED Course  Procedures (including critical care time) Labs Review Labs Reviewed -  No data to display  Imaging Review No results found.   EKG Interpretation None      MDM   Final diagnoses:  Trauma   Pts x-ray negative--crutches and post op shoe given    Toy BakerAnthony T Glenette Bookwalter, MD 04/30/14 1102

## 2014-04-30 NOTE — Discharge Instructions (Signed)
Contusion °A contusion is a deep bruise. Contusions are the result of an injury that caused bleeding under the skin. The contusion may turn blue, purple, or yellow. Minor injuries will give you a painless contusion, but more severe contusions may stay painful and swollen for a few weeks.  °CAUSES  °A contusion is usually caused by a blow, trauma, or direct force to an area of the body. °SYMPTOMS  °· Swelling and redness of the injured area. °· Bruising of the injured area. °· Tenderness and soreness of the injured area. °· Pain. °DIAGNOSIS  °The diagnosis can be made by taking a history and physical exam. An X-ray, CT scan, or MRI may be needed to determine if there were any associated injuries, such as fractures. °TREATMENT  °Specific treatment will depend on what area of the body was injured. In general, the best treatment for a contusion is resting, icing, elevating, and applying cold compresses to the injured area. Over-the-counter medicines may also be recommended for pain control. Ask your caregiver what the best treatment is for your contusion. °HOME CARE INSTRUCTIONS  °· Put ice on the injured area. °¨ Put ice in a plastic bag. °¨ Place a towel between your skin and the bag. °¨ Leave the ice on for 15-20 minutes, 3-4 times a day, or as directed by your health care provider. °· Only take over-the-counter or prescription medicines for pain, discomfort, or fever as directed by your caregiver. Your caregiver may recommend avoiding anti-inflammatory medicines (aspirin, ibuprofen, and naproxen) for 48 hours because these medicines may increase bruising. °· Rest the injured area. °· If possible, elevate the injured area to reduce swelling. °SEEK IMMEDIATE MEDICAL CARE IF:  °· You have increased bruising or swelling. °· You have pain that is getting worse. °· Your swelling or pain is not relieved with medicines. °MAKE SURE YOU:  °· Understand these instructions. °· Will watch your condition. °· Will get help right  away if you are not doing well or get worse. °Document Released: 12/05/2004 Document Revised: 03/02/2013 Document Reviewed: 12/31/2010 °ExitCare® Patient Information ©2015 [employer]. This information is not intended to replace advice given to you by your health care provider. Make sure you discuss any questions you have with your health care provider. ° °

## 2014-04-30 NOTE — ED Notes (Signed)
Patient c/o severe right foot pain that she noticed when she woke up.  Patient states she may have "twisted" foot during night when she got up to go to bathroom.  Patient has some bruising to medial right foot, no edema noted.  Pulses palpated, cap refill RLE <3 seconds.  Skin warm and dry.

## 2014-04-30 NOTE — ED Notes (Signed)
She noted (non-traumatic) right foot pain yesterday evening--much worse this morning--she is crying in pain and it is very tender to touch.  C/o pain at plantar aspect of right foot.  Her daughter is with her.

## 2014-05-20 ENCOUNTER — Telehealth (HOSPITAL_COMMUNITY): Payer: Self-pay | Admitting: *Deleted

## 2014-05-20 NOTE — Telephone Encounter (Signed)
Dr. Lolly MustacheArfeen,   Patient stated that she left two messages yesterday(05-19-14) but not sure if it was on nurse's voice mail or up front mail box.  Patient stated that she is going out of town Sunday for work will not be back until April.  Patient stated that she needs you to call the pharmacy and give clearance for her medication to be refilled early before 05-26-14.  Patient stated that she explained this last office visit.

## 2014-05-23 NOTE — Telephone Encounter (Signed)
Call return.  Her phone number is not working.

## 2014-05-24 ENCOUNTER — Telehealth (HOSPITAL_COMMUNITY): Payer: Self-pay

## 2014-05-24 NOTE — Telephone Encounter (Signed)
Telephone call with patient to follow up on her request for Dr. Lolly MustacheArfeen to authorize her filling her Adderall early this month due to plans to go out of town for work.  [name] reported she is leaving at 6:30am on 05/26/14, the date medications can be filled but would like to get them filled today in preparation for her her trip and also as she is on stand by for an earlier flight.  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  Patient is requesting Dr. Lolly MustacheArfeen allow [facility] her medications later this date or no later than tomorrow the 16th, one day earlier than scheduled.

## 2014-05-24 NOTE — Telephone Encounter (Signed)
Will ok to refill adderall 2 day early as she is going out of town.

## 2014-06-21 ENCOUNTER — Other Ambulatory Visit (HOSPITAL_COMMUNITY): Payer: Self-pay | Admitting: Psychiatry

## 2014-06-21 ENCOUNTER — Telehealth (HOSPITAL_COMMUNITY): Payer: Self-pay | Admitting: *Deleted

## 2014-06-21 NOTE — Telephone Encounter (Signed)
Patient walked in to pick up RX's. None advised patient to come and pick up RX's.  Per Dr. Lolly MustacheArfeen no early refills. Patient will be due for refill on 05-26-14.  Patient got upset and left.  Attempted to reach patient.

## 2014-06-21 NOTE — Telephone Encounter (Signed)
Called patient to reschedule appointment for Monday 06-27-14.  Patient stated that she need a refill on Klonopin and Adderall. Patient rescheduled appointment for 07-04-14.  Patient will need refill on medications 06-26-14. Do you want to refill?

## 2014-06-23 ENCOUNTER — Telehealth (HOSPITAL_COMMUNITY): Payer: Self-pay

## 2014-06-23 ENCOUNTER — Other Ambulatory Visit (HOSPITAL_COMMUNITY): Payer: Self-pay | Admitting: Psychiatry

## 2014-06-23 DIAGNOSIS — F988 Other specified behavioral and emotional disorders with onset usually occurring in childhood and adolescence: Secondary | ICD-10-CM

## 2014-06-23 DIAGNOSIS — F411 Generalized anxiety disorder: Secondary | ICD-10-CM

## 2014-06-23 MED ORDER — CLONAZEPAM 0.5 MG PO TABS: 0.5000 mg | ORAL_TABLET | Freq: Two times a day (BID) | ORAL | Status: DC

## 2014-06-23 MED ORDER — AMPHETAMINE-DEXTROAMPHETAMINE 15 MG PO TABS: 30.0000 mg | ORAL_TABLET | Freq: Every day | ORAL | Status: DC

## 2014-06-23 NOTE — Telephone Encounter (Signed)
Telephone call from patient stating she was running out of her Klonopin and Adderall medication and would not have enough to last until rescheduled appointment on 07/04/14.  Patient reported she needed the new orders prior to Monday 06/27/14 as reports last had medications filled 05/25/14.  Sp[facility] with Mar[facility]Sarah at [facility] to verify last date orders filled were 04/1714 and then 05/25/14 for both medications.  Patient was allowed to fill medication one day early the previous month due to going out of town then to [place].  Patient reports needs to be able to pick up new prescriptions on 06/24/14 to then fill again on 06/25/14 and requests orders be approved by Dr. Lolly MustacheArfeen.

## 2014-06-23 NOTE — Telephone Encounter (Signed)
Met with Dr. Adele Schilder to question if patient's Adderall and Klonopin orders could be prepared for patient to pick up.  Dr. Adele Schilder authorized one refill of both medications with patient returning for evaluation on 07/04/14.  Dr. Adele Schilder requested both prescriptions state "No early refills. Please check National Registry for Duplicate Refills" to help ensure patient receiving orders on time. Called patient to inform prescriptions would be ready for her to pick up on 06/24/14.

## 2014-06-24 ENCOUNTER — Telehealth (HOSPITAL_COMMUNITY): Payer: Self-pay

## 2014-06-24 NOTE — Telephone Encounter (Signed)
Mediation refill - patient picked up new prescription 06/24/14 to be filled on 06/25/14 so refill request already addressed.  Denied with patient's pharmacy.

## 2014-06-24 NOTE — Telephone Encounter (Signed)
Terri White picked up prescription on 06/24/14  Barling [id number]  dlo

## 2014-06-27 ENCOUNTER — Ambulatory Visit (HOSPITAL_COMMUNITY): Payer: Self-pay | Admitting: Psychiatry

## 2014-07-04 ENCOUNTER — Encounter (HOSPITAL_COMMUNITY): Payer: Self-pay | Admitting: Psychiatry

## 2014-07-04 NOTE — Progress Notes (Signed)
No show today.   This encounter was created in error - please disregard. This encounter was created in error - please disregard.

## 2014-07-07 ENCOUNTER — Ambulatory Visit (INDEPENDENT_AMBULATORY_CARE_PROVIDER_SITE_OTHER): Payer: Federal, State, Local not specified - Other | Admitting: Psychiatry

## 2014-07-07 ENCOUNTER — Encounter (HOSPITAL_COMMUNITY): Payer: Self-pay | Admitting: Psychiatry

## 2014-07-07 VITALS — BP 117/70 | HR 93 | Ht 62.0 in | Wt 145.2 lb

## 2014-07-07 DIAGNOSIS — F411 Generalized anxiety disorder: Secondary | ICD-10-CM

## 2014-07-07 DIAGNOSIS — Z79899 Other long term (current) drug therapy: Secondary | ICD-10-CM

## 2014-07-07 DIAGNOSIS — F909 Attention-deficit hyperactivity disorder, unspecified type: Secondary | ICD-10-CM | POA: Diagnosis not present

## 2014-07-07 DIAGNOSIS — F988 Other specified behavioral and emotional disorders with onset usually occurring in childhood and adolescence: Secondary | ICD-10-CM

## 2014-07-07 MED ORDER — CLONAZEPAM 0.5 MG PO TABS: 0.5000 mg | ORAL_TABLET | Freq: Two times a day (BID) | ORAL | Status: DC

## 2014-07-07 MED ORDER — AMPHETAMINE-DEXTROAMPHETAMINE 15 MG PO TABS: 30.0000 mg | ORAL_TABLET | Freq: Every day | ORAL | Status: DC

## 2014-07-07 NOTE — Progress Notes (Signed)
[facility] [id number] Progress Note  Terri White [id number] 49 y.o.  07/07/2014 10:25 AM  Chief Complaint:  Medication management and follow-up.    History of Present Illness:  Terri White came for her followup appointment.  She is taking Adderall 15 mg twice a day and Klonopin 0.5 mg twice a day.  Her attention and focus is good.  She denies any major panic attack.  Her appetite is okay.  She has mild tachycardia but she denies any chest pain, dizziness or any other associated symptoms.  Her energy level is good.  She went to [place] on his spring break and she had a good time.  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  Patient is sleeping good.  She denies any irritability, anger, mood swings.  She denies drinking or using any illegal substances.  Patient lives with her children.  She was seen in the emergency room in February due to foot pain but now she is feeling better.  Past psychiatric history. Patient has ADD since school age.  She has formal psychological testing by Dr. Evelene White.  She took Ritalin, Adderall and recently Vyvanse but stopped working.  Patient denies any history of mania, psychosis, hallucination, agitation or aggressive behavior.  Patient denies any history of suicidal attempt or any inpatient psychiatric treatment.  Patient denies any history of ECT treatment.    Medical history Patient has arthritis.  Her primary care physician is Dr. Abigail White but she usually goes to the emergency room for her basic physical needs.    Suicidal Ideation: No Plan Formed: No Patient has means to carry out plan: No  Homicidal Ideation: No Plan Formed: No Patient has means to carry out plan: No  Review of Systems  Constitutional: Negative.   Cardiovascular: Negative for chest pain and palpitations.  Skin: Negative.   Neurological: Negative for dizziness and tremors.   Psychiatric/Behavioral: Negative for suicidal ideas and substance abuse.     Psychiatric: Agitation: No Hallucination: No Depressed Mood: No Insomnia: No Hypersomnia: No Altered Concentration: No Feels Worthless: No Grandiose Ideas: No Belief In Special Powers: No New/Increased Substance Abuse: No Compulsions: No  Neurologic: Headache: No Seizure: No Paresthesias: No  Medical History;  See above  Psychosocial History: See above  Outpatient Encounter Prescriptions as of 07/07/2014  Medication Sig  . amphetamine-dextroamphetamine (ADDERALL) 15 MG tablet Take 2 tablets by mouth daily.  . clonazePAM (KLONOPIN) 0.5 MG tablet Take 1 tablet (0.5 mg total) by mouth 2 (two) times daily.  . Multiple Vitamin (MULTIVITAMIN WITH MINERALS) TABS tablet Take 1 tablet by mouth daily.  . [DISCONTINUED] amoxicillin-clavulanate (AUGMENTIN) 875-125 MG per tablet Take 1 tablet by mouth every 12 (twelve) hours. (Patient not taking: Reported on 04/30/2014)  . [DISCONTINUED] amphetamine-dextroamphetamine (ADDERALL) 15 MG tablet Take 2 tablets by mouth daily.  . [DISCONTINUED] amphetamine-dextroamphetamine (ADDERALL) 15 MG tablet Take 2 tablets by mouth daily.  . [DISCONTINUED] benzonatate (TESSALON) 100 MG capsule Take 1 capsule (100 mg total) by mouth every 8 (eight) hours. (Patient not taking: Reported on 04/30/2014)  . [DISCONTINUED] clonazePAM (KLONOPIN) 0.5 MG tablet Take 1 tablet (0.5 mg total) by mouth 2 (two) times daily.    No results found for this or any previous visit (from the past 2160 hour(s)).  Physical Exam: Constitutional:  BP 117/70 mmHg  Pulse 93  Ht  (1.575 m)  Wt 145 lb 3.2 oz (65.862  kg)  BMI 26.55 kg/m2  Musculoskeletal: Strength & Muscle Tone: within normal limits Gait & Station: normal Patient leans: Patient has a normal posture  Mental Status Examination;  Patient is well groomed and well dressed female.  She maintains  good eye contact.  Her speech is  clear and coherent.  She describes her mood euthymic and her affect is appropriate. She denies any active or passive suicidal thoughts or homicidal thoughts.  She denies any auditory or visual hallucination.  Her attention and concentration is good.  There were no paranoia or delusions present at this time.  There are no flight of ideas or any loose association.  Her fund of knowledge is adequate.  There are no tremors or shakes present.  She is alert and oriented x3.  Her insight judgment and impulse control disorder.   Established Problem, Stable/Improving (1), Review of Psycho-Social Stressors (1), Review or order clinical lab tests (1), Review of Last Therapy Session (1) and Review of Medication Regimen & Side Effects (2)  Assessment: Axis I: Attention deficit disorder  Axis II: Deferred  Axis III:  Past Medical History  Diagnosis Date  . Arthritis   . Back pain   . Anxiety   . Nerve damage   . ADD (attention deficit disorder)    Plan:  Patient is stable on her current psychotropic medication.  She has no side effects.  She has mild tachycardia but no other symptoms.  Discussed benzodiazepine or stimulant side effects, dependency and withdrawal symptoms.  Continue Adderall X are 15 mg 2 tablet daily and Klonopin 0.5 mg twice a day.  Discussed medication side effects and benefits.  She did not have blood work in a while and we will do CBC, CMP, hemoglobin A1c, TSH and lipid panel.  Recommended to call us back if she has any question or any concern.  Follow-up in 3 months.  Terri Mancillas T., MD 07/07/2014

## 2014-09-15 ENCOUNTER — Emergency Department (HOSPITAL_COMMUNITY)
Admission: EM | Admit: 2014-09-15 | Discharge: 2014-09-16 | Disposition: A | Discharge status: Home / Self Care | Payer: Medicaid Other | Attending: Emergency Medicine | Admitting: Emergency Medicine

## 2014-09-15 ENCOUNTER — Encounter (HOSPITAL_COMMUNITY): Payer: Self-pay | Admitting: Emergency Medicine

## 2014-09-15 DIAGNOSIS — S8992XA Unspecified injury of left lower leg, initial encounter: Secondary | ICD-10-CM | POA: Insufficient documentation

## 2014-09-15 DIAGNOSIS — S8991XA Unspecified injury of right lower leg, initial encounter: Secondary | ICD-10-CM | POA: Insufficient documentation

## 2014-09-15 DIAGNOSIS — Z79899 Other long term (current) drug therapy: Secondary | ICD-10-CM | POA: Diagnosis not present

## 2014-09-15 DIAGNOSIS — F1193 Opioid use, unspecified with withdrawal: Secondary | ICD-10-CM

## 2014-09-15 DIAGNOSIS — S79911A Unspecified injury of right hip, initial encounter: Secondary | ICD-10-CM | POA: Diagnosis not present

## 2014-09-15 DIAGNOSIS — F1123 Opioid dependence with withdrawal: Secondary | ICD-10-CM | POA: Diagnosis not present

## 2014-09-15 DIAGNOSIS — Y998 Other external cause status: Secondary | ICD-10-CM | POA: Diagnosis not present

## 2014-09-15 DIAGNOSIS — Y9259 Other trade areas as the place of occurrence of the external cause: Secondary | ICD-10-CM | POA: Diagnosis not present

## 2014-09-15 DIAGNOSIS — F419 Anxiety disorder, unspecified: Secondary | ICD-10-CM | POA: Insufficient documentation

## 2014-09-15 DIAGNOSIS — W01198A Fall on same level from slipping, tripping and stumbling with subsequent striking against other object, initial encounter: Secondary | ICD-10-CM | POA: Insufficient documentation

## 2014-09-15 DIAGNOSIS — Z8739 Personal history of other diseases of the musculoskeletal system and connective tissue: Secondary | ICD-10-CM | POA: Diagnosis not present

## 2014-09-15 DIAGNOSIS — S79912A Unspecified injury of left hip, initial encounter: Secondary | ICD-10-CM | POA: Insufficient documentation

## 2014-09-15 DIAGNOSIS — F909 Attention-deficit hyperactivity disorder, unspecified type: Secondary | ICD-10-CM | POA: Diagnosis not present

## 2014-09-15 DIAGNOSIS — I82409 Acute embolism and thrombosis of unspecified deep veins of unspecified lower extremity: Secondary | ICD-10-CM | POA: Diagnosis not present

## 2014-09-15 DIAGNOSIS — Z720 Tobacco use: Secondary | ICD-10-CM | POA: Diagnosis not present

## 2014-09-15 DIAGNOSIS — Z299 Encounter for prophylactic measures, unspecified: Secondary | ICD-10-CM

## 2014-09-15 DIAGNOSIS — Y9389 Activity, other specified: Secondary | ICD-10-CM | POA: Diagnosis not present

## 2014-09-15 LAB — I-STAT CHEM 8, ED
BUN: 20 mg/dL (ref 6–20)
CHLORIDE: 106 mmol/L (ref 101–111)
Calcium, Ion: 1.15 mmol/L (ref 1.12–1.23)
Creatinine, Ser: 0.8 mg/dL (ref 0.44–1.00)
Glucose, Bld: 87 mg/dL (ref 65–99)
HCT: 37 % (ref 36.0–46.0)
Hemoglobin: 12.6 g/dL (ref 12.0–15.0)
Potassium: 3.3 mmol/L — ABNORMAL LOW (ref 3.5–5.1)
SODIUM: 144 mmol/L (ref 135–145)
TCO2: 24 mmol/L (ref 0–100)

## 2014-09-15 LAB — D-DIMER, QUANTITATIVE (NOT AT ARMC): D DIMER QUANT: 0.57 ug{FEU}/mL — AB (ref 0.00–0.48)

## 2014-09-15 NOTE — ED Notes (Signed)
[facility] striking her head and the front of both knees.  Pt had Motrin at 4pm today.

## 2014-09-15 NOTE — ED Provider Notes (Signed)
CSN: [id number]     Arrival date & time 09/15/14  2250 History  This chart was scribed for Earley Favor, NP, working with Marisa Severin, MD by Chestine Spore, ED Scribe. The patient was seen in room WTR7/WTR7 at 11:17 PM.     Chief Complaint  Patient presents with  . Fall      The history is provided by the patient. No language interpreter was used.    HPI Comments: Terri White is a 49 y.o. female who presents to the Emergency Department complaining of fall onset 3 days ago. Pt notes that she fell backwards at [employer] on 09/12/2014 hitting her head and the front of both knees. Pt notes that her right elbow is painful to her and it hurts the most in the morning. Pt reports that she is not able to pinpoint any specific area of pain. Pt is out of her suboxone and she is not sure of when she ran out, although she guesses it to be 1 week ago. Pt has an appointment for 10/04/14. 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 Pt has been on suboxone for 4 months. She states that she is having associated symptoms of bilateral hip pain, abdominal pain/cramping, right leg tightness. Pt notes that she just got back from flying to [place] and since then she has had right leg tightness. She states that she has tried ibuprofen at 4 PM with no relief for her symptoms. She denies nausea and any other symptoms.   Past Medical History  Diagnosis Date  . Arthritis   . Back pain   . Anxiety   . Nerve damage   . ADD (attention deficit disorder)    Past Surgical History  Procedure Laterality Date  . Cesarean section    . Knee surgery    . Knee surgery      R knee  . Cesarean section  2002 and 2003   Family History  Problem Relation Age of Onset  . Osteoarthritis Mother   . Rheum arthritis Father   . ADD / ADHD Sister    History  Substance Use Topics  . Smoking status: Current Some Day Smoker -- 0.30 packs/day for 10 years    Types: Cigarettes  . Smokeless tobacco:  Never Used  . Alcohol Use: No   OB History    Gravida Para Term Preterm AB TAB SAB Ectopic Multiple Living            2     Review of Systems  Gastrointestinal: Positive for abdominal pain (cramping). Negative for nausea.  Musculoskeletal: Positive for myalgias (generalized). Negative for joint swelling.  Skin: Negative for color change and wound.      Allergies  Diclofenac sodium; Pyridium; Sulfa antibiotics; Toradol; and Tramadol  Home Medications   Prior to Admission medications   Medication Sig Start Date End Date Taking? Authorizing Provider  amphetamine-dextroamphetamine (ADDERALL) 15 MG tablet Take 2 tablets by mouth daily. 07/07/14 07/07/15  Cleotis Nipper, MD  clonazePAM (KLONOPIN) 0.5 MG tablet Take 1 tablet (0.5 mg total) by mouth 2 (two) times daily. 07/07/14   Cleotis Nipper, MD  Multiple Vitamin (MULTIVITAMIN WITH MINERALS) TABS tablet Take 1 tablet by mouth daily.    Historical Provider, MD   BP 104/51 mmHg  Pulse 100  Temp(Src) 98.1 F (36.7 C) (Oral)  Resp 15  SpO2 96%  LMP 09/03/2014 Physical Exam  Constitutional: She is oriented to person, place, and  time. She appears well-developed and well-nourished. No distress.  HENT:  Head: Normocephalic and atraumatic.  Eyes: EOM are normal.  Neck: Neck supple. No tracheal deviation present.  Cardiovascular: Normal rate.   Pulmonary/Chest: Effort normal. No respiratory distress.  Musculoskeletal: Normal range of motion.  Full ROM. No bony deformity, swelling or discoloration of right knee.  Neurological: She is alert and oriented to person, place, and time.  Skin: Skin is warm and dry.  Psychiatric: She has a normal mood and affect. Her behavior is normal.  Nursing note and vitals reviewed.   ED Course  Procedures (including critical care time) DIAGNOSTIC STUDIES: Oxygen Saturation is 96% on RA, nl by my interpretation.    COORDINATION OF CARE: 11:27 PM-Discussed treatment plan with pt at bedside and pt  agreed to plan.   Labs Review Labs Reviewed  D-DIMER, QUANTITATIVE (NOT AT [facility]) - Abnormal; Notable for the following:    D-Dimer, Quant 0.57 (*)    All other components within normal limits  I-STAT CHEM 8, ED - Abnormal; Notable for the following:    Potassium 3.3 (*)    All other components within normal limits    Imaging Review No results found.   EKG Interpretation None     D-dimer is positive she will be given an injection of Lovenox 1 mg/kg and scheduled for ultrasound follow-up in the morning MDM   Final diagnoses:  DVT prophylaxis  Opiate withdrawal    I personally performed the services described in this documentation, which was scribed in my presence. The recorded information has been reviewed and is accurate.   Earley FavorGail Rathana Viveros, NP 09/16/14 [phone]  Arby BarretteMarcy Pfeiffer, MD 09/19/14 [phone]

## 2014-09-15 NOTE — ED Notes (Signed)
NP at bedside.

## 2014-09-15 NOTE — ED Notes (Signed)
Pt also states that she is out of Suboxone and she has an appt with Pain Management on 10/04/14 ([address]-Dr. Tollie EthPlummer)

## 2014-09-16 MED ORDER — ENOXAPARIN SODIUM 80 MG/0.8ML ~~LOC~~ SOLN
66.0000 mg | Freq: Once | SUBCUTANEOUS | Status: AC
  Administered 2014-09-16: 65 mg via SUBCUTANEOUS
  Filled 2014-09-16: qty 0.8

## 2014-09-16 NOTE — ED Notes (Signed)
Reviewed discharge instructions with patient. Reminded patient of importance of arriving at [facility] admitting at 0800 for imaging. Patient states "what if I can't go". Patient was advised she will need to call the vascular imaging department if she is unable to make her appt so that they may reschedule her but that it was strongly advised and very important for her to arrive for her 8am appt in the morning.

## 2014-09-16 NOTE — Discharge Instructions (Signed)
The test to assess for blood clot is positive, you been scheduled for an ultrasound in the morning.  Please return to [facility] department tonight.  You have been given a dose of Lovenox, which is a very potent medication that will prevent further growth of any clot that may be present.  If your study is positive he will be sent back to the emergency department for further treatment.  If it is negative, he will be discharged home.  Please make sure to keep your appointment with your pain clinic as scheduled on the 26th

## 2014-09-20 ENCOUNTER — Telehealth (HOSPITAL_COMMUNITY): Payer: Self-pay

## 2014-09-20 DIAGNOSIS — F988 Other specified behavioral and emotional disorders with onset usually occurring in childhood and adolescence: Secondary | ICD-10-CM

## 2014-09-20 NOTE — Telephone Encounter (Signed)
Patient left a message requesting a refill of her prescribed Adderall which could be filled last on or after 08/25/14.  Patient returns to see Dr. Lolly MustacheArfeen on 10/06/94.

## 2014-09-21 NOTE — Telephone Encounter (Signed)
Agree with refill of Adderall

## 2014-09-22 MED ORDER — AMPHETAMINE-DEXTROAMPHETAMINE 15 MG PO TABS: 30.0000 mg | ORAL_TABLET | Freq: Every day | ORAL | Status: DC

## 2014-09-22 NOTE — Telephone Encounter (Signed)
Dr. Ladona Ridgelaylor approved a one time refill of patient's prescribed Adderall and signed new order.  Called patient to inform prescription ready for pick up and will see at evaluation on 10/06/14.

## 2014-09-22 NOTE — Addendum Note (Signed)
Addended by: Wilder GladeAYLOR, Thomasina Housley V on: 09/22/2014 09:14 AM   Modules accepted: Orders

## 2014-09-23 ENCOUNTER — Telehealth (HOSPITAL_COMMUNITY): Payer: Self-pay

## 2014-09-23 ENCOUNTER — Encounter (HOSPITAL_COMMUNITY): Payer: Self-pay | Admitting: Emergency Medicine

## 2014-09-23 ENCOUNTER — Emergency Department (HOSPITAL_COMMUNITY)
Admission: EM | Admit: 2014-09-23 | Discharge: 2014-09-23 | Disposition: A | Discharge status: Home / Self Care | Payer: Medicaid Other | Attending: Emergency Medicine | Admitting: Emergency Medicine

## 2014-09-23 ENCOUNTER — Emergency Department (HOSPITAL_BASED_OUTPATIENT_CLINIC_OR_DEPARTMENT_OTHER)
Admit: 2014-09-23 | Discharge: 2014-09-23 | Disposition: A | Discharge status: Home / Self Care | Payer: Medicaid Other | Attending: Emergency Medicine | Admitting: Emergency Medicine

## 2014-09-23 ENCOUNTER — Other Ambulatory Visit: Payer: Self-pay

## 2014-09-23 ENCOUNTER — Emergency Department (HOSPITAL_COMMUNITY): Payer: Medicaid Other

## 2014-09-23 DIAGNOSIS — Z87828 Personal history of other (healed) physical injury and trauma: Secondary | ICD-10-CM | POA: Insufficient documentation

## 2014-09-23 DIAGNOSIS — Z720 Tobacco use: Secondary | ICD-10-CM | POA: Diagnosis not present

## 2014-09-23 DIAGNOSIS — R002 Palpitations: Secondary | ICD-10-CM | POA: Diagnosis not present

## 2014-09-23 DIAGNOSIS — E041 Nontoxic single thyroid nodule: Secondary | ICD-10-CM | POA: Diagnosis not present

## 2014-09-23 DIAGNOSIS — R252 Cramp and spasm: Secondary | ICD-10-CM

## 2014-09-23 DIAGNOSIS — M79609 Pain in unspecified limb: Secondary | ICD-10-CM | POA: Diagnosis not present

## 2014-09-23 DIAGNOSIS — M79661 Pain in right lower leg: Secondary | ICD-10-CM | POA: Diagnosis present

## 2014-09-23 DIAGNOSIS — F909 Attention-deficit hyperactivity disorder, unspecified type: Secondary | ICD-10-CM | POA: Insufficient documentation

## 2014-09-23 DIAGNOSIS — M62831 Muscle spasm of calf: Secondary | ICD-10-CM | POA: Diagnosis not present

## 2014-09-23 DIAGNOSIS — F419 Anxiety disorder, unspecified: Secondary | ICD-10-CM | POA: Diagnosis not present

## 2014-09-23 DIAGNOSIS — Z79899 Other long term (current) drug therapy: Secondary | ICD-10-CM | POA: Diagnosis not present

## 2014-09-23 LAB — I-STAT CHEM 8, ED
BUN: 7 mg/dL (ref 6–20)
Calcium, Ion: 1.22 mmol/L (ref 1.12–1.23)
Chloride: 101 mmol/L (ref 101–111)
Creatinine, Ser: 0.7 mg/dL (ref 0.44–1.00)
Glucose, Bld: 98 mg/dL (ref 65–99)
HCT: 38 % (ref 36.0–46.0)
Hemoglobin: 12.9 g/dL (ref 12.0–15.0)
POTASSIUM: 3.6 mmol/L (ref 3.5–5.1)
Sodium: 142 mmol/L (ref 135–145)
TCO2: 28 mmol/L (ref 0–100)

## 2014-09-23 LAB — I-STAT TROPONIN, ED: Troponin i, poc: 0 ng/mL (ref 0.00–0.08)

## 2014-09-23 MED ORDER — HYDROCODONE-ACETAMINOPHEN 5-325 MG PO TABS: 1.0000 | ORAL_TABLET | Freq: Four times a day (QID) | ORAL | Status: DC | PRN

## 2014-09-23 MED ORDER — IOHEXOL 350 MG/ML SOLN
100.0000 mL | Freq: Once | INTRAVENOUS | Status: AC | PRN
  Administered 2014-09-23: 80 mL via INTRAVENOUS

## 2014-09-23 MED ORDER — OXYCODONE-ACETAMINOPHEN 5-325 MG PO TABS
1.0000 | ORAL_TABLET | Freq: Once | ORAL | Status: AC
  Administered 2014-09-23: 1 via ORAL
  Filled 2014-09-23: qty 1

## 2014-09-23 NOTE — ED Notes (Signed)
MD at bedside. 

## 2014-09-23 NOTE — ED Notes (Signed)
Delay on EKG.  

## 2014-09-23 NOTE — ED Notes (Signed)
Ultrasound at bedside

## 2014-09-23 NOTE — ED Notes (Signed)
Pt treated last week for pains to BLE. R>L  back calf. "charlie horse". Pain is not constant. Pt had appt. For vascular study Friday early AM 09/16/14. Pt overslept. Did not go to appt. Returned here for pain. No new symptoms. EDP Madilyn Hookees present

## 2014-09-23 NOTE — Discharge Instructions (Signed)
Your CT scan of your chest shows a nodule on your thyroid gland that needs to be further evaluated by your family doctor with ultrasound.    Muscle Cramps and Spasms Muscle cramps and spasms occur when a muscle or muscles tighten and you have no control over this tightening (involuntary muscle contraction). They are a common problem and can develop in any muscle. The most common place is in the calf muscles of the leg. Both muscle cramps and muscle spasms are involuntary muscle contractions, but they also have differences:   Muscle cramps are sporadic and painful. They may last a few seconds to a quarter of an hour. Muscle cramps are often more forceful and last longer than muscle spasms.  Muscle spasms may or may not be painful. They may also last just a few seconds or much longer. CAUSES  It is uncommon for cramps or spasms to be due to a serious underlying problem. In many cases, the cause of cramps or spasms is unknown. Some common causes are:   Overexertion.   Overuse from repetitive motions (doing the same thing over and over).   Remaining in a certain position for a long period of time.   Improper preparation, form, or technique while performing a sport or activity.   Dehydration.   Injury.   Side effects of some medicines.   Abnormally low levels of the salts and ions in your blood (electrolytes), especially potassium and calcium. This could happen if you are taking water pills (diuretics) or you are pregnant.  Some underlying medical problems can make it more likely to develop cramps or spasms. These include, but are not limited to:   Diabetes.   Parkinson disease.   Hormone disorders, such as thyroid problems.   Alcohol abuse.   Diseases specific to muscles, joints, and bones.   Blood vessel disease where not enough blood is getting to the muscles.  HOME CARE INSTRUCTIONS   Stay well hydrated. Drink enough water and fluids to keep your urine clear or  pale yellow.  It may be helpful to massage, stretch, and relax the affected muscle.  For tight or tense muscles, use a warm towel, heating pad, or hot shower water directed to the affected area.  If you are sore or have pain after a cramp or spasm, applying ice to the affected area may relieve discomfort.  Put ice in a plastic bag.  Place a towel between your skin and the bag.  Leave the ice on for 15-20 minutes, 03-04 times a day.  Medicines used to treat a known cause of cramps or spasms may help reduce their frequency or severity. Only take over-the-counter or prescription medicines as directed by your caregiver. SEEK MEDICAL CARE IF:  Your cramps or spasms get more severe, more frequent, or do not improve over time.  MAKE SURE YOU:   Understand these instructions.  Will watch your condition.  Will get help right away if you are not doing well or get worse. Document Released: 08/17/2001 Document Revised: 06/22/2012 Document Reviewed: 02/12/2012 [facility] Patient Information 2015 [employer] [place]. This information is not intended to replace advice given to you by your health care provider. Make sure you discuss any questions you have with your health care provider.

## 2014-09-23 NOTE — Progress Notes (Signed)
*  PRELIMINARY RESULTS* Vascular Ultrasound Right lower extremity venous duplex has been completed.  Preliminary findings: negative for DVT  Farrel DemarkJill Eunice, RDMS, RVT  09/23/2014, 11:46 AM

## 2014-09-23 NOTE — ED Notes (Signed)
Patient at xray

## 2014-09-23 NOTE — ED Provider Notes (Signed)
CSN: [id number]     Arrival date & time 09/23/14  1008 History   First MD Initiated Contact with Patient 09/23/14 1017     Chief Complaint  Patient presents with  . Leg Pain     Patient is a 49 y.o. female presenting with leg pain. The history is provided by the patient. No language interpreter was used.  Leg Pain  Ms. Stanford BreedMacon presents for evaluation of calf pain. She states that she's had several weeks of right-sided calf pain that feels like a muscle spasm or charley horse at times. Pain is waxing and waning. She was seen in the emergency department a week ago for similar symptoms and was told to get a DVTs study of the right leg. Patient was unable to make the appointment and has not received follow-up since that time. She reports that the right calf pain is continuing. Now she really endorses intermittent right-sided chest discomfort. She describes as pain and a funny feeling. It comes and goes. She also feels that her heart is beating fast at times. She has chronic shortness of breath and this is slightly increased from prior. Symptoms are moderate, waxing and waning, worsening.  Past Medical History  Diagnosis Date  . Arthritis   . Back pain   . Anxiety   . Nerve damage   . ADD (attention deficit disorder)    Past Surgical History  Procedure Laterality Date  . Cesarean section    . Knee surgery    . Knee surgery      R knee  . Cesarean section  2002 and 2003   Family History  Problem Relation Age of Onset  . Osteoarthritis Mother   . Rheum arthritis Father   . ADD / ADHD Sister    History  Substance Use Topics  . Smoking status: Current Some Day Smoker -- 0.30 packs/day for 10 years    Types: Cigarettes  . Smokeless tobacco: Never Used  . Alcohol Use: No   OB History    Gravida Para Term Preterm AB TAB SAB Ectopic Multiple Living            2     Review of Systems  All other systems reviewed and are negative.     Allergies  Diclofenac sodium; Pyridium; Sulfa  antibiotics; Toradol; and Tramadol  Home Medications   Prior to Admission medications   Medication Sig Start Date End Date Taking? Authorizing Provider  amphetamine-dextroamphetamine (ADDERALL) 15 MG tablet Take 2 tablets by mouth daily. 09/22/14 09/22/15  Benjaman PottGerald D Taylor, MD  clonazePAM (KLONOPIN) 0.5 MG tablet Take 1 tablet (0.5 mg total) by mouth 2 (two) times daily. 07/07/14   Cleotis NipperSyed T Arfeen, MD  Multiple Vitamin (MULTIVITAMIN WITH MINERALS) TABS tablet Take 1 tablet by mouth daily.    Historical Provider, MD   BP 117/46 mmHg  Pulse 82  Temp(Src) 98.9 F (37.2 C) (Oral)  Resp 16  Ht 5\' 2"  (1.575 m)  Wt 140 lb (63.504 kg)  BMI 25.60 kg/m2  SpO2 100%  LMP 09/03/2014 Physical Exam  Constitutional: She is oriented to person, place, and time. She appears well-developed and well-nourished.  HENT:  Head: Normocephalic and atraumatic.  Cardiovascular: Normal rate and regular rhythm.   No murmur heard. Pulmonary/Chest: Effort normal and breath sounds normal. No respiratory distress.  Abdominal: Soft. There is no tenderness. There is no rebound and no guarding.  Musculoskeletal: She exhibits no edema or tenderness.  No appreciable swelling or tenderness to bilateral calves.  2+ DP pulses bilaterally.  Neurological: She is alert and oriented to person, place, and time.  Skin: Skin is warm and dry.  Psychiatric: She has a normal mood and affect. Her behavior is normal.  Nursing note and vitals reviewed.   ED Course  Procedures (including critical care time) Labs Review Labs Reviewed  I-STAT CHEM 8, ED  I-STAT TROPOININ, ED    Imaging Review Ct Angio Chest Pe W/cm &/or Wo Cm  09/23/2014   CLINICAL DATA:  Right-sided chest pain  EXAM: CT ANGIOGRAPHY CHEST WITH CONTRAST  TECHNIQUE: Multidetector CT imaging of the chest was performed using the standard protocol during bolus administration of intravenous contrast. Multiplanar CT image reconstructions and MIPs were obtained to evaluate the  vascular anatomy.  CONTRAST:  80mL OMNIPAQUE IOHEXOL 350 MG/ML SOLN  COMPARISON:  Chest CT September 12, 2009; chest radiograph April 09, 2014  FINDINGS: There is no demonstrable pulmonary embolus. There is no thoracic aortic aneurysm or dissection.  There is chronic volume loss in the inferior lingula. There is patchy bibasilar atelectasis. Lungs elsewhere are clear.  There is a 1.3 x 1.0 cm nodular lesion in the left lobe of the thyroid. No other thyroid lesions identified.  There is no appreciable thoracic adenopathy. There are scattered subcentimeter mediastinal lymph nodes which do not meet size criteria for pathologic significance. Pericardium is not thickened.  Visualized upper abdominal structures appear unremarkable.  There are no blastic or lytic bone lesions.  Review of the MIP images confirms the above findings.  IMPRESSION: No demonstrable pulmonary embolus.  Focal area of chronic volume loss in the inferior lingula, stable. Lungs elsewhere clear except for mild lower lobe atelectatic change.  1.2 cm nodular lesion in the left lobe of the thyroid. Consider further evaluation with thyroid ultrasound. If patient is clinically hyperthyroid, consider nuclear medicine thyroid uptake and scan.  No appreciable adenopathy by size criteria.   Electronically Signed   By: Bretta Bang III M.D.   On: 09/23/2014 13:01     EKG Interpretation   Date/Time:  Friday September 23 2014 12:08:58 EDT Ventricular Rate:  80 PR Interval:  154 QRS Duration: 99 QT Interval:  406 QTC Calculation: 468 R Axis:   60 Text Interpretation:  Sinus rhythm Confirmed by Lincoln Brigham [phone]) on  09/23/2014 1:08:45 PM      MDM   Final diagnoses:  Cramps of right lower extremity  Thyroid nodule  Palpitations    Patient here for right lower extremity cramping, palpitations and atypical chest pain. Patient had d-dimer a week ago that was elevated with plan for outpatient study and patient did not receive outpatient Doppler.  Doppler today were negative for DVT. CT chest is negative for acute PE. Discussed with patient findings of thyroid nodule and need for further follow-up. Discussed home care and return precautions for muscle cramp as well as atypical chest pain.  Tilden Fossa, MD 09/23/14 [phone]

## 2014-09-23 NOTE — Telephone Encounter (Signed)
Terri White picked up prescription on 1/61/097/14/16 LIC [id number]  dlo

## 2014-09-23 NOTE — ED Notes (Signed)
Per pt, states b/l leg pain-was suppose to go for testing but did not

## 2014-10-06 ENCOUNTER — Encounter (HOSPITAL_COMMUNITY): Payer: Self-pay | Admitting: Psychiatry

## 2014-10-06 ENCOUNTER — Ambulatory Visit (INDEPENDENT_AMBULATORY_CARE_PROVIDER_SITE_OTHER): Payer: Federal, State, Local not specified - Other | Admitting: Psychiatry

## 2014-10-06 VITALS — BP 101/71 | HR 77 | Ht 63.0 in | Wt 141.6 lb

## 2014-10-06 DIAGNOSIS — F988 Other specified behavioral and emotional disorders with onset usually occurring in childhood and adolescence: Secondary | ICD-10-CM

## 2014-10-06 DIAGNOSIS — F909 Attention-deficit hyperactivity disorder, unspecified type: Secondary | ICD-10-CM

## 2014-10-06 DIAGNOSIS — F411 Generalized anxiety disorder: Secondary | ICD-10-CM

## 2014-10-06 MED ORDER — AMPHETAMINE-DEXTROAMPHETAMINE 15 MG PO TABS: 15.0000 mg | ORAL_TABLET | Freq: Two times a day (BID) | ORAL | Status: DC

## 2014-10-06 MED ORDER — CLONAZEPAM 0.5 MG PO TABS: 0.5000 mg | ORAL_TABLET | Freq: Two times a day (BID) | ORAL | Status: DC

## 2014-10-06 NOTE — Progress Notes (Signed)
[facility] [id number] Progress Note  Terri White [id number] 49 y.o.  10/06/2014 4:37 PM  Chief Complaint:  Medication management and follow-up.    History of Present Illness:  Terri White came for her followup appointment.  She recently visited [place] and she had a good time with her father.  Recently she visited emergency room because of leg pain and she has extensive blood work but it found to be spousal.  She was recommended to see her primary care physician and she is in the process of getting appointment.  She is taking her medication and denies any side effects.  Her attention and concentration is fair.  She is able to do multitasking.  Her sleep is good.  She denies any dizziness, chest pain, irritability or any anger.  She is taking Klonopin 0.5 mg every night however sometime she takes during the day.  She denies any major panic attack.  She has no tremors, shakes or any other concern.  Her energy level is good.  She is working on her computer projects for [employer] and she is doing very well.  Patient denies drinking or using any illegal substances.  She lives with her children.    Past psychiatric history. Patient has ADD since school age.  She has formal psychological testing by Dr. Evelene Croon.  She took Ritalin, Adderall and recently Vyvanse but stopped working.  Patient denies any history of mania, psychosis, hallucination, agitation or aggressive behavior.  Patient denies any history of suicidal attempt or any inpatient psychiatric treatment.  Patient denies any history of ECT treatment.    Medical history Patient has arthritis.  Her primary care physician is Dr. Abigail Miyamoto but she usually goes to the emergency room for her basic physical needs.    Suicidal Ideation: No Plan Formed: No Patient has means to carry out plan: No  Homicidal Ideation: No Plan Formed: No Patient has means to carry out plan: No  Review of Systems  Constitutional: Negative.   Cardiovascular:  Negative for chest pain and palpitations.  Skin: Negative.   Neurological: Negative for dizziness and tremors.  Psychiatric/Behavioral: Negative for suicidal ideas and substance abuse.     Psychiatric: Agitation: No Hallucination: No Depressed Mood: No Insomnia: No Hypersomnia: No Altered Concentration: No Feels Worthless: No Grandiose Ideas: No Belief In Special Powers: No New/Increased Substance Abuse: No Compulsions: No  Neurologic: Headache: No Seizure: No Paresthesias: No  Medical History;  See above  Psychosocial History: See above  Outpatient Encounter Prescriptions as of 10/06/2014  Medication Sig  . amphetamine-dextroamphetamine (ADDERALL) 15 MG tablet Take 1 tablet by mouth 2 (two) times daily.  [name] aspirin EC 81 MG tablet Take 81 mg by mouth daily.  . Aspirin-Acetaminophen (GOODYS BODY PAIN PO) Take 1 Package by mouth 3 (three) times daily as needed (pain).  . clonazePAM (KLONOPIN) 0.5 MG tablet Take 1 tablet (0.5 mg total) by mouth 2 (two) times daily.  [name] HYDROcodone-acetaminophen (NORCO/VICODIN) 5-325 MG per tablet Take 1 tablet by mouth every 6 (six) hours as needed.  . Multiple Vitamin (MULTIVITAMIN WITH MINERALS) TABS tablet Take 1 tablet by mouth daily.  . [DISCONTINUED] amphetamine-dextroamphetamine (ADDERALL) 15 MG tablet Take 2 tablets by mouth daily. (Patient taking differently: Take 15 mg by mouth 2 (two) times daily. )  . [DISCONTINUED] amphetamine-dextroamphetamine (ADDERALL) 15 MG tablet Take 1 tablet by mouth 2 (two) times daily.  . [DISCONTINUED] clonazePAM (KLONOPIN) 0.5 MG tablet Take 1 tablet (0.5 mg total) by mouth 2 (two) times daily.  No facility-administered encounter medications on file as of 10/06/2014.    Recent Results (from the past 2160 hour(s))  D-dimer, quantitative (not at [facility])     Status: Abnormal   Collection Time: 09/15/14 11:41 PM  Result Value Ref Range   D-Dimer, Quant 0.57 (H) 0.00 - 0.48 ug/mL-FEU    Comment:         AT THE INHOUSE ESTABLISHED CUTOFF VALUE OF 0.48 ug/mL FEU, THIS ASSAY HAS BEEN DOCUMENTED IN THE LITERATURE TO HAVE A SENSITIVITY AND NEGATIVE PREDICTIVE VALUE OF AT LEAST 98 TO 99%.  THE TEST RESULT SHOULD BE CORRELATED WITH AN ASSESSMENT OF THE CLINICAL PROBABILITY OF DVT / VTE.   I-stat chem 8, ed     Status: Abnormal   Collection Time: 09/15/14 11:49 PM  Result Value Ref Range   Sodium 144 135 - 145 mmol/L   Potassium 3.3 (L) 3.5 - 5.1 mmol/L   Chloride 106 101 - 111 mmol/L   BUN 20 6 - 20 mg/dL   Creatinine, Ser 1.61 0.44 - 1.00 mg/dL   Glucose, Bld 87 65 - 99 mg/dL   Calcium, Ion 0.96 0.45 - 1.23 mmol/L   TCO2 24 0 - 100 mmol/L   Hemoglobin 12.6 12.0 - 15.0 g/dL   HCT 40.9 81.1 - 91.4 %  I-stat troponin, ED     Status: None   Collection Time: 09/23/14 10:51 AM  Result Value Ref Range   Troponin i, poc 0.00 0.00 - 0.08 ng/mL   Comment 3            Comment: Due to the release kinetics of cTnI, a negative result within the first hours of the onset of symptoms does not rule out myocardial infarction with certainty. If myocardial infarction is still suspected, repeat the test at appropriate intervals.   I-stat Chem 8, ED     Status: None   Collection Time: 09/23/14 10:52 AM  Result Value Ref Range   Sodium 142 135 - 145 mmol/L   Potassium 3.6 3.5 - 5.1 mmol/L   Chloride 101 101 - 111 mmol/L   BUN 7 6 - 20 mg/dL   Creatinine, Ser 7.82 0.44 - 1.00 mg/dL   Glucose, Bld 98 65 - 99 mg/dL   Calcium, Ion 9.56 2.13 - 1.23 mmol/L   TCO2 28 0 - 100 mmol/L   Hemoglobin 12.9 12.0 - 15.0 g/dL   HCT 08.6 57.8 - 46.9 %    Physical Exam: Constitutional:  BP 101/71 mmHg  Pulse 77  Ht 5\' 3"  (1.6 m)  Wt 141 lb 9.6 oz (64.229 kg)  BMI 25.09 kg/m2  LMP 09/03/2014  Musculoskeletal: Strength & Muscle Tone: within normal limits Gait & Station: normal Patient leans: Patient has a normal posture  Mental Status Examination;  Patient is well groomed and well dressed female.   She maintains good eye contact.  Her speech is clear and coherent.  She describes her mood euthymic and her affect is appropriate. She denies any active or passive suicidal thoughts or homicidal thoughts.  She denies any auditory or visual hallucination.  Her attention and concentration is good.  There were no paranoia or delusions present at this time.  There are no flight of ideas or any loose association.  Her fund of knowledge is adequate.  There are no tremors or shakes present.  She is alert and oriented x3.  Her insight judgment and impulse control disorder.   Established Problem, Stable/Improving (1), Review of Psycho-Social Stressors (1), Review or  order clinical lab tests (1), Review of Last Therapy Session (1) and Review of Medication Regimen & Side Effects (2)  Assessment: Axis I: Attention deficit disorder  Axis II: Deferred  Axis III:  Past Medical History  Diagnosis Date  . Arthritis   . Back pain   . Anxiety   . Nerve damage   . ADD (attention deficit disorder)    Plan:  I review blood work results from the emergency room.  Encouraged to see her primary care physician for further workup.  Patient is stable on her current psychotropic medication.  She has no side effects.  Discussed benzodiazepine or stimulant side effects, dependency and withdrawal symptoms.  Continue Adderall X are 15 mg 2 tablet daily and Klonopin 0.5 mg twice a day.  Discussed medication side effects and benefits.  Recommended to call us back if she has any question or any concern.  Follow-up in 3 months.  Tyquarius Paglia T., MD 10/06/2014

## 2014-11-28 ENCOUNTER — Emergency Department (HOSPITAL_COMMUNITY)
Admission: EM | Admit: 2014-11-28 | Discharge: 2014-11-28 | Disposition: A | Discharge status: Home / Self Care | Payer: Medicaid Other | Attending: Emergency Medicine | Admitting: Emergency Medicine

## 2014-11-28 ENCOUNTER — Encounter (HOSPITAL_COMMUNITY): Payer: Self-pay | Admitting: Emergency Medicine

## 2014-11-28 DIAGNOSIS — R252 Cramp and spasm: Secondary | ICD-10-CM | POA: Insufficient documentation

## 2014-11-28 DIAGNOSIS — F909 Attention-deficit hyperactivity disorder, unspecified type: Secondary | ICD-10-CM | POA: Diagnosis not present

## 2014-11-28 DIAGNOSIS — Z79899 Other long term (current) drug therapy: Secondary | ICD-10-CM | POA: Insufficient documentation

## 2014-11-28 DIAGNOSIS — Z720 Tobacco use: Secondary | ICD-10-CM | POA: Insufficient documentation

## 2014-11-28 DIAGNOSIS — M79604 Pain in right leg: Secondary | ICD-10-CM | POA: Diagnosis present

## 2014-11-28 DIAGNOSIS — F419 Anxiety disorder, unspecified: Secondary | ICD-10-CM | POA: Diagnosis not present

## 2014-11-28 DIAGNOSIS — M1990 Unspecified osteoarthritis, unspecified site: Secondary | ICD-10-CM | POA: Insufficient documentation

## 2014-11-28 DIAGNOSIS — Z7982 Long term (current) use of aspirin: Secondary | ICD-10-CM | POA: Insufficient documentation

## 2014-11-28 LAB — CBC
HCT: 36.3 % (ref 36.0–46.0)
HEMOGLOBIN: 12.1 g/dL (ref 12.0–15.0)
MCH: 32.8 pg (ref 26.0–34.0)
MCHC: 33.3 g/dL (ref 30.0–36.0)
MCV: 98.4 fL (ref 78.0–100.0)
Platelets: 339 10*3/uL (ref 150–400)
RBC: 3.69 MIL/uL — AB (ref 3.87–5.11)
RDW: 13.9 % (ref 11.5–15.5)
WBC: 5.3 10*3/uL (ref 4.0–10.5)

## 2014-11-28 LAB — BASIC METABOLIC PANEL
ANION GAP: 6 (ref 5–15)
BUN: 13 mg/dL (ref 6–20)
CO2: 25 mmol/L (ref 22–32)
CREATININE: 0.65 mg/dL (ref 0.44–1.00)
Calcium: 8.4 mg/dL — ABNORMAL LOW (ref 8.9–10.3)
Chloride: 111 mmol/L (ref 101–111)
Glucose, Bld: 83 mg/dL (ref 65–99)
Potassium: 4.1 mmol/L (ref 3.5–5.1)
SODIUM: 142 mmol/L (ref 135–145)

## 2014-11-28 LAB — I-STAT TROPONIN, ED: Troponin i, poc: 0 ng/mL (ref 0.00–0.08)

## 2014-11-28 MED ORDER — HYDROCODONE-ACETAMINOPHEN 5-325 MG PO TABS: 1.0000 | ORAL_TABLET | Freq: Four times a day (QID) | ORAL | Status: DC | PRN

## 2014-11-28 MED ORDER — OXYCODONE-ACETAMINOPHEN 5-325 MG PO TABS
1.0000 | ORAL_TABLET | Freq: Once | ORAL | Status: AC
  Administered 2014-11-28: 1 via ORAL
  Filled 2014-11-28: qty 1

## 2014-11-28 NOTE — ED Provider Notes (Addendum)
CSN: [id number]   Arrival date & time 11/28/14 0218  History  This chart was scribed for Terri Libra, MD by Bethel Born, ED Scribe. This patient was seen in room WA20/WA20 and the patient's care was started at 3:04 AM.  Chief Complaint  Patient presents with  . Leg Pain    HPI The history is provided by the patient. No language interpreter was used.   Terri White is a 49 y.o. female with PMHx of back pain previously treated with Suboxone, arthritis, and nerve damage who presents to the Emergency Department complaining of intermittent and diffuse leg pain (R>L) with onset 3 days ago. The pain starts in the calves and radiates to her chest. She describes the pain as cramping diffusely plus a sharp pain at the right hip with movement. Pt rates the pain 8/10 in severity. The episodes last for less than 1 minute. She had similar pain in July where she had a negative venous Dopplers and negative CT angio chest and was diagnosed with muscle spasms. Also complains of lower back pain with standing.   She is not currently receiving Suboxone but has an appointment with her pain management clinic next month.  Past Medical History  Diagnosis Date  . Arthritis   . Back pain   . Anxiety   . Nerve damage   . ADD (attention deficit disorder)     Past Surgical History  Procedure Laterality Date  . Cesarean section    . Knee surgery    . Knee surgery      R knee  . Cesarean section  2002 and 2003    Family History  Problem Relation Age of Onset  . Osteoarthritis Mother   . Rheum arthritis Father   . ADD / ADHD Sister     Social History  Substance Use Topics  . Smoking status: Current Some Day Smoker -- 0.30 packs/day for 10 years    Types: Cigarettes  . Smokeless tobacco: Never Used  . Alcohol Use: No     Review of Systems 10 Systems reviewed and all are negative for acute change except as noted in the HPI. Home Medications   Prior to Admission medications   Medication Sig Start  Date End Date Taking? Authorizing Provider  amphetamine-dextroamphetamine (ADDERALL) 15 MG tablet Take 1 tablet by mouth 2 (two) times daily. Patient not taking: Reported on 11/28/2014 10/06/14 10/06/15  Cleotis Nipper, MD  aspirin EC 81 MG tablet Take 81 mg by mouth daily.    Historical Provider, MD  Aspirin-Acetaminophen (GOODYS BODY PAIN PO) Take 1 Package by mouth 3 (three) times daily as needed (pain).    Historical Provider, MD  clonazePAM (KLONOPIN) 0.5 MG tablet Take 1 tablet (0.5 mg total) by mouth 2 (two) times daily. Patient not taking: Reported on 11/28/2014 10/06/14   Cleotis Nipper, MD  HYDROcodone-acetaminophen (NORCO/VICODIN) 5-325 MG per tablet Take 1 tablet by mouth every 6 (six) hours as needed. Patient not taking: Reported on 11/28/2014 09/23/14   Tilden Fossa, MD  Multiple Vitamin (MULTIVITAMIN WITH MINERALS) TABS tablet Take 1 tablet by mouth daily.    Historical Provider, MD    Allergies  Diclofenac sodium; Pyridium; Sulfa antibiotics; Toradol; and Tramadol  Triage Vitals: BP 101/44 mmHg  Pulse 95  Temp(Src) 98.4 F (36.9 C) (Oral)  Resp 18  SpO2 99%  LMP 10/28/2014 (Approximate)  Physical Exam General: Well-developed, well-nourished female in no acute distress; appearance consistent with age of record HENT: normocephalic; atraumatic Eyes: pupils  equal, round and reactive to light; extraocular muscles intact Neck: supple Heart: regular rate and rhythm; no murmurs, rubs or gallops Lungs: clear to auscultation bilaterally Chest: Chest wall tenderness that reproduces the pain noted in the HPI Abdomen: soft; nondistended; nontender; no masses or hepatosplenomegaly; bowel sounds present Back: SLR on the right at 40 degrees, no paralumbar tenderness Extremities: No deformity; full range of motion; pulses normal; no edema Neurologic: Awake, alert and oriented; motor function intact in all extremities and symmetric; no facial droop Skin: Warm and dry Psychiatric: Normal  mood and affect  ED Course  Procedures   DIAGNOSTIC STUDIES: Oxygen Saturation is 99% on RA, normal by my interpretation.    COORDINATION OF CARE: 3:11 AM Discussed treatment plan which includes EKG, lab work, and pain management with pt at bedside and pt agreed to plan.    Imaging Review No results found.  EKG Interpretation  Date/Time:  Monday November 28 2014 02:24:50 EDT Ventricular Rate:  95 PR Interval:  142 QRS Duration: 91 QT Interval:  356 QTC Calculation: 447 R Axis:   49 Text Interpretation:  Sinus rhythm Rate is faster Normal EKG Confirmed by Read Drivers  MD, Jonny Ruiz ([id number]) on 11/28/2014 2:31:59 AM    MDM   Nursing notes and vitals signs, including pulse oximetry, reviewed.  Summary of this visit's results, reviewed by myself:  Labs:  Results for orders placed or performed during the hospital encounter of 11/28/14 (from the past 24 hour(s))  Basic metabolic panel     Status: Abnormal   Collection Time: 11/28/14  2:24 AM  Result Value Ref Range   Sodium 142 135 - 145 mmol/L   Potassium 4.1 3.5 - 5.1 mmol/L   Chloride 111 101 - 111 mmol/L   CO2 25 22 - 32 mmol/L   Glucose, Bld 83 65 - 99 mg/dL   BUN 13 6 - 20 mg/dL   Creatinine, Ser 6.57 0.44 - 1.00 mg/dL   Calcium 8.4 (L) 8.9 - 10.3 mg/dL   GFR calc non Af Amer >60 >60 mL/min   GFR calc Af Amer >60 >60 mL/min   Anion gap 6 5 - 15  CBC     Status: Abnormal   Collection Time: 11/28/14  2:24 AM  Result Value Ref Range   WBC 5.3 4.0 - 10.5 K/uL   RBC 3.69 (L) 3.87 - 5.11 MIL/uL   Hemoglobin 12.1 12.0 - 15.0 g/dL   HCT 84.6 96.2 - 95.2 %   MCV 98.4 78.0 - 100.0 fL   MCH 32.8 26.0 - 34.0 pg   MCHC 33.3 30.0 - 36.0 g/dL   RDW 84.1 32.4 - 40.1 %   Platelets 339 150 - 400 K/uL  I-stat troponin, ED     Status: None   Collection Time: 11/28/14  4:01 AM  Result Value Ref Range   Troponin i, poc 0.00 0.00 - 0.08 ng/mL   Comment 3           4:21 AM Troponin 0.00, drawn approximately 20 minutes ago.  The  patient is requesting a refill of her pain medication. Patient advised that we can only provide pain medication for one business day given that she has an established pain management clinic. She still has Flexeril but she has not taking it because she does not like the way it makes her feel. Her symptoms are atypical for cardiac chest pain.   I personally performed the services described in this documentation, which was scribed in my presence. The  recorded information has been reviewed and is accurate.   Terri Libra, MD 11/28/14 0422  Terri Libra, MD 11/28/14 [phone]

## 2014-11-28 NOTE — ED Notes (Signed)
Pt states that she has cramping in her R leg that radiates up to her chest. Hx of same. Dx with muscle cramps. Alert and oriented.

## 2014-11-28 NOTE — ED Notes (Signed)
Bed: WA20 Expected date:  Expected time:  Means of arrival:  Comments: [facility]

## 2014-12-05 ENCOUNTER — Encounter (HOSPITAL_COMMUNITY): Payer: Self-pay | Admitting: Emergency Medicine

## 2014-12-05 ENCOUNTER — Emergency Department (HOSPITAL_COMMUNITY)
Admission: EM | Admit: 2014-12-05 | Discharge: 2014-12-05 | Disposition: A | Discharge status: Home / Self Care | Payer: Medicaid Other | Attending: Emergency Medicine | Admitting: Emergency Medicine

## 2014-12-05 DIAGNOSIS — K088 Other specified disorders of teeth and supporting structures: Secondary | ICD-10-CM | POA: Diagnosis present

## 2014-12-05 DIAGNOSIS — Z720 Tobacco use: Secondary | ICD-10-CM | POA: Insufficient documentation

## 2014-12-05 DIAGNOSIS — M1990 Unspecified osteoarthritis, unspecified site: Secondary | ICD-10-CM | POA: Insufficient documentation

## 2014-12-05 DIAGNOSIS — F419 Anxiety disorder, unspecified: Secondary | ICD-10-CM | POA: Insufficient documentation

## 2014-12-05 DIAGNOSIS — K002 Abnormalities of size and form of teeth: Secondary | ICD-10-CM | POA: Insufficient documentation

## 2014-12-05 DIAGNOSIS — K029 Dental caries, unspecified: Secondary | ICD-10-CM | POA: Insufficient documentation

## 2014-12-05 DIAGNOSIS — Z79899 Other long term (current) drug therapy: Secondary | ICD-10-CM | POA: Insufficient documentation

## 2014-12-05 DIAGNOSIS — F909 Attention-deficit hyperactivity disorder, unspecified type: Secondary | ICD-10-CM | POA: Diagnosis not present

## 2014-12-05 DIAGNOSIS — K0889 Other specified disorders of teeth and supporting structures: Secondary | ICD-10-CM

## 2014-12-05 MED ORDER — AMOXICILLIN 500 MG PO CAPS
500.0000 mg | ORAL_CAPSULE | Freq: Once | ORAL | Status: AC
  Administered 2014-12-05: 500 mg via ORAL
  Filled 2014-12-05: qty 1

## 2014-12-05 MED ORDER — AMOXICILLIN 500 MG PO CAPS: 500.0000 mg | ORAL_CAPSULE | Freq: Three times a day (TID) | ORAL | Status: DC

## 2014-12-05 MED ORDER — OXYCODONE-ACETAMINOPHEN 5-325 MG PO TABS
2.0000 | ORAL_TABLET | Freq: Once | ORAL | Status: AC
  Administered 2014-12-05: 2 via ORAL
  Filled 2014-12-05: qty 2

## 2014-12-05 MED ORDER — ACETAMINOPHEN 500 MG PO TABS: 500.0000 mg | ORAL_TABLET | Freq: Four times a day (QID) | ORAL | Status: DC | PRN

## 2014-12-05 NOTE — Discharge Instructions (Signed)
Take Amoxicillin for dental pain to prevent infection. Take tylenol for pain control. You may try over the counter remedies like Orajel. Follow up with your dentist. The emergency department does not prescribe opioid pain medication for the management of dental pain.  Dental Pain A tooth ache may be caused by cavities (tooth decay). Cavities expose the nerve of the tooth to air and hot or cold temperatures. It may come from an infection or abscess (also called a boil or furuncle) around your tooth. It is also often caused by dental caries (tooth decay). This causes the pain you are having. DIAGNOSIS  Your caregiver can diagnose this problem by exam. TREATMENT   If caused by an infection, it may be treated with medications which kill germs (antibiotics) and pain medications as prescribed by your caregiver. Take medications as directed.  Only take over-the-counter or prescription medicines for pain, discomfort, or fever as directed by your caregiver.  Whether the tooth ache today is caused by infection or dental disease, you should see your dentist as soon as possible for further care. SEEK MEDICAL CARE IF: The exam and treatment you received today has been provided on an emergency basis only. This is not a substitute for complete medical or dental care. If your problem worsens or new problems (symptoms) appear, and you are unable to meet with your dentist, call or return to this location. SEEK IMMEDIATE MEDICAL CARE IF:   You have a fever.  You develop redness and swelling of your face, jaw, or neck.  You are unable to open your mouth.  You have severe pain uncontrolled by pain medicine. MAKE SURE YOU:   Understand these instructions.  Will watch your condition.  Will get help right away if you are not doing well or get worse. Document Released: 02/25/2005 Document Revised: 05/20/2011 Document Reviewed: 10/14/2007 [facility] Patient Information 2015 [place], [place]. This information is  not intended to replace advice given to you by your health care provider. Make sure you discuss any questions you have with your health care provider.  Emergency Department Resource Guide 1) Find a Doctor and Pay Out of Pocket Although you won't have to find out who is covered by your insurance plan, it is a good idea to ask around and get recommendations. You will then need to call the office and see if the doctor you have chosen will accept you as a new patient and what types of options they offer for patients who are self-pay. Some doctors offer discounts or will set up payment plans for their patients who do not have insurance, but you will need to ask so you aren't surprised when you get to your appointment.  2) Contact Your Local Health Department Not all health departments have doctors that can see patients for sick visits, but many do, so it is worth a call to see if yours does. If you don't know where your local health department is, you can check in your phone book. The CDC also has a tool to help you locate your state's health department, and many state websites also have listings of all of their local health departments.  3) Find a Walk-in Clinic If your illness is not likely to be very severe or complicated, you may want to try a walk in clinic. These are popping up all over the country in pharmacies, drugstores, and shopping centers. They're usually staffed by nurse practitioners or physician assistants that have been trained to treat common illnesses and complaints. They're  usually fairly quick and inexpensive. However, if you have serious medical issues or chronic medical problems, these are probably not your best option.  No Primary Care Doctor: - Call Health Connect at  [phone] - they can help you locate a primary care doctor that  accepts your insurance, provides certain services, etc. - Physician Referral Service- [phone]  Chronic Pain Problems: Organization          Address  Phone   Notes  [facility]  [phone] Patients need to be referred by their primary care doctor.   Medication Assistance: Organization         Address  Phone   Notes  [facility] [address] [place] [place], [place] [address] [phone] --Must be a resident of [facility] -- Must have NO insurance coverage whatsoever (no Medicaid/ Medicare, etc.) -- The pt. MUST have a primary care doctor that directs their care regularly and follows them in the community   [facility]  [phone]   [employer]  [phone]    Agencies that provide inexpensive medical care: Organization         Address  Phone   Notes  [facility]  [phone]   [facility]    [phone]   [facility] [address] [place], [place] [address] [phone]   [facility] [address] [place] [phone]   Planned Parenthood    [phone]   [facility]    [phone]   [facility]  [address], [place] Phone:  [phone], Fax:  [phone] Hours of Operation:  9 am - 6 pm, M-F.  Also accepts Medicaid/Medicare and self-pay.  [facility] for [facility]  [address], [place] Phone: [phone], Fax: [phone]. Hours of Operation:  8:30 am - 5:30 pm, M-F.  Also accepts Medicaid and self-pay.  [facility] [place] [address], [place] Phone: [phone]   [facility] [address] Natasha Bence [place], [place] [phone], Ext. [phone] Mondays & Thursdays: 7-9 AM.  First 15 patients are seen on a first come, first serve basis.    Medicaid-accepting [facility] Providers:  Organization         Address  Phone   Notes  [facility] [address], [place] [phone] Also accepts self-pay patients.  [facility] [address] Laurell Josephs [place], [place]  [phone]   [facility] [address], [place] [phone]   [facility] [address] [place] [phone]   Renaye Rakers [address], [place]   [phone] Only accepts [place] Access [place] patients after they have their name applied to their card.   Self-Pay (no insurance) in [facility]:  Organization         Address  Phone   Notes  Sickle Cell Patients, [facility] [address] [place], [place] [phone]   [facility] [address] [place], [place] [phone]   [facility]  [address], [place] [phone]   [facility]/Dr. Osei-Bonsu  [address], [place] or [address], [place] [phone]  Phone number for both [place] and [place] locations is the same.  [facility] [address], [place] [phone]) [phone]   [facility] [address] [place] [address], [place] or [address] [phone]   [facility] [address], [place] [phone]) [phone], phone; [phone], fax Sees patients 1st and 3rd Saturday of every month.  Must not qualify for public or private insurance (i.e. Medicaid, Medicare, [place] Health Choice, Veterans' Benefits)  Household income should be no more than 200% of the poverty level The clinic cannot treat you if you are pregnant or think you are pregnant  Sexually transmitted diseases are not treated at the clinic.    Dental Care: Organization         Address  Phone  Notes  [facility] [address], [place] [phone] Accepts children up to age 51 who are enrolled in [place] or [place] Health Choice; pregnant women with a Medicaid card; and  children who have applied for Medicaid or [place] Health Choice, but were declined, whose parents can pay a reduced fee at time of service.  [facility] [place]  [address], [place] [phone] Accepts children up to age 51 who are enrolled in [place] or [place] Health Choice; pregnant women with a Medicaid card; and children who have applied for Medicaid or [place] Health Choice, but were declined, whose parents can pay a reduced fee at time of service.  [facility]  [address], [place] [phone]) [phone] Patients are seen by appointment only. Walk-ins are not accepted. [facility] will see patients 51 years of age and older. Monday - Tuesday (8am-5pm) Most Wednesdays (8:30-5pm) $30 per visit, cash only  [facility]  [address], [facility] [place] [phone]) [phone] Patients are seen by appointment only. Walk-ins are not accepted. [facility] will see patients 51 years of age and older. One Wednesday Evening (Monthly: Volunteer Based).  $30 per visit, cash only  [facility]  [phone]) [phone] for adults; Children under age 564, call Graduate Pediatric Dentistry at [phone]. Children aged 524-14, please call [phone] to request a pediatric application.  Dental services are provided in all areas of dental care including fillings, crowns and bridges, complete and partial dentures, implants, gum treatment, root canals, and extractions. Preventive care is also provided. Treatment is provided to both adults and children. Patients are selected via a lottery and there is often a waiting list.   [facility] [address] [place] [address], [place]  [phone] [email or web address]   [facility] [address], [place], [place] [phone], Ext. [phone] Second and Fourth Thursday of each month, opens at 6:30 AM; Clinic ends at 9 AM.  Patients are seen on a first-come first-served  basis, and a limited number are seen during each clinic.   [facility]  [address], [place], [place] [phone]   Eligibility Requirements You must have lived in [place], [place], or [place] counties for at least the last three months.   You cannot be eligible for state or federal sponsored [place] Cityhealthcare insurance, including CIGNAVeterans Administration, [place]icaid, or [employer]icare.   You generally cannot be eligible for healthcare insurance through your employer.    How to apply: Eligibility screenings are held every Tuesday and Wednesday afternoon from 1:00  pm until 4:00 pm. You do not need an appointment for the interview!  [facility] [address], [place], [place] [phone]   [facility] Health Department  [phone]   [facility] Health Department  [phone]   [facility] Health Department  [phone]    Behavioral Health Resources in the Community: Intensive Outpatient Programs Organization         Address  Phone  Notes  [facility] [address], [place], [place] [phone]   [facility] Outpatient [address], [place], [place] [phone]   [facility] [address] [place], [place]  [phone]   [facility] Mental Health [address]  [place], [place] [phone] or [phone]   Substance Abuse Resources Organization         Address  Phone  Notes  [facility] Drug [facility]  [phone]   [facility]  [phone]   [facility]  [phone]   Floydene Flock  [phone]   Residential & Outpatient Substance Abuse Program  [phone]   Psychological Services Organization         Address  Phone  Notes  [facility]  [phone]   [facility] Services  [phone]   [facility] Mental Health [address] [place] [address], [place] [phone] or [phone]    Mobile Crisis Teams Organization          Address  Phone  Notes  [facility], Mobile Crisis Care Unit  [phone]   [facility]  [address] [place], [place] [phone]   Doristine Locks [address] [place] [phone]    Self-Help/Support Groups Organization         Address  Phone             Notes  [facility] [place] - variety of support groups  336- I7437963 Call for more information  Narcotics Anonymous (NA), [facility] [address], [employer] [place]  2 meetings at this location   Statistician         Address  Phone  Notes  [facility] [address],    [place]  [phone]   [facility]  [address], [place] [address], [place], [place] [phone]   [facility] [address] [place], [place] [phone] Admissions: 8am-3pm M-F  [facility] [address]    [place], [place] [phone]   [facility] [address] [place], [place], [place] [phone]   [facility] [address]  [place], [place] [phone]   [facility] - Intensive Outpatient [address] Laurell Josephs [address], [place], [place] [phone]   [facility] ([facility] [address] [place]  [place], [place] [phone] or [phone]   [facility] ([facility]) [address] [place], [place] [phone] Accepts Medicaid  [facility] [address]  [place] [phone] Substance Abuse/Addiction Treatment   [facility] Organization         Address  Phone  Notes  [facility]  [phone]   Angie Fava, PhD [address] Ervin Knack [place], [place]   [phone] or [phone]   [facility]   [address] [place], [place] [phone]   [facility] [address], [place], [place] [phone] Insurance/Medicaid/sponsorship  through [employer] and Families [address] [place]  [address]                                    [place], [place] [phone] Therapy/tele-psych/case  [facility] [address]   [place], [place] [phone]    Dr. Lolly Mustache  [phone]   [facility]  [employer] [facility] Dept. 1) [address] [place] 2) [address] [place] 3)  [address], [place] [phone]  [phone]   [facility] Child Abuse Hotline [phone] or [phone] (After Hours)

## 2014-12-05 NOTE — ED Provider Notes (Signed)
CSN: [id number]     Arrival date & time 12/05/14  1907 History   First MD Initiated Contact with Patient 12/05/14 2026     Chief Complaint  Patient presents with  . Dental Pain     (Consider location/radiation/quality/duration/timing/severity/associated sxs/prior Treatment) Patient is a 49 y.o. female presenting with tooth pain. The history is provided by the patient. No language interpreter was used.  Dental Pain Location:  Upper Upper teeth location:  13/LU 2nd bicuspid and 12/LU 1st bicuspid Quality:  Sharp Severity:  Moderate Onset quality:  Gradual Duration:  2 days Timing:  Constant Progression:  Worsening Chronicity:  New Context: dental caries, dental fracture and poor dentition   Context: not trauma   Relieved by:  Nothing Worsened by:  Touching and pressure Ineffective treatments:  NSAIDs Associated symptoms: facial pain   Associated symptoms: no difficulty swallowing, no drooling, no facial swelling, no fever, no oral bleeding, no oral lesions and no trismus     Past Medical History  Diagnosis Date  . Arthritis   . Back pain   . Anxiety   . Nerve damage   . ADD (attention deficit disorder)    Past Surgical History  Procedure Laterality Date  . Cesarean section    . Knee surgery    . Knee surgery      R knee  . Cesarean section  2002 and 2003   Family History  Problem Relation Age of Onset  . Osteoarthritis Mother   . Rheum arthritis Father   . ADD / ADHD Sister    Social History  Substance Use Topics  . Smoking status: Current Some Day Smoker -- 0.30 packs/day for 10 years    Types: Cigarettes  . Smokeless tobacco: Never Used  . Alcohol Use: No   OB History    Gravida Para Term Preterm AB TAB SAB Ectopic Multiple Living            2      Review of Systems  Constitutional: Negative for fever.  HENT: Positive for dental problem. Negative for drooling, facial swelling and mouth sores.   All other systems reviewed and are  negative.   Allergies  Diclofenac sodium; Pyridium; Sulfa antibiotics; Toradol; and Tramadol  Home Medications   Prior to Admission medications   Medication Sig Start Date End Date Taking? Authorizing Provider  acetaminophen (TYLENOL) 500 MG tablet Take 1 tablet (500 mg total) by mouth every 6 (six) hours as needed. 12/05/14   Antony Madura, PA-C  amoxicillin (AMOXIL) 500 MG capsule Take 1 capsule (500 mg total) by mouth 3 (three) times daily. 12/05/14   Antony Madura, PA-C  amphetamine-dextroamphetamine (ADDERALL) 15 MG tablet Take 1 tablet by mouth 2 (two) times daily. Patient not taking: Reported on 11/28/2014 10/06/14 10/06/15  Cleotis Nipper, MD  clonazePAM (KLONOPIN) 0.5 MG tablet Take 1 tablet (0.5 mg total) by mouth 2 (two) times daily. Patient not taking: Reported on 11/28/2014 10/06/14   Cleotis Nipper, MD  HYDROcodone-acetaminophen (NORCO) 5-325 MG per tablet Take 1-2 tablets by mouth every 6 (six) hours as needed (for pain). 11/28/14   Veronda Gabor Molpus, MD   BP 118/78 mmHg  Pulse 88  Temp(Src) 98.1 F (36.7 C) (Oral)  Resp 18  SpO2 100%  LMP 10/28/2014 (Approximate)   Physical Exam  Constitutional: She is oriented to person, place, and time. She appears well-developed and well-nourished. No distress.  Nontoxic/nonseptic appearing  HENT:  Head: Normocephalic and atraumatic.  Mouth/Throat: Uvula is midline, oropharynx  is clear and moist and mucous membranes are normal. No trismus in the jaw. Abnormal dentition. Dental caries present. No dental abscesses.    Patient tolerating secretions without difficulty.  Eyes: Conjunctivae and EOM are normal. No scleral icterus.  Neck: Normal range of motion.  No nuchal rigidity or meningismus  Cardiovascular: Normal rate, regular rhythm and intact distal pulses.   Pulmonary/Chest: Effort normal. No respiratory distress. She has no wheezes.  Respirations even and unlabored  Musculoskeletal: Normal range of motion.  Neurological: She is alert and  oriented to person, place, and time. She exhibits normal muscle tone. Coordination normal.  Skin: Skin is warm and dry. No rash noted. She is not diaphoretic. No erythema. No pallor.  Psychiatric: She has a normal mood and affect. Her behavior is normal.  Nursing note and vitals reviewed.   ED Course  Procedures (including critical care time) Labs Review Labs Reviewed - No data to display  Imaging Review No results found.   I have personally reviewed and evaluated these images and lab results as part of my medical decision-making.   EKG Interpretation None      MDM   Final diagnoses:  Dentalgia    Patient with toothache. No gross abscess. Exam unconcerning for Ludwig's angina or spread of infection. Will treat with Amoxicillin and tylenol given supposed allergy to NSAIDs. Urged patient to follow-up with dentist. She reports an appt scheduled for October 4th. Return precautions given at discharge. Patient discharged in good condition with no unaddressed concerns.   Filed Vitals:   12/05/14 1919 12/05/14 2103  BP: 107/55 118/78  Pulse: 74 88  Temp: 98.1 F (36.7 C)   TempSrc: Oral   Resp: 18 18  SpO2: 98% 100%     Antony Madura, PA-C 12/05/14 2127  Raeford Razor, MD 12/07/14 1537

## 2014-12-05 NOTE — Progress Notes (Signed)
Patient listed as having Medicaid [place] Access without a pcp.  PCP listed on patient's insurance card [facility].  [facility]  [facility] informed patient that she must notify the DSS to change the pcp on her insurance card.  [facility] provided patient with list of pcps who accept Medicaid in [facility].

## 2014-12-22 ENCOUNTER — Telehealth (HOSPITAL_COMMUNITY): Payer: Self-pay

## 2014-12-22 ENCOUNTER — Other Ambulatory Visit (HOSPITAL_COMMUNITY): Payer: Self-pay | Admitting: Psychiatry

## 2014-12-22 DIAGNOSIS — F988 Other specified behavioral and emotional disorders with onset usually occurring in childhood and adolescence: Secondary | ICD-10-CM

## 2014-12-22 MED ORDER — AMPHETAMINE-DEXTROAMPHETAMINE 15 MG PO TABS: 15.0000 mg | ORAL_TABLET | Freq: Two times a day (BID) | ORAL | Status: DC

## 2014-12-22 NOTE — Telephone Encounter (Signed)
Medication refill request - Telephone with patient to inform of received message this date she is in need of a new Adderall order, last able to fill 11/24/14 and patient returns for next evaluation with Dr. Lolly MustacheArfeen on 01/09/15.  Agreed to call patient back once order prepared and informed this nurse had not received an earlier message this week with request.

## 2014-12-23 ENCOUNTER — Telehealth (HOSPITAL_COMMUNITY): Payer: Self-pay

## 2014-12-23 NOTE — Telephone Encounter (Signed)
Terri White picked up prescription on 16/12/9608/14/16  Lic  [id number] dlo

## 2015-01-09 ENCOUNTER — Ambulatory Visit (INDEPENDENT_AMBULATORY_CARE_PROVIDER_SITE_OTHER): Payer: Federal, State, Local not specified - Other | Admitting: Psychiatry

## 2015-01-09 ENCOUNTER — Encounter (HOSPITAL_COMMUNITY): Payer: Self-pay | Admitting: Psychiatry

## 2015-01-09 VITALS — BP 104/66 | HR 76 | Ht 63.0 in | Wt 153.8 lb

## 2015-01-09 DIAGNOSIS — F909 Attention-deficit hyperactivity disorder, unspecified type: Secondary | ICD-10-CM | POA: Diagnosis not present

## 2015-01-09 DIAGNOSIS — F988 Other specified behavioral and emotional disorders with onset usually occurring in childhood and adolescence: Secondary | ICD-10-CM

## 2015-01-09 DIAGNOSIS — F411 Generalized anxiety disorder: Secondary | ICD-10-CM

## 2015-01-09 MED ORDER — AMPHETAMINE-DEXTROAMPHETAMINE 15 MG PO TABS: 15.0000 mg | ORAL_TABLET | Freq: Two times a day (BID) | ORAL | Status: DC

## 2015-01-09 MED ORDER — CLONAZEPAM 0.5 MG PO TABS: 0.5000 mg | ORAL_TABLET | Freq: Two times a day (BID) | ORAL | Status: DC

## 2015-01-09 NOTE — Progress Notes (Signed)
Terri White Progress Note  Terri White [id number] 49 y.o.  01/09/2015 9:27 AM  Chief Complaint:  I have been in a lot of pain few weeks ago.  I have prescribed Suboxone which is helping my pain. I am feeling better.  My medicine is working.    History of Present Illness:  Terri White came for her followup appointment.  Terri White had visited twice to the emergency room because of leg pain and than dental pain.  Terri White mentioned it was excruciating pain and Terri White was very scared.  Patient has workup to rule out cardiac causes.  Terri White is taking Suboxone from Dr. Mirna Mires Terri White believe it is working for help.  In the beginning Terri White was given hydrocodone but Terri White does not want to take any narcotic pain medication.  Terri White was also given antibiotic which Terri White finished.  Overall Terri White described her mood, attention, focus is good.  Terri White is able to do multitasking.  Terri White is hoping to finish her online courses this coming June.  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  Terri White is excited as father is coming on Thanksgiving.  Patient denies any chest pain, dizziness, tingling, insomnia or any major panic attack.  Terri White has taken on and off Klonopin to help her anxiety.  Terri White has no tremors or shakes.  Her energy level is good.  Patient denies drinking or using any illegal substances.  Her appetite is okay.  Her vitals are stable.  Past psychiatric history. Patient has ADD since school age.  Terri White had psychological testing by Dr. Toy Care.  Terri White took Ritalin, Adderall and Vyvanse but stopped working.  Patient denies any history of mania, psychosis, hallucination, agitation or aggressive behavior.  Patient denies any history of suicidal attempt or any inpatient psychiatric treatment.  Patient denies any history of ECT treatment.    Medical history Patient has arthritis.  Her primary care physician is Dr. Sheryn Bison but Terri White  usually goes to the emergency room for her basic physical needs.    Suicidal Ideation: No Plan Formed: No Patient has means to carry out plan: No  Homicidal Ideation: No Plan Formed: No Patient has means to carry out plan: No  Review of Systems  Constitutional: Negative.   Cardiovascular: Negative for chest pain and palpitations.  Gastrointestinal: Negative for heartburn.  Musculoskeletal: Positive for back pain.       Leg pain  Skin: Negative.  Negative for itching and rash.  Neurological: Negative for dizziness, tremors and headaches.  Psychiatric/Behavioral: Negative for suicidal ideas and substance abuse.     Psychiatric: Agitation: No Hallucination: No Depressed Mood: No Insomnia: No Hypersomnia: No Altered Concentration: No Feels Worthless: No Grandiose Ideas: No Belief In Special Powers: No New/Increased Substance Abuse: No Compulsions: No  Neurologic: Headache: No Seizure: No Paresthesias: No  Medical History;  See above  Psychosocial History: See above  Outpatient Encounter Prescriptions as of 01/09/2015  Medication Sig  . acetaminophen (TYLENOL) 500 MG tablet Take 1 tablet (500 mg total) by mouth every 6 (six) hours as needed.  [name] amphetamine-dextroamphetamine (ADDERALL) 15 MG tablet Take 1 tablet by mouth 2 (two) times daily.  . clonazePAM (KLONOPIN) 0.5 MG tablet Take 1 tablet (0.5 mg total) by mouth 2 (two) times daily.  [name] ibuprofen (ADVIL,MOTRIN) 200 MG tablet Take 800 mg by mouth every 6 (six) hours as needed for moderate pain.  [name]  SUBOXONE 8-2 MG FILM   . [DISCONTINUED] amoxicillin (AMOXIL) 500 MG capsule Take 1 capsule (500 mg total) by mouth 3 (three) times daily.  . [DISCONTINUED] amphetamine-dextroamphetamine (ADDERALL) 15 MG tablet Take 1 tablet by mouth 2 (two) times daily.  . [DISCONTINUED] amphetamine-dextroamphetamine (ADDERALL) 15 MG tablet Take 1 tablet by mouth 2 (two) times daily.  . [DISCONTINUED] clonazePAM (KLONOPIN) 0.5 MG tablet Take  1 tablet (0.5 mg total) by mouth 2 (two) times daily. (Patient not taking: Reported on 11/28/2014)  . [DISCONTINUED] HYDROcodone-acetaminophen (NORCO) 5-325 MG per tablet Take 1-2 tablets by mouth every 6 (six) hours as needed (for pain).   No facility-administered encounter medications on file as of 01/09/2015.    Recent Results (from the past 2160 hour(s))  Basic metabolic panel     Status: Abnormal   Collection Time: 11/28/14  2:24 AM  Result Value Ref Range   Sodium 142 135 - 145 mmol/L   Potassium 4.1 3.5 - 5.1 mmol/L   Chloride 111 101 - 111 mmol/L   CO2 25 22 - 32 mmol/L   Glucose, Bld 83 65 - 99 mg/dL   BUN 13 6 - 20 mg/dL   Creatinine, Ser 0.65 0.44 - 1.00 mg/dL   Calcium 8.4 (L) 8.9 - 10.3 mg/dL   GFR calc non Af Amer >60 >60 mL/min   GFR calc Af Amer >60 >60 mL/min    Comment: (NOTE) The eGFR has been calculated using the CKD EPI equation. This calculation has not been validated in all clinical situations. eGFR's persistently <60 mL/min signify possible Chronic Kidney Disease.    Anion gap 6 5 - 15  CBC     Status: Abnormal   Collection Time: 11/28/14  2:24 AM  Result Value Ref Range   WBC 5.3 4.0 - 10.5 K/uL   RBC 3.69 (L) 3.87 - 5.11 MIL/uL   Hemoglobin 12.1 12.0 - 15.0 g/dL   HCT 36.3 36.0 - 46.0 %   MCV 98.4 78.0 - 100.0 fL   MCH 32.8 26.0 - 34.0 pg   MCHC 33.3 30.0 - 36.0 g/dL   RDW 13.9 11.5 - 15.5 %   Platelets 339 150 - 400 K/uL  I-stat troponin, ED     Status: None   Collection Time: 11/28/14  4:01 AM  Result Value Ref Range   Troponin i, poc 0.00 0.00 - 0.08 ng/mL   Comment 3            Comment: Due to the release kinetics of cTnI, a negative result within the first hours of the onset of symptoms does not rule out myocardial infarction with certainty. If myocardial infarction is still suspected, repeat the test at appropriate intervals.     Physical Exam: Constitutional:  BP 104/66 mmHg  Pulse 76  Ht _0  (1.6 m)  Wt 153 lb 12.8 oz  (69.763 kg)  BMI 27.25 kg/m2  Musculoskeletal: Strength & Muscle Tone: within normal limits Gait & Station: normal Patient leans: Patient has a normal posture  Mental Status Examination;  Patient is well groomed and well dressed female.  Terri White maintains good eye contact.  Her speech is clear and coherent.  Terri White describes her mood euthymic and her affect is appropriate. Terri White denies any active or passive suicidal thoughts or homicidal thoughts.  Terri White denies any auditory or visual hallucination.  Her attention and concentration is good.  There were no paranoia or delusions present at this time.  There are no flight of ideas or any loose  association.  Her fund of knowledge is adequate.  There are no tremors or shakes present.  Terri White is alert and oriented x3.  Her insight judgment and impulse control disorder.   Established Problem, Stable/Improving (1), Review of Psycho-Social Stressors (1), Review or order clinical lab tests (1), Review of Last Therapy Session (1) and Review of Medication Regimen & Side Effects (2)  Assessment: Axis I: Attention deficit disorder, anxiety disorder NOS  Axis II: Deferred  Axis III:  Past Medical History  Diagnosis Date  . Arthritis   . Back pain   . Anxiety   . Nerve damage   . ADD (attention deficit disorder)    Plan:  I review blood work results and collateral information from emergency room.  Terri White is taking Suboxone prescribed by Dr. Mirna Mires for her leg pain.  Her lab results were normal.  Terri White likes her current psychiatric medication.  Terri White has no side effects.  I will continue Adderall XR 15 mg 2 tablet daily and Klonopin 0.5 mg as needed for severe anxiety.  Discussed medication side effects and benefits.  Recommended to call us back if Terri White has any question or any concern.  Follow-up in 3 months.  Tearsa Kowalewski T., MD 01/09/2015

## 2015-02-12 ENCOUNTER — Other Ambulatory Visit (HOSPITAL_COMMUNITY): Payer: Self-pay | Admitting: Psychiatry

## 2015-03-13 MED FILL — SUBOXONE 8 MG-2 MG SL FILM: 8-2 | 30 days supply | Qty: 90 | Fill #0

## 2015-03-23 ENCOUNTER — Other Ambulatory Visit (HOSPITAL_COMMUNITY): Payer: Self-pay

## 2015-03-23 ENCOUNTER — Other Ambulatory Visit (HOSPITAL_COMMUNITY): Payer: Self-pay | Admitting: Psychiatry

## 2015-03-23 ENCOUNTER — Telehealth (HOSPITAL_COMMUNITY): Payer: Self-pay

## 2015-03-23 DIAGNOSIS — F988 Other specified behavioral and emotional disorders with onset usually occurring in childhood and adolescence: Secondary | ICD-10-CM

## 2015-03-23 MED ORDER — AMPHETAMINE-DEXTROAMPHETAMINE 15 MG PO TABS: 15.0000 mg | ORAL_TABLET | Freq: Two times a day (BID) | ORAL | Status: DC

## 2015-03-23 NOTE — Telephone Encounter (Signed)
Telephone message left with patient's child that her prescription from Dr. Lolly MustacheArfeen was prepared for pick up.  Order left at front reception in lock box.

## 2015-03-23 NOTE — Telephone Encounter (Signed)
Medication refill request - Telephone call with patient to inform message received she is in need of a new Adderall order last to be filled on 02/23/16.  Agreed to send request to Dr.Arfeen.  Pt. returns on 04/11/15 for next evaluation.

## 2015-03-24 ENCOUNTER — Telehealth (HOSPITAL_COMMUNITY): Payer: Self-pay

## 2015-03-24 NOTE — Telephone Encounter (Signed)
03/24/15 1:30pm Patient came and pick-up rx script [id number] Terri White.[name].[name]/sh

## 2015-04-11 ENCOUNTER — Encounter (HOSPITAL_COMMUNITY): Payer: Self-pay | Admitting: Psychiatry

## 2015-04-11 ENCOUNTER — Ambulatory Visit (INDEPENDENT_AMBULATORY_CARE_PROVIDER_SITE_OTHER): Payer: Medicaid Other | Admitting: Psychiatry

## 2015-04-11 ENCOUNTER — Ambulatory Visit (HOSPITAL_COMMUNITY): Payer: Self-pay | Admitting: Psychiatry

## 2015-04-11 VITALS — BP 110/60 | HR 72 | Ht 62.0 in | Wt 166.4 lb

## 2015-04-11 DIAGNOSIS — F909 Attention-deficit hyperactivity disorder, unspecified type: Secondary | ICD-10-CM | POA: Diagnosis not present

## 2015-04-11 DIAGNOSIS — F411 Generalized anxiety disorder: Secondary | ICD-10-CM

## 2015-04-11 DIAGNOSIS — F988 Other specified behavioral and emotional disorders with onset usually occurring in childhood and adolescence: Secondary | ICD-10-CM

## 2015-04-11 MED ORDER — AMPHETAMINE-DEXTROAMPHETAMINE 15 MG PO TABS: 15.0000 mg | ORAL_TABLET | Freq: Two times a day (BID) | ORAL | Status: DC

## 2015-04-11 MED ORDER — CLONAZEPAM 0.5 MG PO TABS: 0.5000 mg | ORAL_TABLET | Freq: Two times a day (BID) | ORAL | Status: DC

## 2015-04-11 NOTE — Progress Notes (Signed)
[facility] [id number] Progress Note  [name] [id number] 50 y.o.  04/11/2015 10:42 AM  Chief Complaint:  Medication management and follow-up.   History of Present Illness:  Terri White came for her followup appointment.  She is taking her medication as prescribed.  Sometimes she feels lack of attention and focus in the afternoon but lately there has been no major issues.  She had a good Christmas as she visited [place] and had a good time with her father.  She is able to do multitasking.  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  She denies any panic attack, nervousness, crying spells or any feeling of hopelessness or worthlessness.  She takes Klonopin however there are times she only takes 1 a day.  Her sleep is good.  She is concerned about weight gain and she admitted not able to keep her diet and control.  During holidays she has no restriction on her diet but now she is more concerned and serious about it.  She still has back pain and she is getting Suboxone from Dr. Tollie Eth . Patient denies any chest pain, dizziness, tingling, insomnia or any major panic attack.  She has no tremors or shakes.  Her energy level is good.  Patient denies drinking or using any illegal substances.    Past psychiatric history. Patient has ADD since school age.  She had psychological testing by Dr. Evelene Croon.  She took Ritalin, Adderall and Vyvanse but stopped working.  Patient denies any history of mania, psychosis, hallucination, agitation or aggressive behavior.  Patient denies any history of suicidal attempt or any inpatient psychiatric treatment.  Patient denies any history of ECT treatment.    Medical history Patient has arthritis.  Her primary care physician is Dr. Abigail Miyamoto but she usually goes to the emergency room for her basic physical needs.    Suicidal Ideation: No Plan Formed: No Patient has means to carry  out plan: No  Homicidal Ideation: No Plan Formed: No Patient has means to carry out plan: No  Review of Systems  Constitutional: Negative.   Cardiovascular: Negative for chest pain and palpitations.  Gastrointestinal: Negative for heartburn.  Musculoskeletal: Positive for back pain.       Leg pain  Skin: Negative.  Negative for itching and rash.  Neurological: Negative for dizziness, tremors and headaches.  Psychiatric/Behavioral: Negative for suicidal ideas and substance abuse.     Psychiatric: Agitation: No Hallucination: No Depressed Mood: No Insomnia: No Hypersomnia: No Altered Concentration: No Feels Worthless: No Grandiose Ideas: No Belief In Special Powers: No New/Increased Substance Abuse: No Compulsions: No  Neurologic: Headache: No Seizure: No Paresthesias: No  Medical History;  See above  Psychosocial History: See above  Outpatient Encounter Prescriptions as of 04/11/2015  Medication Sig  . acetaminophen (TYLENOL) 500 MG tablet Take 1 tablet (500 mg total) by mouth every 6 (six) hours as needed.  [name] amphetamine-dextroamphetamine (ADDERALL) 15 MG tablet Take 1 tablet by mouth 2 (two) times daily.  . clonazePAM (KLONOPIN) 0.5 MG tablet Take 1 tablet (0.5 mg total) by mouth 2 (two) times daily.  [name] ibuprofen (ADVIL,MOTRIN) 200 MG tablet Take 800 mg by mouth every 6 (six) hours as needed for moderate pain.  . SUBOXONE 8-2 MG FILM   . [DISCONTINUED] amphetamine-dextroamphetamine (ADDERALL) 15 MG tablet Take 1 tablet by mouth 2 (two) times daily.  . [DISCONTINUED] amphetamine-dextroamphetamine (ADDERALL) 15 MG tablet  Take 1 tablet by mouth 2 (two) times daily.  . [DISCONTINUED] clonazePAM (KLONOPIN) 0.5 MG tablet Take 1 tablet (0.5 mg total) by mouth 2 (two) times daily.   No facility-administered encounter medications on file as of 04/11/2015.    No results found for this or any previous visit (from the past 2160 hour(s)).  Physical Exam: Constitutional:   BP 110/60 mmHg  Pulse 72  Ht  (1.575 m)  Wt 166 lb 6.4 oz (75.479 kg)  BMI 30.43 kg/m2  Musculoskeletal: Strength & Muscle Tone: within normal limits Gait & Station: normal Patient leans: Patient has a normal posture  Mental Status Examination;  Patient is well groomed and well dressed female.  She maintains good eye contact.  Her speech is clear and coherent.  She describes her mood euthymic and her affect is appropriate. She denies any active or passive suicidal thoughts or homicidal thoughts.  She denies any auditory or visual hallucination.  Her attention and concentration is good.  There were no paranoia or delusions present at this time.  There are no flight of ideas or any loose association.  Her fund of knowledge is adequate.  There are no tremors or shakes present.  She is alert and oriented x3.  Her insight judgment and impulse control disorder.   Established Problem, Stable/Improving (1), Review of Psycho-Social Stressors (1), Review of Last Therapy Session (1) and Review of Medication Regimen & Side Effects (2)  Assessment: Axis I: Attention deficit disorder, anxiety disorder NOS  Axis II: Deferred  Axis III:  Past Medical History  Diagnosis Date  . Arthritis   . Back pain   . Anxiety   . Nerve damage   . ADD (attention deficit disorder)    Plan:  Patient is a stable on her current medication. She is taking Suboxone prescribed by Dr. Tollie Eth for her leg pain.  She has no side effects.  I will continue Adderall XR 15 mg 2 tablet daily and Klonopin 0.5 mg as needed for severe anxiety.  Discussed medication side effects and benefits.  Recommended to call us back if she has any question or any concern.  Follow-up in 3 months.  ARFEEN,SYED T., MD 04/11/2015

## 2015-04-13 MED FILL — SUBOXONE 8 MG-2 MG SL FILM: 8-2 | 7 days supply | Qty: 21 | Fill #0

## 2015-04-20 MED FILL — SUBOXONE 8 MG-2 MG SL FILM: 8-2 | 14 days supply | Qty: 42 | Fill #0

## 2015-05-04 MED FILL — SUBOXONE 8 MG-2 MG SL FILM: 8-2 | 14 days supply | Qty: 42 | Fill #0

## 2015-05-04 MED FILL — PROMETHAZINE 25 MG TABLET: 25 | 7 days supply | Qty: 30 | Fill #0

## 2015-05-16 ENCOUNTER — Telehealth (HOSPITAL_COMMUNITY): Payer: Self-pay

## 2015-05-16 ENCOUNTER — Other Ambulatory Visit (HOSPITAL_COMMUNITY): Payer: Self-pay | Admitting: Psychiatry

## 2015-05-16 NOTE — Telephone Encounter (Signed)
Patient is calling for a refill on her Klonopin, she was last here 1/31 and has a f/u on 5/2. Okay to refill? Please advise, thank you

## 2015-05-16 NOTE — Telephone Encounter (Signed)
She should have enough Klonopin until 3/31.  No early refills.

## 2015-05-16 NOTE — Telephone Encounter (Signed)
I called patient and let her know she will need to wait until the end of march to call me back for refill

## 2015-05-18 MED FILL — SUBOXONE 8 MG-2 MG SL FILM: 8-2 | 30 days supply | Qty: 90 | Fill #0

## 2015-05-24 MED FILL — AMPHETAMINE SALTS 15 MG TAB: 15 | 30 days supply | Qty: 60 | Fill #0

## 2015-06-16 MED FILL — SUBOXONE 8 MG-2 MG SL FILM: 8-2 | 28 days supply | Qty: 84 | Fill #0

## 2015-06-26 ENCOUNTER — Other Ambulatory Visit (HOSPITAL_COMMUNITY): Payer: Self-pay | Admitting: Psychiatry

## 2015-06-26 ENCOUNTER — Telehealth (HOSPITAL_COMMUNITY): Payer: Self-pay

## 2015-06-26 DIAGNOSIS — F411 Generalized anxiety disorder: Secondary | ICD-10-CM

## 2015-06-26 DIAGNOSIS — F988 Other specified behavioral and emotional disorders with onset usually occurring in childhood and adolescence: Secondary | ICD-10-CM

## 2015-06-26 MED ORDER — CLONAZEPAM 0.5 MG PO TABS: 0.5000 mg | ORAL_TABLET | Freq: Two times a day (BID) | ORAL | Status: DC

## 2015-06-26 MED ORDER — AMPHETAMINE-DEXTROAMPHETAMINE 15 MG PO TABS: 15.0000 mg | ORAL_TABLET | Freq: Two times a day (BID) | ORAL | Status: DC

## 2015-06-26 MED FILL — AMPHETAMINE SALTS 15 MG TAB: 15 | 30 days supply | Qty: 60 | Fill #0

## 2015-06-26 MED FILL — clonazePAM 0.5 MG TABS: 0.5 | 30 days supply | Qty: 60 | Fill #0

## 2015-06-26 NOTE — Telephone Encounter (Signed)
Patient is calling for refill on Adderall and Klonopin. Patient was last here on 1/31 and has a follow up on 5/2. Last refilled Adderall on 1/31 with a msg. To not fill until 3/15 and the Klonopin was last filled on 1/31 #30 with one refill. Please review and advise, thank you

## 2015-06-26 NOTE — Telephone Encounter (Signed)
Dr. Lolly MustacheArfeen, printed these prescriptions, I called the patient and let her know that they are ready for pick up

## 2015-06-27 ENCOUNTER — Telehealth (HOSPITAL_COMMUNITY): Payer: Self-pay

## 2015-06-27 NOTE — Telephone Encounter (Signed)
Terri White picked up prescription on 1/61/094/17/17 lic [id number]  dlo

## 2015-07-11 ENCOUNTER — Ambulatory Visit (HOSPITAL_COMMUNITY): Payer: Self-pay | Admitting: Psychiatry

## 2015-07-14 MED FILL — SUBOXONE 8 MG-2 MG SL FILM: 8-2 | 7 days supply | Qty: 21 | Fill #0

## 2015-07-21 MED FILL — SUBOXONE 8 MG-2 MG SL FILM: 8-2 | 7 days supply | Qty: 21 | Fill #0

## 2015-07-24 ENCOUNTER — Ambulatory Visit (INDEPENDENT_AMBULATORY_CARE_PROVIDER_SITE_OTHER): Payer: Medicaid Other | Admitting: Psychiatry

## 2015-07-24 ENCOUNTER — Encounter (HOSPITAL_COMMUNITY): Payer: Self-pay | Admitting: Psychiatry

## 2015-07-24 VITALS — BP 107/82 | HR 85 | Wt 152.0 lb

## 2015-07-24 DIAGNOSIS — F909 Attention-deficit hyperactivity disorder, unspecified type: Secondary | ICD-10-CM | POA: Diagnosis not present

## 2015-07-24 DIAGNOSIS — F411 Generalized anxiety disorder: Secondary | ICD-10-CM | POA: Diagnosis not present

## 2015-07-24 DIAGNOSIS — F988 Other specified behavioral and emotional disorders with onset usually occurring in childhood and adolescence: Secondary | ICD-10-CM

## 2015-07-24 MED ORDER — AMPHETAMINE-DEXTROAMPHETAMINE 15 MG PO TABS: 15.0000 mg | ORAL_TABLET | Freq: Two times a day (BID) | ORAL | Status: DC

## 2015-07-24 MED ORDER — CLONAZEPAM 0.5 MG PO TABS: 0.5000 mg | ORAL_TABLET | Freq: Two times a day (BID) | ORAL | Status: DC

## 2015-07-24 NOTE — Progress Notes (Signed)
[facility] [id number] Progress Note  Terri White [id number] 50 y.o.  07/24/2015 2:16 PM  Chief Complaint:  Medication management and follow-up.   History of Present Illness:  Terri White came for her followup appointment.  She is taking her medication as prescribed.  She is planning to visit [place] in few weeks.  Her attention and focus is good.  She denies any irritability, anger, mood swing.  She is taking Suboxone prescribed by Dr. Tollie Eth for her chronic pain.  Patient feels Adderall helping her ADD.  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  Patient denies any panic attack.  She is concerned about weight gain and she believe Suboxone causing weight gain.  She decided to start regular exercise and she lost weight from the past.  Patient wants to continue her current psychiatric medication.  Patient denies drinking alcohol or using any illegal substances.  She denies any chest pain, dizziness or any insomnia. Her energy level is good.  Her vitals are stable.      Past psychiatric history. Patient has ADD since school age.  She had psychological testing by Dr. Evelene White.  She took Ritalin, Adderall and Vyvanse but stopped working.  Patient denies any history of mania, psychosis, hallucination, agitation or aggressive behavior.  Patient denies any history of suicidal attempt or any inpatient psychiatric treatment.  Patient denies any history of ECT treatment.    Medical history Patient has arthritis.  Her primary care physician is Dr. Abigail White but she usually goes to the emergency room for her basic physical needs.    Suicidal Ideation: No Plan Formed: No Patient has means to carry out plan: No  Homicidal Ideation: No Plan Formed: No Patient has means to carry out plan: No  Review of Systems  Constitutional: Negative.   Cardiovascular: Negative for chest pain and palpitations.  Gastrointestinal: Negative for heartburn.   Musculoskeletal: Positive for back pain.       Leg pain  Skin: Negative.  Negative for itching and rash.  Neurological: Negative for dizziness, tremors and headaches.  Psychiatric/Behavioral: Negative for suicidal ideas and substance abuse.     Psychiatric: Agitation: No Hallucination: No Depressed Mood: No Insomnia: No Hypersomnia: No Altered Concentration: No Feels Worthless: No Grandiose Ideas: No Belief In Special Powers: No New/Increased Substance Abuse: No Compulsions: No  Neurologic: Headache: No Seizure: No Paresthesias: No  Medical History;  See above  Psychosocial History: See above  Outpatient Encounter Prescriptions as of 07/24/2015  Medication Sig  . acetaminophen (TYLENOL) 500 MG tablet Take 1 tablet (500 mg total) by mouth every 6 (six) hours as needed.  [name] amphetamine-dextroamphetamine (ADDERALL) 15 MG tablet Take 1 tablet by mouth 2 (two) times daily.  . clonazePAM (KLONOPIN) 0.5 MG tablet Take 1 tablet (0.5 mg total) by mouth 2 (two) times daily.  [name] ibuprofen (ADVIL,MOTRIN) 200 MG tablet Take 800 mg by mouth every 6 (six) hours as needed for moderate pain.  . SUBOXONE 8-2 MG FILM   . [DISCONTINUED] amphetamine-dextroamphetamine (ADDERALL) 15 MG tablet Take 1 tablet by mouth 2 (two) times daily.  . [DISCONTINUED] amphetamine-dextroamphetamine (ADDERALL) 15 MG tablet Take 1 tablet by mouth 2 (two) times daily.  . [DISCONTINUED] clonazePAM (KLONOPIN) 0.5 MG tablet Take 1 tablet (0.5 mg total) by mouth 2 (two) times daily.   No facility-administered encounter medications on file as of 07/24/2015.    No results found for this or any previous visit (from the  past 2160 hour(s)).  Physical Exam: Constitutional:  BP 107/82 mmHg  Pulse 85  Wt 152 lb (68.947 kg)  Musculoskeletal: Strength & Muscle Tone: within normal limits Gait & Station: normal Patient leans: Patient has a normal posture  Mental Status Examination;  Patient is well groomed and well  dressed female.  She maintains good eye contact.  Her speech is clear and coherent.  She describes her mood euthymic and her affect is appropriate. She denies any active or passive suicidal thoughts or homicidal thoughts.  She denies any auditory or visual hallucination.  Her attention and concentration is good.  There were no paranoia or delusions present at this time.  There are no flight of ideas or any loose association.  Her fund of knowledge is adequate.  There are no tremors or shakes present.  She is alert and oriented x3.  Her insight judgment and impulse control disorder.   Established Problem, Stable/Improving (1), Review of Psycho-Social Stressors (1), Review of Last Therapy Session (1) and Review of Medication Regimen & Side Effects (2)  Assessment: Axis I: Attention deficit disorder, anxiety disorder NOS  Axis II: Deferred  Axis III:  Past Medical History  Diagnosis Date  . Arthritis   . Back pain   . Anxiety   . Nerve damage   . ADD (attention deficit disorder)    Plan:  Patient is a stable on her current medication. She is taking Suboxone prescribed by Dr. Tollie White for her leg pain.  She has no side effects.  I will continue Adderall XR 15 mg 2 tablet daily and Klonopin 0.5 mg as needed for severe anxiety.  Discussed medication side effects and benefits.  Recommended to call us back if she has any question or any concern.  Follow-up in 3 months.  Dale Strausser T., MD 07/24/2015

## 2015-07-26 MED FILL — AMPHETAMINE SALTS 15 MG TAB: 15 | 30 days supply | Qty: 60 | Fill #0

## 2015-07-27 MED FILL — SUBOXONE 8 MG-2 MG SL FILM: 8-2 | 28 days supply | Qty: 84 | Fill #0

## 2015-08-05 IMAGING — CR DG FOOT COMPLETE 3+V*R*
4 series · 4 of 4 positions shown · non-contrast
Comparison: None.

CLINICAL DATA: Severe right foot pain

EXAM:
RIGHT FOOT COMPLETE - 3+ VIEW

[x foot ap right]
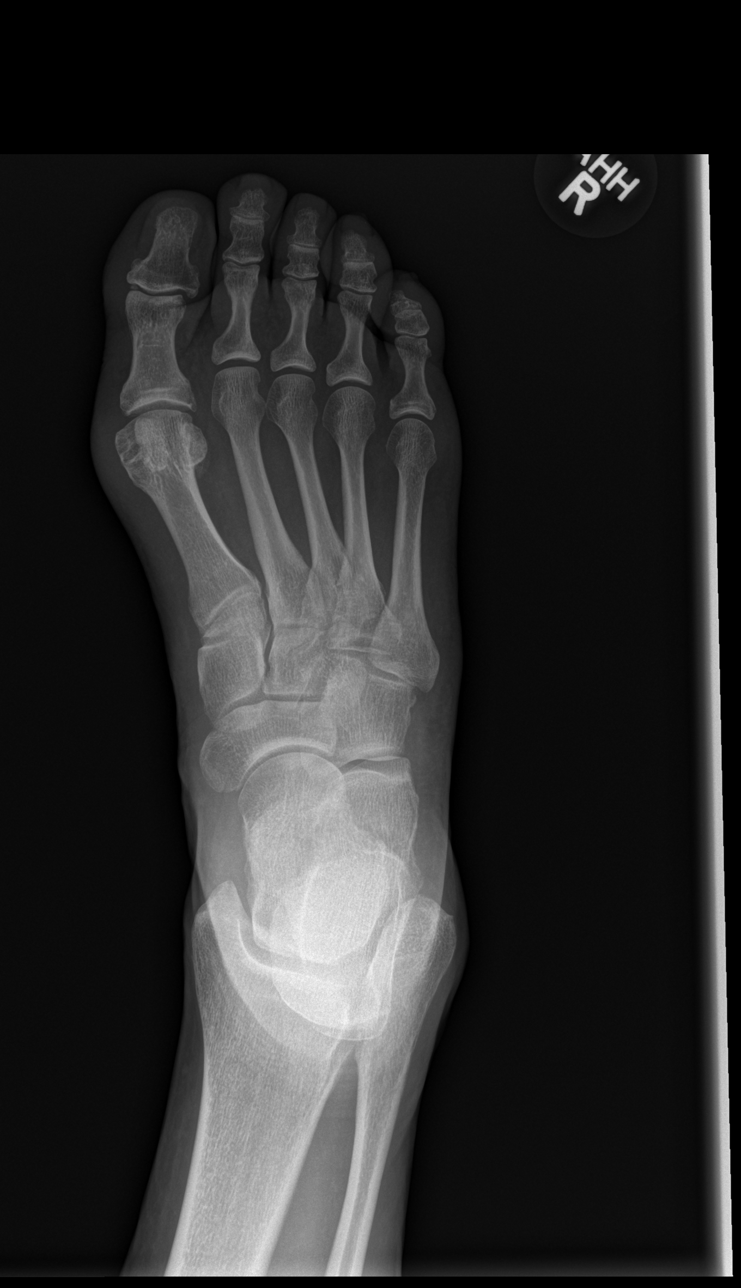

[x foot obl right]
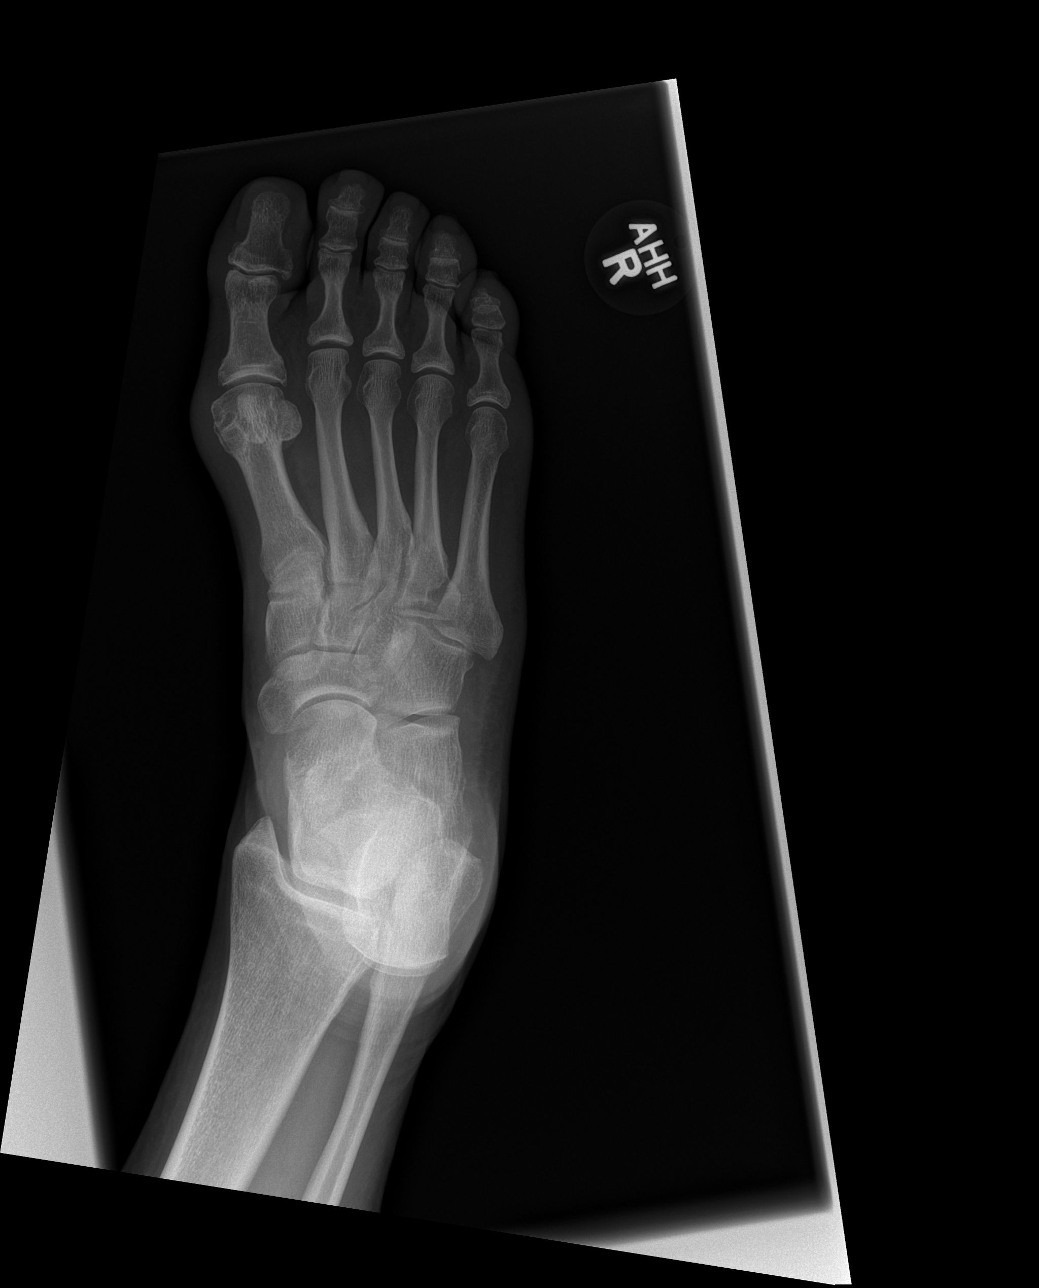

[x foot lat right (1 of 2)]
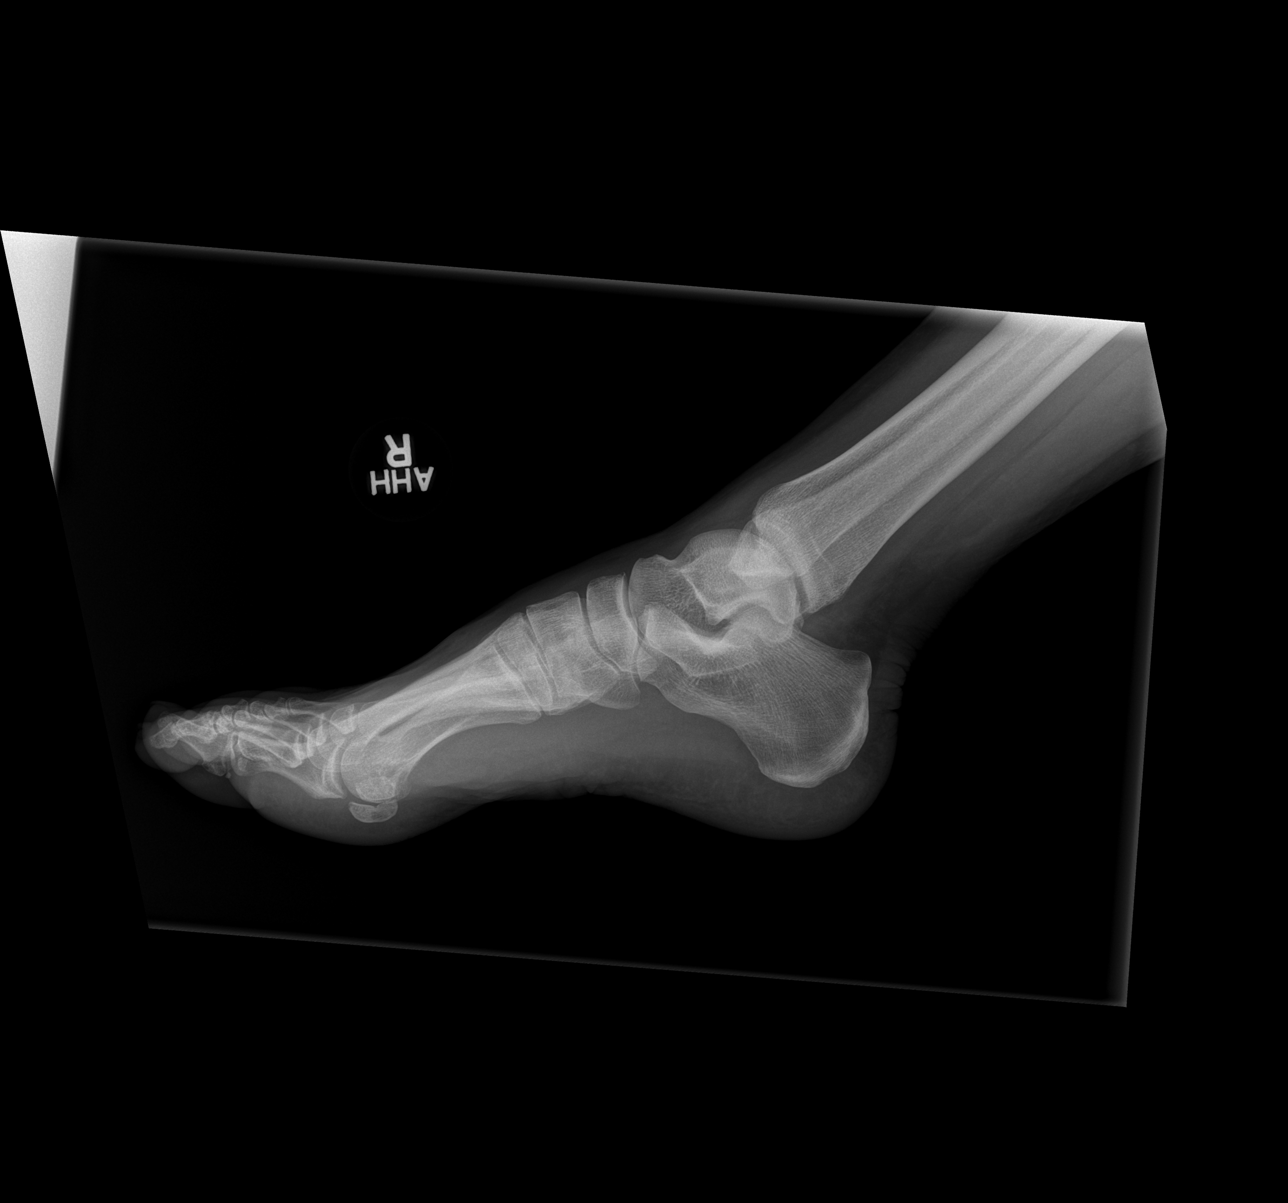

[x foot lat right (2 of 2)]
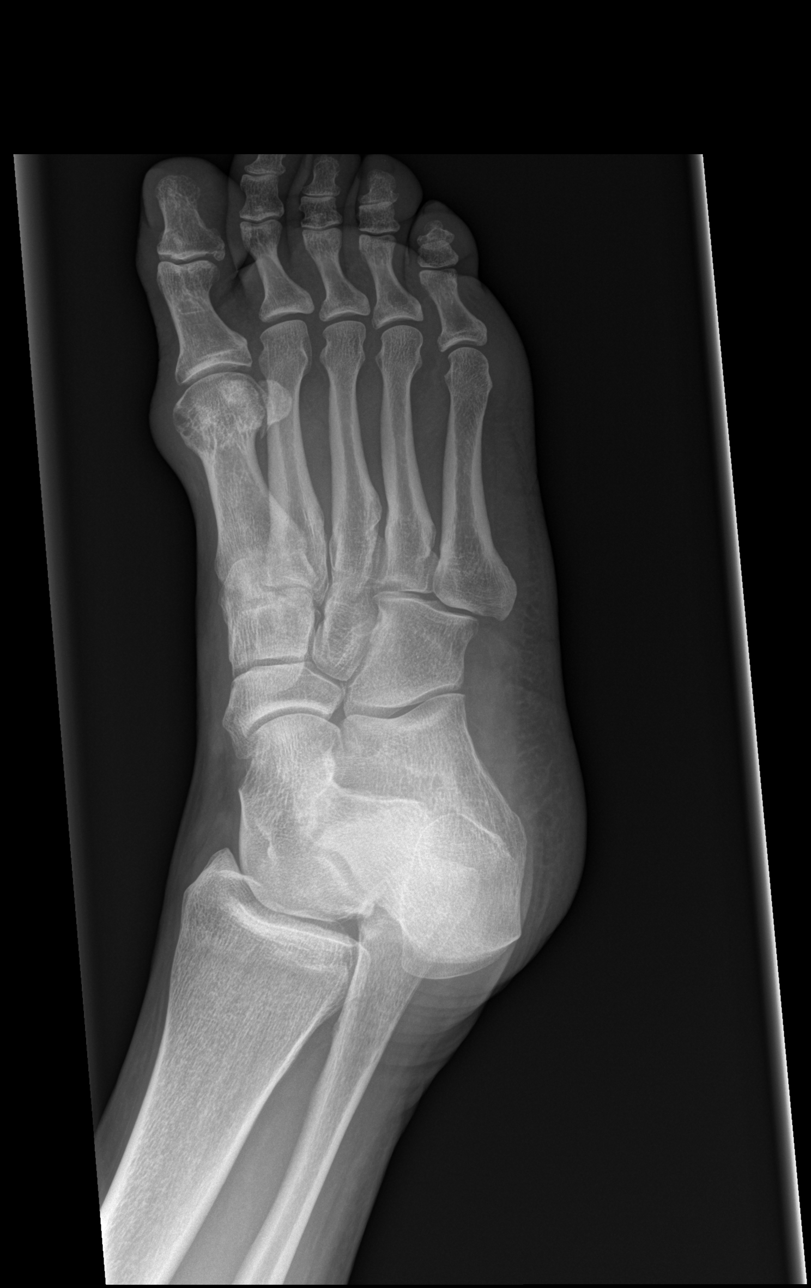

[4 of 4 positions shown; findings below may reference images not displayed]

FINDINGS: There is no evidence of fracture or dislocation. Mild hallux valgus
deformity. There is no evidence of arthropathy or other focal bone
abnormality. Soft tissues are unremarkable.
IMPRESSION: 1. No acute findings.
2. Hallux valgus deformity.

## 2015-08-23 MED FILL — AMPHETAMINE SALTS 15 MG TAB: 15 | 30 days supply | Qty: 60 | Fill #0

## 2015-08-25 MED FILL — SUBOXONE 8 MG-2 MG SL FILM: 8-2 | 28 days supply | Qty: 84 | Fill #0

## 2015-09-22 MED FILL — SUBOXONE 8 MG-2 MG SL FILM: 8-2 | 28 days supply | Qty: 84 | Fill #0

## 2015-09-25 ENCOUNTER — Telehealth (HOSPITAL_COMMUNITY): Payer: Self-pay

## 2015-09-25 ENCOUNTER — Other Ambulatory Visit (HOSPITAL_COMMUNITY): Payer: Self-pay

## 2015-09-25 DIAGNOSIS — F988 Other specified behavioral and emotional disorders with onset usually occurring in childhood and adolescence: Secondary | ICD-10-CM

## 2015-09-25 MED ORDER — AMPHETAMINE-DEXTROAMPHETAMINE 15 MG PO TABS: 15.0000 mg | ORAL_TABLET | Freq: Two times a day (BID) | ORAL | Status: DC

## 2015-09-25 MED FILL — AMPHETAMINE SALTS 15 MG TAB: 15 | 30 days supply | Qty: 60 | Fill #0

## 2015-09-25 NOTE — Telephone Encounter (Signed)
09/25/15 4:46pm Patient came and pick-up rx script [id number].[name].[name]/sh

## 2015-10-20 MED FILL — SUBOXONE 8 MG-2 MG SL FILM: 8-2 | 3 days supply | Qty: 10 | Fill #0

## 2015-10-23 ENCOUNTER — Telehealth (HOSPITAL_COMMUNITY): Payer: Self-pay

## 2015-10-23 DIAGNOSIS — F988 Other specified behavioral and emotional disorders with onset usually occurring in childhood and adolescence: Secondary | ICD-10-CM

## 2015-10-23 DIAGNOSIS — F411 Generalized anxiety disorder: Secondary | ICD-10-CM

## 2015-10-23 NOTE — Telephone Encounter (Signed)
Patient has a follow up with Dr. Lolly MustacheArfeen on 8/22, she will be out of Klonopin and Adderall on 8/17 - okay to do one month? Please review and advise, thank you.

## 2015-10-24 ENCOUNTER — Telehealth (HOSPITAL_COMMUNITY): Payer: Self-pay

## 2015-10-24 ENCOUNTER — Ambulatory Visit (HOSPITAL_COMMUNITY): Payer: Self-pay | Admitting: Psychiatry

## 2015-10-24 MED ORDER — AMPHETAMINE-DEXTROAMPHETAMINE 15 MG PO TABS: 15.0000 mg | ORAL_TABLET | Freq: Two times a day (BID) | ORAL | 0 refills | Status: DC

## 2015-10-24 MED ORDER — CLONAZEPAM 0.5 MG PO TABS: 0.5000 mg | ORAL_TABLET | Freq: Two times a day (BID) | ORAL | 2 refills | Status: DC

## 2015-10-24 MED FILL — clonazePAM 0.5 MG TABS: 0.5 | 30 days supply | Qty: 60 | Fill #0

## 2015-10-24 MED FILL — SUBOXONE 8 MG-2 MG SL FILM: 8-2 | 28 days supply | Qty: 84 | Fill #0

## 2015-10-24 NOTE — Telephone Encounter (Signed)
10/24/15 3:48pm Pt [id number] came and pick-up rx script/sh

## 2015-10-24 NOTE — Telephone Encounter (Signed)
Yes we can refill 

## 2015-10-27 ENCOUNTER — Other Ambulatory Visit (HOSPITAL_COMMUNITY): Payer: Self-pay

## 2015-10-27 DIAGNOSIS — F988 Other specified behavioral and emotional disorders with onset usually occurring in childhood and adolescence: Secondary | ICD-10-CM

## 2015-10-27 DIAGNOSIS — F411 Generalized anxiety disorder: Secondary | ICD-10-CM

## 2015-10-27 MED ORDER — AMPHETAMINE-DEXTROAMPHETAMINE 15 MG PO TABS: 15.0000 mg | ORAL_TABLET | Freq: Two times a day (BID) | ORAL | 0 refills | Status: DC

## 2015-10-27 MED ORDER — CLONAZEPAM 0.5 MG PO TABS: 0.5000 mg | ORAL_TABLET | Freq: Two times a day (BID) | ORAL | 2 refills | Status: DC

## 2015-10-27 NOTE — Progress Notes (Signed)
Patient called and stated her Adderall rx was not signed. I called the pharmacy to confirm and ensure they had destroyed the rx. Dr. Lucianne MussKumar agreed to sign, but was unable to come back to the office. I s/w Shawn T. And he found Dr. Vanetta ShawlHisada upstairs who agreed to sign for the rx. Patient was called and informed she could come and pick up.

## 2015-10-30 ENCOUNTER — Telehealth (HOSPITAL_COMMUNITY): Payer: Self-pay

## 2015-10-30 NOTE — Telephone Encounter (Signed)
Terri White picked up prescription on 1/61/098/18/17 no lic dlo

## 2015-10-31 ENCOUNTER — Ambulatory Visit (HOSPITAL_COMMUNITY): Payer: Self-pay | Admitting: Psychiatry

## 2015-11-23 MED FILL — SUBOXONE 8 MG-2 MG SL FILM: 8-2 | 28 days supply | Qty: 84 | Fill #0

## 2015-11-23 MED FILL — clonazePAM 0.5 MG TABS: 0.5 | 30 days supply | Qty: 60 | Fill #1

## 2015-12-12 ENCOUNTER — Ambulatory Visit (HOSPITAL_COMMUNITY): Payer: Self-pay | Admitting: Psychiatry

## 2015-12-21 MED FILL — SUBOXONE 8 MG-2 MG SL FILM: 8-2 | 28 days supply | Qty: 84 | Fill #0

## 2015-12-22 MED FILL — clonazePAM 0.5 MG TABS: 0.5 | 30 days supply | Qty: 60 | Fill #2

## 2015-12-28 ENCOUNTER — Telehealth (HOSPITAL_COMMUNITY): Payer: Self-pay

## 2015-12-28 NOTE — Telephone Encounter (Signed)
Patient needs to be seen.

## 2015-12-28 NOTE — Telephone Encounter (Signed)
I called patient and she is going to make an appointment now.

## 2015-12-28 NOTE — Telephone Encounter (Signed)
Patient is calling for a refill on Ritalin, she was last here in May and had a no show on 10/3 - there is no follow up currently scheduled. Please review and advise, thank you

## 2015-12-29 ENCOUNTER — Telehealth (HOSPITAL_COMMUNITY): Payer: Self-pay

## 2015-12-29 NOTE — Telephone Encounter (Signed)
Patient is calling to let me know that she has an appointment on 11/29 and she would like to know if she can get a refill on her Adderall now. Patient was denied due to needing to come in for a follow up appt. Please review and advise, thank you

## 2016-01-02 NOTE — Telephone Encounter (Signed)
I attempted to call patient to let her know that she will not be getting the prescription, however the voicemail was full - she will see my missed call and call me back.

## 2016-01-02 NOTE — Telephone Encounter (Signed)
Patient needs to be seen.  She missed appointment.

## 2016-01-11 ENCOUNTER — Encounter (HOSPITAL_COMMUNITY): Payer: Self-pay | Admitting: Psychiatry

## 2016-01-11 ENCOUNTER — Ambulatory Visit (INDEPENDENT_AMBULATORY_CARE_PROVIDER_SITE_OTHER): Payer: Medicaid Other | Admitting: Psychiatry

## 2016-01-11 VITALS — BP 120/70 | HR 103 | Ht 62.0 in | Wt 153.4 lb

## 2016-01-11 DIAGNOSIS — F900 Attention-deficit hyperactivity disorder, predominantly inattentive type: Secondary | ICD-10-CM | POA: Diagnosis not present

## 2016-01-11 DIAGNOSIS — F411 Generalized anxiety disorder: Secondary | ICD-10-CM

## 2016-01-11 DIAGNOSIS — Z79899 Other long term (current) drug therapy: Secondary | ICD-10-CM | POA: Diagnosis not present

## 2016-01-11 MED ORDER — ACETAMINOPHEN 500 MG PO TABS: 500.0000 mg | ORAL_TABLET | Freq: Four times a day (QID) | ORAL | 0 refills | Status: DC | PRN

## 2016-01-11 MED ORDER — AMPHETAMINE-DEXTROAMPHETAMINE 15 MG PO TABS: 15.0000 mg | ORAL_TABLET | Freq: Two times a day (BID) | ORAL | 0 refills | Status: DC

## 2016-01-11 MED ORDER — CLONAZEPAM 0.5 MG PO TABS: ORAL_TABLET | ORAL | 2 refills | Status: DC

## 2016-01-11 MED FILL — AMPHETAMINE SALTS 15 MG TAB: 15 | 30 days supply | Qty: 60 | Fill #0

## 2016-01-11 NOTE — Progress Notes (Signed)
[facility] [id number] Progress Note  Terri White [id number] 50 y.o.  01/11/2016 2:45 PM  Chief Complaint:  Medication management and follow-up.   History of Present Illness:  Terri White came for her followup appointment.  She has been out of her stimulant for past few days because she ran out and could not get earlier appointment.  She admitted poor attention concentration but otherwise she is feeling fine.  She is sleeping good.  She denies any irritability, anger, mania or any psychosis.  She is excited about going to [place] on [place] and then her father is coming on Christmas.  She denies any major panic attack.  She takes Klonopin 1-2 tablet a day as needed which helps her anxiety.  She continues to take Suboxone his prescriber Dr. Tollie EthPlummer for her chronic pain.  Her appetite is okay.  Her vital signs are stable.  Patient denies drinking alcohol or using any illegal substances.  Past psychiatric history. Patient has ADD since school age.  She had psychological testing by Dr. Evelene CroonKaur.  She took Ritalin, Adderall and Vyvanse but stopped working.  Patient denies any history of mania, psychosis, hallucination, agitation or aggressive behavior.  Patient denies any history of suicidal attempt or any inpatient psychiatric treatment.  Patient denies any history of ECT treatment.    Medical history Patient has arthritis.  Her primary care physician is Dr. Abigail Miyamotohacker but she usually goes to the emergency room for her basic physical needs.    Suicidal Ideation: No Plan Formed: No Patient has means to carry out plan: No  Homicidal Ideation: No Plan Formed: No Patient has means to carry out plan: No  Review of Systems  Constitutional: Negative.   Cardiovascular: Negative for chest pain and palpitations.  Gastrointestinal: Negative for heartburn.  Musculoskeletal: Positive for back pain.       Leg pain  Skin: Negative.  Negative for itching and rash.  Neurological: Negative for  dizziness, tremors and headaches.  Psychiatric/Behavioral: Negative for substance abuse and suicidal ideas.     Psychiatric: Agitation: No Hallucination: No Depressed Mood: No Insomnia: No Hypersomnia: No Altered Concentration: No Feels Worthless: No Grandiose Ideas: No Belief In Special Powers: No New/Increased Substance Abuse: No Compulsions: No  Neurologic: Headache: No Seizure: No Paresthesias: No  Medical History;  See above  Psychosocial History: See above  Outpatient Encounter Prescriptions as of 01/11/2016  Medication Sig Dispense Refill  . acetaminophen (TYLENOL) 500 MG tablet Take 1 tablet (500 mg total) by mouth every 6 (six) hours as needed. 30 tablet 0  . amphetamine-dextroamphetamine (ADDERALL) 15 MG tablet Take 1 tablet by mouth 2 (two) times daily. 60 tablet 0  . clonazePAM (KLONOPIN) 0.5 MG tablet Take 1-2 tab daily 45 tablet 2  . ibuprofen (ADVIL,MOTRIN) 200 MG tablet Take 800 mg by mouth every 6 (six) hours as needed for moderate pain.    . SUBOXONE 8-2 MG FILM   0  . [DISCONTINUED] acetaminophen (TYLENOL) 500 MG tablet Take 1 tablet (500 mg total) by mouth every 6 (six) hours as needed. 30 tablet 0  . [DISCONTINUED] amphetamine-dextroamphetamine (ADDERALL) 15 MG tablet Take 1 tablet by mouth 2 (two) times daily. 60 tablet 0  . [DISCONTINUED] amphetamine-dextroamphetamine (ADDERALL) 15 MG tablet Take 1 tablet by mouth 2 (two) times daily. 60 tablet 0  . [DISCONTINUED] amphetamine-dextroamphetamine (ADDERALL) 15 MG tablet Take 1 tablet by mouth 2 (two) times daily. 60 tablet 0  . [DISCONTINUED] clonazePAM (KLONOPIN) 0.5 MG tablet Take 1 tablet (0.5 mg total)  by mouth 2 (two) times daily. 60 tablet 2   No facility-administered encounter medications on file as of 01/11/2016.     No results found for this or any previous visit (from the past 2160 hour(s)).  Physical Exam: Constitutional:  BP 120/70   Pulse (!) 103   Ht 5\' 2"  (1.575 m)   Wt 153 lb 6.4  oz (69.6 kg)   BMI 28.06 kg/m   Musculoskeletal: Strength & Muscle Tone: within normal limits Gait & Station: normal Patient leans: Patient has a normal posture  Mental Status Examination;  Patient is well groomed and well dressed female.  She maintains good eye contact.  Her speech is fast but clear and coherent.  Her attention and focus is distracted at times.  Her mood is euthymic and her affect is appropriate.   She denies any active or passive suicidal thoughts or homicidal thoughts.  She denies any auditory or visual hallucination.  Her attention and concentration is good.  There were no paranoia or delusions present at this time.  There are no flight of ideas or any loose association.  Her fund of knowledge is adequate.  There are no tremors or shakes present.  She is alert and oriented x3.  Her insight judgment and impulse control disorder.   Established Problem, Stable/Improving (1), Review of Psycho-Social Stressors (1), Review or order clinical lab tests (1), Review of Last Therapy Session (1) and Review of Medication Regimen & Side Effects (2)  Assessment: Axis I: Attention deficit disorder, anxiety disorder NOS  Axis II: Deferred  Axis III:  Past Medical History:  Diagnosis Date  . ADD (attention deficit disorder)   . Anxiety   . Arthritis   . Back pain   . Nerve damage    Plan:  Patient is a stable on her current medication. Reinforce medication compliance.  She has no blood work in past one year.  We will order a CBC, CMP, A1c and TSH.  She is taking Suboxone prescribed by Dr. Tollie EthPlummer for her leg pain.  She has no side effects.  I will continue Adderall XR 15 mg 2 tablet daily and Klonopin 0.5 mg 1-2 tablet as needed for severe anxiety.  Discussed medication side effects and benefits.  Recommended to call us back if she has any question or any concern.  Follow-up in 3 months.  Savalas Monje T., MD 01/11/2016

## 2016-01-15 ENCOUNTER — Ambulatory Visit (HOSPITAL_COMMUNITY): Payer: Self-pay | Admitting: Psychiatry

## 2016-01-18 MED FILL — SUBOXONE 8 MG-2 MG SL FILM: 8-2 | 28 days supply | Qty: 84 | Fill #0

## 2016-02-03 ENCOUNTER — Encounter (HOSPITAL_COMMUNITY): Payer: Self-pay | Admitting: Nurse Practitioner

## 2016-02-03 ENCOUNTER — Emergency Department (HOSPITAL_COMMUNITY)
Admission: EM | Admit: 2016-02-03 | Discharge: 2016-02-03 | Disposition: A | Discharge status: Home / Self Care | Payer: Medicaid Other | Attending: Emergency Medicine | Admitting: Emergency Medicine

## 2016-02-03 DIAGNOSIS — F909 Attention-deficit hyperactivity disorder, unspecified type: Secondary | ICD-10-CM | POA: Diagnosis not present

## 2016-02-03 DIAGNOSIS — N750 Cyst of Bartholin's gland: Secondary | ICD-10-CM

## 2016-02-03 DIAGNOSIS — N907 Vulvar cyst: Secondary | ICD-10-CM | POA: Diagnosis present

## 2016-02-03 DIAGNOSIS — Z7982 Long term (current) use of aspirin: Secondary | ICD-10-CM | POA: Diagnosis not present

## 2016-02-03 DIAGNOSIS — F17210 Nicotine dependence, cigarettes, uncomplicated: Secondary | ICD-10-CM | POA: Diagnosis not present

## 2016-02-03 MED ORDER — OXYCODONE-ACETAMINOPHEN 5-325 MG PO TABS
1.0000 | ORAL_TABLET | Freq: Once | ORAL | Status: AC
  Administered 2016-02-03: 1 via ORAL
  Filled 2016-02-03: qty 1

## 2016-02-03 MED ORDER — OXYCODONE-ACETAMINOPHEN 5-325 MG PO TABS: 1.0000 | ORAL_TABLET | ORAL | 0 refills | Status: DC | PRN

## 2016-02-03 MED ORDER — TRAMADOL HCL 50 MG PO TABS: 50.0000 mg | ORAL_TABLET | Freq: Four times a day (QID) | ORAL | 0 refills | Status: DC | PRN

## 2016-02-03 MED ORDER — LIDOCAINE HCL 2 % IJ SOLN
20.0000 mL | Freq: Once | INTRAMUSCULAR | Status: AC
  Administered 2016-02-03: 400 mg
  Filled 2016-02-03: qty 20

## 2016-02-03 NOTE — ED Triage Notes (Signed)
Pt presents to [facility]ED for complaints of a cyst that may have started inside her vagina but now is so large it extends outward. She also complains of chills. She has tried warm compresses without relief.

## 2016-02-03 NOTE — ED Provider Notes (Signed)
[facility]EMERGENCY DEPT Provider Note   CSN: [id number] Arrival date & time: 02/03/16  1459     History   Chief Complaint Chief Complaint  Patient presents with  . Cyst    labia - right    HPI Terri White is a 50 y.o. female.  HPI   50 year old female presents today with cyst to her labia. Patient reports symptoms started 1 week ago with swelling and pain to her right labia. She notes history of the same in the past where a catheter was placed. Patient notes she had discharge from the area but several days ago was unable to express any more discharge. She notes swelling continue to worsen causing severe pain. She denies any pain in her rectum, denies any vaginal bleeding or discharge. Patient denies any fever or chills nausea or vomiting.   Past Medical History:  Diagnosis Date  . ADD (attention deficit disorder)   . Anxiety   . Arthritis   . Back pain   . Nerve damage     There are no active problems to display for this patient.   Past Surgical History:  Procedure Laterality Date  . CESAREAN SECTION    . CESAREAN SECTION  2002 and 2003  . KNEE SURGERY    . KNEE SURGERY     R knee    OB History    Gravida Para Term Preterm AB Living             2   SAB TAB Ectopic Multiple Live Births                   Home Medications    Prior to Admission medications   Medication Sig Start Date End Date Taking? Authorizing Provider  acetaminophen (TYLENOL) 500 MG tablet Take 1 tablet (500 mg total) by mouth every 6 (six) hours as needed. Patient taking differently: Take 500 mg by mouth every 6 (six) hours as needed for mild pain.  01/11/16  Yes Cleotis NipperSyed T Arfeen, MD  amphetamine-dextroamphetamine (ADDERALL) 15 MG tablet Take 1 tablet by mouth 2 (two) times daily. 01/11/16 01/10/17 Yes Cleotis NipperSyed T Arfeen, MD  Aspirin-Acetaminophen-Caffeine (GOODY HEADACHE PO) Take 1 each by mouth daily as needed (pain).   Yes Historical Provider, MD  clonazePAM (KLONOPIN) 0.5 MG tablet Take 1-2 tab  daily Patient taking differently: Take 0.5-1 mg by mouth daily as needed for anxiety. Take 1-2 tab daily 01/11/16  Yes Cleotis NipperSyed T Arfeen, MD  ibuprofen (ADVIL,MOTRIN) 200 MG tablet Take 800 mg by mouth every 6 (six) hours as needed for moderate pain.   Yes Historical Provider, MD  Multiple Vitamin (MULTIVITAMIN WITH MINERALS) TABS tablet Take 1 tablet by mouth daily.   Yes Historical Provider, MD  SUBOXONE 8-2 MG FILM Place 1 each under the tongue 2 (two) times daily.  12/30/14  Yes Historical Provider, MD  traMADol (ULTRAM) 50 MG tablet Take 1 tablet (50 mg total) by mouth every 6 (six) hours as needed. 02/03/16   Eyvonne MechanicJeffrey Osmara Drummonds, PA-C    Family History Family History  Problem Relation Age of Onset  . Osteoarthritis Mother   . Rheum arthritis Father   . ADD / ADHD Sister     Social History Social History  Substance Use Topics  . Smoking status: Current Some Day Smoker    Packs/day: 0.50    Years: 10.00    Types: Cigarettes  . Smokeless tobacco: Never Used  . Alcohol use No     Allergies   Diclofenac  sodium; Pyridium [phenazopyridine hcl]; Sulfa antibiotics; Toradol [ketorolac tromethamine]; and Tramadol   Review of Systems Review of Systems  All other systems reviewed and are negative.    Physical Exam Updated Vital Signs BP 124/68 (BP Location: Left Arm)   Pulse 77   Temp 97.9 F (36.6 C) (Oral)   Resp 18   SpO2 97%   Physical Exam  Constitutional: She is oriented to person, place, and time. She appears well-developed and well-nourished.  HENT:  Head: Normocephalic and atraumatic.  Eyes: Conjunctivae are normal. Pupils are equal, round, and reactive to light. Right eye exhibits no discharge. Left eye exhibits no discharge. No scleral icterus.  Neck: Normal range of motion. No JVD present. No tracheal deviation present.  Pulmonary/Chest: Effort normal. No stridor.  Genitourinary:  Genitourinary Comments: Large Bartholin's abscess to right labia  Neurological: She is  alert and oriented to person, place, and time. Coordination normal.  Psychiatric: She has a normal mood and affect. Her behavior is normal. Judgment and thought content normal.  Nursing note and vitals reviewed.    ED Treatments / Results  Labs (all labs ordered are listed, but only abnormal results are displayed) Labs Reviewed - No data to display  EKG  EKG Interpretation None       Radiology No results found.  Procedures Procedures (including critical care time)  INCISION AND DRAINAGE Performed by: Thermon LeylandHedges,Darcus Edds Todd Consent: Verbal consent obtained. Risks and benefits: risks, benefits and alternatives were discussed Type: abscess  Body area: Right labia  Anesthesia: local infiltration  Incision was made with a scalpel.  Local anesthetic: lidocaine 2% 0 epinephrine  Anesthetic total: 2 ml  Complexity: complex Blunt dissection to break up loculations  Drainage: purulent  Drainage amount: 10 cc  Packing material: Word catheter   Patient tolerance: Patient tolerated the procedure well with no immediate complications.    Medications Ordered in ED Medications  lidocaine (XYLOCAINE) 2 % (with pres) injection 400 mg (not administered)  oxyCODONE-acetaminophen (PERCOCET/ROXICET) 5-325 MG per tablet 1 tablet (1 tablet Oral Given 02/03/16 2013)     Initial Impression / Assessment and Plan / ED Course  I have reviewed the triage vital signs and the nursing notes.  Pertinent labs & imaging results that were available during my care of the patient were reviewed by me and considered in my medical decision making (see chart for details).  Clinical Course      Final Clinical Impressions(s) / ED Diagnoses   Final diagnoses:  Bartholin cyst    Labs:  Imaging:  Consults:  Therapeutics:  Discharge Meds:   Assessment/Plan:  Patient presents today with Bartholin's abscess. 90s performed here with significant relief of symptoms and purulent  discharge. Patient has no signs of spreading infection. Word catheter was placed. Patient will be discharged home on antibiotics, close follow-up with primary care or OB/GYN. Patient is instructed to follow-up next week for reevaluation. Return to the emergency room immediately if she experiences any new or worsening signs or symptoms. Patient verbalized understanding and agreement to today's plan had no further questions or concerns at time of discharge.    New Prescriptions New Prescriptions   TRAMADOL (ULTRAM) 50 MG TABLET    Take 1 tablet (50 mg total) by mouth every 6 (six) hours as needed.     Eyvonne MechanicJeffrey Runette Scifres, PA-C 02/03/16 [id number]    Pricilla LovelessScott Goldston, MD 02/12/16 [phone]

## 2016-02-03 NOTE — Discharge Instructions (Signed)
Please read attached information. If you experience any new or worsening signs or symptoms please return to the emergency room for evaluation. Please follow-up with your primary care provider or specialist as discussed. Please use medication prescribed only as directed and discontinue taking if you have any concerning signs or symptoms.   °

## 2016-02-07 ENCOUNTER — Ambulatory Visit (HOSPITAL_COMMUNITY): Payer: Self-pay | Admitting: Psychiatry

## 2016-02-13 MED FILL — SUBOXONE 8 MG-2 MG SL FILM: 8-2 | 28 days supply | Qty: 84 | Fill #0

## 2016-03-12 MED FILL — SUBOXONE 8 MG-2 MG SL FILM: 8-2 | 28 days supply | Qty: 84 | Fill #0

## 2016-03-12 MED FILL — AMPHETAMINE SALTS 15 MG TAB: 15 | 30 days supply | Qty: 60 | Fill #0

## 2016-04-09 MED FILL — SUBOXONE 8 MG-2 MG SL FILM: 8-2 | 28 days supply | Qty: 84 | Fill #0

## 2016-04-11 ENCOUNTER — Telehealth (HOSPITAL_COMMUNITY): Payer: Self-pay

## 2016-04-11 NOTE — Telephone Encounter (Signed)
Patient is calling to refill Adderall, she has a follow up on 2/8, but will be out tomorrow. Please advise, thank you

## 2016-04-12 ENCOUNTER — Other Ambulatory Visit (HOSPITAL_COMMUNITY): Payer: Self-pay | Admitting: Psychiatry

## 2016-04-12 ENCOUNTER — Telehealth (HOSPITAL_COMMUNITY): Payer: Self-pay | Admitting: Psychiatry

## 2016-04-12 DIAGNOSIS — F900 Attention-deficit hyperactivity disorder, predominantly inattentive type: Secondary | ICD-10-CM

## 2016-04-12 MED ORDER — AMPHETAMINE-DEXTROAMPHETAMINE 15 MG PO TABS: 15.0000 mg | ORAL_TABLET | Freq: Two times a day (BID) | ORAL | 0 refills | Status: DC

## 2016-04-12 NOTE — Telephone Encounter (Signed)
Terri White picked up prescription on 1/6/102/2/18 lic [id number] dlo

## 2016-04-18 ENCOUNTER — Ambulatory Visit (HOSPITAL_COMMUNITY): Payer: Self-pay | Admitting: Psychiatry

## 2016-05-06 NOTE — Telephone Encounter (Signed)
Patient is calling for a refill on her Adderall, she states that she missed her appointment this month due to being sick with the flu, she has rescheduled for this Friday but is out of medication. Please review and advise, thank you

## 2016-05-07 ENCOUNTER — Other Ambulatory Visit (HOSPITAL_COMMUNITY): Payer: Self-pay | Admitting: Psychiatry

## 2016-05-07 MED FILL — SUBOXONE 8 MG-2 MG SL FILM: 8-2 | 28 days supply | Qty: 84 | Fill #0

## 2016-05-10 ENCOUNTER — Encounter (HOSPITAL_COMMUNITY): Payer: Self-pay | Admitting: Psychiatry

## 2016-05-10 ENCOUNTER — Ambulatory Visit (INDEPENDENT_AMBULATORY_CARE_PROVIDER_SITE_OTHER): Payer: Medicaid Other | Admitting: Psychiatry

## 2016-05-10 VITALS — BP 118/72 | HR 64 | Ht 62.0 in | Wt 154.8 lb

## 2016-05-10 DIAGNOSIS — Z79899 Other long term (current) drug therapy: Secondary | ICD-10-CM | POA: Diagnosis not present

## 2016-05-10 DIAGNOSIS — Z882 Allergy status to sulfonamides status: Secondary | ICD-10-CM

## 2016-05-10 DIAGNOSIS — Z888 Allergy status to other drugs, medicaments and biological substances status: Secondary | ICD-10-CM

## 2016-05-10 DIAGNOSIS — F411 Generalized anxiety disorder: Secondary | ICD-10-CM | POA: Diagnosis not present

## 2016-05-10 DIAGNOSIS — F900 Attention-deficit hyperactivity disorder, predominantly inattentive type: Secondary | ICD-10-CM | POA: Diagnosis not present

## 2016-05-10 DIAGNOSIS — F17210 Nicotine dependence, cigarettes, uncomplicated: Secondary | ICD-10-CM

## 2016-05-10 DIAGNOSIS — Z79891 Long term (current) use of opiate analgesic: Secondary | ICD-10-CM

## 2016-05-10 DIAGNOSIS — Z818 Family history of other mental and behavioral disorders: Secondary | ICD-10-CM | POA: Diagnosis not present

## 2016-05-10 MED ORDER — AMPHETAMINE-DEXTROAMPHETAMINE 15 MG PO TABS: 15.0000 mg | ORAL_TABLET | Freq: Two times a day (BID) | ORAL | 0 refills | Status: DC

## 2016-05-10 MED ORDER — CLONAZEPAM 0.5 MG PO TABS: ORAL_TABLET | ORAL | 2 refills | Status: DC

## 2016-05-10 NOTE — Progress Notes (Signed)
[facility] MD/PA/NP OP Progress Note  05/10/2016 11:50 AM Terri White  MRN:  [id number]  Chief Complaint:  Chief Complaint    Follow-up     Subjective:  I have not taken Adderall for few days because I missed appointment.  My attention is not good.  HPI: Terri White came for her follow-up appointment.  She ran out from her stimulant for past few days.  She could not, however her last appointment and apologizes.  Overall she is doing better on her medication.  She sleeping good.  She denies any irritability, anger, mania or any psychosis.  She has no tremors or shakes.  Her anxiety and panic attacks are much better since taking Klonopin 1-2 tablet as needed.  She continues to take Suboxone from her primary care physician Dr. Tollie Eth for chronic pain.  She has been complaining of knee pain but believe medicine working and helping her.  Patient denies drinking alcohol or using any illegal substances.  Her appetite is okay.  Her vital signs are stable.  She is planning to visit her father [place] in April.  Patient also apologizes for not having blood work which was recommended on her last visit.  She promised that she will do the blood work in few days.  Visit Diagnosis:    ICD-9-CM ICD-10-CM   1. Encounter for long-term (current) use of medications V58.69 Z79.899 CBC with Differential/Platelet     COMPLETE METABOLIC PANEL WITH GFR     Hemoglobin A1c  2. Generalized anxiety disorder 300.02 F41.1 clonazePAM (KLONOPIN) 0.5 MG tablet  3. 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 Patient has ADD since school age.  She had psychological testing by Dr. Evelene Croon.  She took Ritalin, Adderall and Vyvanse but stopped working.  Patient denies any history of mania, psychosis, hallucination, agitation or aggressive behavior.  Patient denies any history of suicidal attempt or any  inpatient psychiatric treatment.  Patient denies any history of ECT treatment.    Past Medical History:  Past Medical History:  Diagnosis Date  . ADD (attention deficit disorder)   . Anxiety   . Arthritis   . Back pain   . Nerve damage     Past Surgical History:  Procedure Laterality Date  . CESAREAN SECTION    . CESAREAN SECTION  2002 and 2003  . KNEE SURGERY    . KNEE SURGERY     R knee    Family Psychiatric History: Reviewed.  Family History:  Family History  Problem Relation Age of Onset  . Osteoarthritis Mother   . Rheum arthritis Father   . ADD / ADHD Sister     Social History:  Social History   Social History  . Marital status: Divorced    Spouse name: N/A  . Number of children: N/A  . Years of education: N/A   Social History Main Topics  . Smoking status: Current Some Day Smoker    Packs/day: 0.50    Years: 10.00    Types: Cigarettes  . Smokeless tobacco: Never Used  . Alcohol use No  . Drug use: No     Comment: seldom  . Sexual activity: Yes    Birth control/ protection: None   Other Topics Concern  . None   Social History Narrative  . None    Allergies:  Allergies  Allergen Reactions  . Diclofenac Sodium Nausea And Vomiting    And hives  .  Pyridium [Phenazopyridine Hcl] Nausea And Vomiting  . Sulfa Antibiotics Hives    Itching   . Toradol [Ketorolac Tromethamine] Other (See Comments)    "Made me really sick and have stomach cramps"  . Tramadol Hives and Itching    Metabolic Disorder Labs: No results found for: HGBA1C, MPG No results found for: PROLACTIN No results found for: CHOL, TRIG, HDL, CHOLHDL, VLDL, LDLCALC   Current Medications: Current Outpatient Prescriptions  Medication Sig Dispense Refill  . acetaminophen (TYLENOL) 500 MG tablet Take 1 tablet (500 mg total) by mouth every 6 (six) hours as needed. (Patient taking differently: Take 500 mg by mouth every 6 (six) hours as needed for mild pain. ) 30 tablet 0  .  amphetamine-dextroamphetamine (ADDERALL) 15 MG tablet Take 1 tablet by mouth 2 (two) times daily. 60 tablet 0  . Aspirin-Acetaminophen-Caffeine (GOODY HEADACHE PO) Take 1 each by mouth daily as needed (pain).    . clonazePAM (KLONOPIN) 0.5 MG tablet Take 1-2 tab daily (Patient taking differently: Take 0.5-1 mg by mouth daily as needed for anxiety. Take 1-2 tab daily) 45 tablet 2  . ibuprofen (ADVIL,MOTRIN) 200 MG tablet Take 800 mg by mouth every 6 (six) hours as needed for moderate pain.    . Multiple Vitamin (MULTIVITAMIN WITH MINERALS) TABS tablet Take 1 tablet by mouth daily.    [name]. oxyCODONE-acetaminophen (PERCOCET) 5-325 MG tablet Take 1 tablet by mouth every 4 (four) hours as needed for severe pain. 10 tablet 0  . SUBOXONE 8-2 MG FILM Place 1 each under the tongue 2 (two) times daily.   0   No current facility-administered medications for this visit.     Neurologic: Headache: No Seizure: No Paresthesias: No  Musculoskeletal: Strength & Muscle Tone: within normal limits Gait & Station: normal Patient leans: N/A  Psychiatric Specialty Exam: ROS  Blood pressure 118/72, pulse 64, height 5\' 2"  (1.575 m), weight 154 lb 12.8 oz (70.2 kg).Body mass index is 28.31 kg/m.  General Appearance: Casual  Eye Contact:  Good  Speech:  Clear and Coherent  Volume:  Normal  Mood:  Anxious  Affect:  Congruent  Thought Process:  Goal Directed  Orientation:  Full (Time, Place, and Person)  Thought Content: WDL and Logical   Suicidal Thoughts:  No  Homicidal Thoughts:  No  Memory:  Immediate;   Good Recent;   Good Remote;   Good  Judgement:  Good  Insight:  Good  Psychomotor Activity:  Normal  Concentration:  Concentration: Good and Attention Span: Good  Recall:  Good  Fund of Knowledge: Good  Language: Good  Akathisia:  No  Handed:  Right  AIMS (if indicated):  0  Assets:  Communication Skills Desire for Improvement Housing Physical Health Resilience  ADL's:  Intact  Cognition:  WNL  Sleep:  ok    Assessment: Attention deficit disorder inattentive type.  Anxiety disorder NOS  Plan: Reinforce blood work.  Continue Klonopin 0.5 mg 1-2 tablet as needed for anxiety and Adderall 15 mg 2 tablet daily.  Discussed benzodiazepine and stimulant tolerance and withdrawal and abuse.  Patient has no side effects.  Recommended to call us back if she has any question or any concern.  Follow-up in 3 months.  Alicen Donalson T., MD 05/10/2016, 11:50 AM

## 2016-06-04 MED FILL — SUBOXONE 8 MG-2 MG SL FILM: 8-2 | 7 days supply | Qty: 21 | Fill #0

## 2016-06-10 ENCOUNTER — Ambulatory Visit (HOSPITAL_COMMUNITY): Payer: Self-pay | Admitting: Psychiatry

## 2016-06-10 ENCOUNTER — Telehealth (HOSPITAL_COMMUNITY): Payer: Self-pay | Admitting: Psychiatry

## 2016-06-10 ENCOUNTER — Telehealth (HOSPITAL_COMMUNITY): Payer: Self-pay

## 2016-06-10 DIAGNOSIS — F900 Attention-deficit hyperactivity disorder, predominantly inattentive type: Secondary | ICD-10-CM

## 2016-06-10 MED ORDER — AMPHETAMINE-DEXTROAMPHETAMINE 15 MG PO TABS: 15.0000 mg | ORAL_TABLET | Freq: Two times a day (BID) | ORAL | 0 refills | Status: DC

## 2016-06-10 MED FILL — AMPHETAMINE SALTS 15 MG TAB: 15 | 30 days supply | Qty: 60 | Fill #0

## 2016-06-10 NOTE — Telephone Encounter (Signed)
Patient is calling for a refill on her Adderall, she is a patient of Dr. Lolly Mustache and he is out this week, she has a follow up on 4/18 and her last office visit was 3/2. Is it okay to fill for one month? Please review and advise.

## 2016-06-10 NOTE — Telephone Encounter (Signed)
Prescription printed, I called patient to let her know she could pick it up but the voicemail was full.

## 2016-06-10 NOTE — Telephone Encounter (Signed)
[place] picked up prescription on 05/11/42 lic [id number] dlo

## 2016-06-10 NOTE — Telephone Encounter (Signed)
I reviewed the [facility].  That is fine to refill for 1 month. Thank you.

## 2016-06-11 MED FILL — SUBOXONE 8 MG-2 MG SL FILM: 8-2 | 28 days supply | Qty: 84 | Fill #0

## 2016-06-26 ENCOUNTER — Ambulatory Visit (HOSPITAL_COMMUNITY): Payer: Self-pay | Admitting: Psychiatry

## 2016-07-08 MED FILL — SUBOXONE 8 MG-2 MG SL FILM: 8-2 | 7 days supply | Qty: 21 | Fill #0

## 2016-07-11 ENCOUNTER — Telehealth (HOSPITAL_COMMUNITY): Payer: Self-pay | Admitting: Psychiatry

## 2016-07-11 ENCOUNTER — Other Ambulatory Visit (HOSPITAL_COMMUNITY): Payer: Self-pay | Admitting: Psychiatry

## 2016-07-11 ENCOUNTER — Telehealth (HOSPITAL_COMMUNITY): Payer: Self-pay

## 2016-07-11 DIAGNOSIS — F900 Attention-deficit hyperactivity disorder, predominantly inattentive type: Secondary | ICD-10-CM

## 2016-07-11 MED ORDER — AMPHETAMINE-DEXTROAMPHETAMINE 15 MG PO TABS: 15.0000 mg | ORAL_TABLET | Freq: Two times a day (BID) | ORAL | 0 refills | Status: DC

## 2016-07-11 MED FILL — AMPHETAMINE SALTS 15 MG TAB: 15 | 5 days supply | Qty: 10 | Fill #0

## 2016-07-11 NOTE — Telephone Encounter (Signed)
[place] picked up Terri White on 7/8/295/3/18 lic [id number] dlo

## 2016-07-11 NOTE — Telephone Encounter (Signed)
Patient called for a refill on Adderall, I rescheduled her to 5/8 (next Tues) - she understood that you did not want to refill until the appointment, but wants to know if you can give her just enough to get to her appointment - 5 days worth. Please review and advise, thank you

## 2016-07-12 NOTE — Telephone Encounter (Signed)
Per Dr. Lolly MustacheArfeen a prescription to get patient to next week was given - patient is aware and we verified her appointment time and date.

## 2016-07-15 MED FILL — SUBOXONE 8 MG-2 MG SL FILM: 8-2 | 28 days supply | Qty: 84 | Fill #0

## 2016-07-16 ENCOUNTER — Other Ambulatory Visit (HOSPITAL_COMMUNITY): Payer: Self-pay | Admitting: Psychiatry

## 2016-07-16 ENCOUNTER — Ambulatory Visit (INDEPENDENT_AMBULATORY_CARE_PROVIDER_SITE_OTHER): Payer: Medicaid Other | Admitting: Psychiatry

## 2016-07-16 ENCOUNTER — Encounter (HOSPITAL_COMMUNITY): Payer: Self-pay | Admitting: Psychiatry

## 2016-07-16 DIAGNOSIS — F411 Generalized anxiety disorder: Secondary | ICD-10-CM

## 2016-07-16 DIAGNOSIS — Z818 Family history of other mental and behavioral disorders: Secondary | ICD-10-CM | POA: Diagnosis not present

## 2016-07-16 DIAGNOSIS — Z79899 Other long term (current) drug therapy: Secondary | ICD-10-CM | POA: Diagnosis not present

## 2016-07-16 DIAGNOSIS — F900 Attention-deficit hyperactivity disorder, predominantly inattentive type: Secondary | ICD-10-CM | POA: Diagnosis not present

## 2016-07-16 DIAGNOSIS — F17210 Nicotine dependence, cigarettes, uncomplicated: Secondary | ICD-10-CM | POA: Diagnosis not present

## 2016-07-16 MED ORDER — AMPHETAMINE-DEXTROAMPHETAMINE 15 MG PO TABS: 15.0000 mg | ORAL_TABLET | Freq: Two times a day (BID) | ORAL | 0 refills | Status: DC

## 2016-07-16 MED ORDER — CLONAZEPAM 0.5 MG PO TABS: ORAL_TABLET | ORAL | 2 refills | Status: DC

## 2016-07-16 MED FILL — AMPHETAMINE SALTS 15 MG TAB: 15 | 30 days supply | Qty: 60 | Fill #0

## 2016-07-16 NOTE — Progress Notes (Signed)
BH MD/PA/NP OP Progress Note  07/16/2016 10:13 AM Terri White  MRN:  [id number]  Chief Complaint:  Subjective:  I apologize missing my appointment.  HPI: Terri White came for her follow-up appointment.  She missed last appointment because she was sick.  She is having allergies and congestion.  Overall she described her mood is good.  She is compliant with her medication.  She is able to do multitasking and denies any tremors or shakes.  She denies any mania, psychosis or any insomnia.  Her panic attacks are much better on Klonopin.  Her attention and concentration is good and she is able to do multitasking.  Her son is getting 25 year old in June and she is excited because his father may come from [place] to visit them.  Patient did not do the blood work because she went to the hospital for lab work and she was told to go to laboratory.  Patient denies drinking alcohol or using any illegal substances.  She is taking Klonopin 1-2 tablet as needed.  She continues to take Suboxone from her primary care physician Dr. Tollie Eth for chronic pain.  Her appetite is okay.  Her vital signs are stable.  Visit Diagnosis:    ICD-9-CM ICD-10-CM   1. Generalized anxiety disorder 300.02 F41.1 clonazePAM (KLONOPIN) 0.5 MG tablet  2. 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 Patient has ADD since school age. She had psychological testing by Dr. Evelene Croon. She took Ritalin, Adderall and Vyvanse but stopped working. Patient denies any history of mania, psychosis, hallucination, agitation or aggressive behavior. Patient denies any history of suicidal attempt or any inpatient psychiatric treatment. Patient denies any history of ECT  treatment.  Past Medical History:  Past Medical History:  Diagnosis Date  . ADD (attention deficit disorder)   . Anxiety   . Arthritis   . Back pain   . Nerve damage     Past Surgical History:  Procedure Laterality Date  . CESAREAN SECTION    . CESAREAN SECTION  2002 and 2003  . KNEE SURGERY    . KNEE SURGERY     R knee    Family Psychiatric History: Reviewed.  Family History:  Family History  Problem Relation Age of Onset  . Osteoarthritis Mother   . Rheum arthritis Father   . ADD / ADHD Sister     Social History:  Social History   Social History  . Marital status: Divorced    Spouse name: N/A  . Number of children: N/A  . Years of education: N/A   Social History Main Topics  . Smoking status: Current Some Day Smoker    Packs/day: 0.50    Years: 10.00    Types: Cigarettes  . Smokeless tobacco: Never Used  . Alcohol use No  . Drug use: No     Comment: seldom  . Sexual activity: Yes    Birth control/ protection: None   Other Topics Concern  . None   Social History Narrative  . None    Allergies:  Allergies  Allergen Reactions  . Diclofenac Sodium Nausea And Vomiting    And hives  . Pyridium [Phenazopyridine Hcl] Nausea And Vomiting  . Sulfa Antibiotics Hives    Itching   . Toradol [Ketorolac Tromethamine] Other (See Comments)    "Made  me really sick and have stomach cramps"  . Tramadol Hives and Itching    Metabolic Disorder Labs: No results found for: HGBA1C, MPG No results found for: PROLACTIN No results found for: CHOL, TRIG, HDL, CHOLHDL, VLDL, LDLCALC   Current Medications: Current Outpatient Prescriptions  Medication Sig Dispense Refill  . acetaminophen (TYLENOL) 500 MG tablet Take 1 tablet (500 mg total) by mouth every 6 (six) hours as needed. (Patient taking differently: Take 500 mg by mouth every 6 (six) hours as needed for mild pain. ) 30 tablet 0  . amphetamine-dextroamphetamine (ADDERALL) 15 MG tablet Take 1 tablet by mouth 2  (two) times daily. 10 tablet 0  . clonazePAM (KLONOPIN) 0.5 MG tablet Take 1-2 tab daily 45 tablet 2  . ibuprofen (ADVIL,MOTRIN) 200 MG tablet Take 800 mg by mouth every 6 (six) hours as needed for moderate pain.    . Multiple Vitamin (MULTIVITAMIN WITH MINERALS) TABS tablet Take 1 tablet by mouth daily.    . SUBOXONE 8-2 MG FILM Place 1 each under the tongue 2 (two) times daily.   0   No current facility-administered medications for this visit.     Neurologic: Headache: No Seizure: No Paresthesias: No  Musculoskeletal: Strength & Muscle Tone: within normal limits Gait & Station: normal Patient leans: N/A  Psychiatric Specialty Exam: ROS  Blood pressure 110/64, pulse 94, height 5' (1.524 m), weight 161 lb 3.2 oz (73.1 kg).Body mass index is 31.48 kg/m.  General Appearance: Casual and Well Groomed  Eye Contact:  Good  Speech:  Clear and Coherent  Volume:  Normal  Mood:  Euthymic  Affect:  Congruent  Thought Process:  Goal Directed  Orientation:  Full (Time, Place, and Person)  Thought Content: WDL and Logical   Suicidal Thoughts:  No  Homicidal Thoughts:  No  Memory:  Immediate;   Good Recent;   Good Remote;   Good  Judgement:  Good  Insight:  Good  Psychomotor Activity:  Normal  Concentration:  Concentration: Good and Attention Span: Good  Recall:  Good  Fund of Knowledge: Good  Language: Good  Akathisia:  No  Handed:  Right  AIMS (if indicated):  0  Assets:  Communication Skills Desire for Improvement Housing Physical Health Resilience  ADL's:  Intact  Cognition: WNL  Sleep:  Good     Assessment: Attention deficit disorder inattentive type.  Anxiety disorder NOS.  Plan: Patient is doing better on her medication.  Continue Klonopin 0.5 mg 1-2 tablet as needed for anxiety and panic attacks.  Continue Adderall 15 mg 2 tablet daily.  Discussed benzodiazepine and the stimulants tolerance and withdrawal.  Patient will do blood work in the clinic.  We will order  a CBC, CMP, hemoglobin A1c and TSH.  Discussed medication side effects and recommended to call us back if she has any question or any concern.  Follow-up in 3 months.  Terri Blow T., MD 07/16/2016, 10:13 AM

## 2016-07-17 LAB — CBC WITH DIFFERENTIAL/PLATELET
Basophils Absolute: 0 10*3/uL (ref 0.0–0.2)
Basos: 0 %
EOS (ABSOLUTE): 0.3 10*3/uL (ref 0.0–0.4)
Eos: 5 %
Hematocrit: 40.5 % (ref 34.0–46.6)
Hemoglobin: 13.3 g/dL (ref 11.1–15.9)
IMMATURE GRANULOCYTES: 0 %
Immature Grans (Abs): 0 10*3/uL (ref 0.0–0.1)
Lymphocytes Absolute: 1.6 10*3/uL (ref 0.7–3.1)
Lymphs: 24 %
MCH: 31.2 pg (ref 26.6–33.0)
MCHC: 32.8 g/dL (ref 31.5–35.7)
MCV: 95 fL (ref 79–97)
MONOS ABS: 0.5 10*3/uL (ref 0.1–0.9)
Monocytes: 7 %
NEUTROS PCT: 64 %
Neutrophils Absolute: 4 10*3/uL (ref 1.4–7.0)
PLATELETS: 407 10*3/uL — AB (ref 150–379)
RBC: 4.26 x10E6/uL (ref 3.77–5.28)
RDW: 14.3 % (ref 12.3–15.4)
WBC: 6.3 10*3/uL (ref 3.4–10.8)

## 2016-07-17 LAB — HGB A1C W/O EAG: HEMOGLOBIN A1C: 5.1 % (ref 4.8–5.6)

## 2016-07-17 LAB — COMPREHENSIVE METABOLIC PANEL

## 2016-07-17 LAB — SPECIMEN STATUS REPORT

## 2016-08-08 MED FILL — SUBOXONE 8 MG-2 MG SL FILM: 8-2 | 28 days supply | Qty: 84 | Fill #0

## 2016-09-05 MED FILL — SUBOXONE 8 MG-2 MG SL FILM: 8-2 | 28 days supply | Qty: 84 | Fill #0

## 2016-09-12 MED FILL — AMPHETAMINE SALTS 15 MG TAB: 15 | 30 days supply | Qty: 60 | Fill #0

## 2016-10-03 MED FILL — SUBOXONE 8 MG-2 MG SL FILM: 8-2 | 14 days supply | Qty: 42 | Fill #0

## 2016-10-09 ENCOUNTER — Other Ambulatory Visit (HOSPITAL_COMMUNITY): Payer: Self-pay | Admitting: Psychiatry

## 2016-10-09 DIAGNOSIS — F411 Generalized anxiety disorder: Secondary | ICD-10-CM

## 2016-10-10 ENCOUNTER — Telehealth (HOSPITAL_COMMUNITY): Payer: Self-pay

## 2016-10-10 ENCOUNTER — Other Ambulatory Visit (HOSPITAL_COMMUNITY): Payer: Self-pay | Admitting: Psychiatry

## 2016-10-10 DIAGNOSIS — F900 Attention-deficit hyperactivity disorder, predominantly inattentive type: Secondary | ICD-10-CM

## 2016-10-10 NOTE — Telephone Encounter (Signed)
This is Arfeen's pt 

## 2016-10-10 NOTE — Telephone Encounter (Signed)
Patient is calling for a refill on her Adderall, she is scheduled to see you on 8/16. Please review and advise, thank you

## 2016-10-11 ENCOUNTER — Other Ambulatory Visit (HOSPITAL_COMMUNITY): Payer: Self-pay | Admitting: Psychiatry

## 2016-10-11 ENCOUNTER — Other Ambulatory Visit (HOSPITAL_COMMUNITY): Payer: Self-pay

## 2016-10-11 DIAGNOSIS — F900 Attention-deficit hyperactivity disorder, predominantly inattentive type: Secondary | ICD-10-CM

## 2016-10-11 DIAGNOSIS — F411 Generalized anxiety disorder: Secondary | ICD-10-CM

## 2016-10-11 MED ORDER — AMPHETAMINE-DEXTROAMPHETAMINE 15 MG PO TABS: 15.0000 mg | ORAL_TABLET | Freq: Two times a day (BID) | ORAL | 0 refills | Status: DC

## 2016-10-11 MED ORDER — CLONAZEPAM 0.5 MG PO TABS: ORAL_TABLET | ORAL | 0 refills | Status: AC

## 2016-10-14 ENCOUNTER — Other Ambulatory Visit (HOSPITAL_COMMUNITY): Payer: Self-pay | Admitting: Psychiatry

## 2016-10-17 MED FILL — SUBOXONE 8 MG-2 MG SL FILM: 8-2 | 28 days supply | Qty: 84 | Fill #0

## 2016-10-24 ENCOUNTER — Ambulatory Visit (HOSPITAL_COMMUNITY): Payer: Self-pay | Admitting: Psychiatry

## 2016-11-08 ENCOUNTER — Telehealth (HOSPITAL_COMMUNITY): Payer: Self-pay

## 2016-11-08 DIAGNOSIS — F900 Attention-deficit hyperactivity disorder, predominantly inattentive type: Secondary | ICD-10-CM

## 2016-11-08 NOTE — Telephone Encounter (Signed)
Patient has an appointment in September, but needs refill on Adderall now, she is out as of 9/2. Please review and advise, thank you

## 2016-11-12 ENCOUNTER — Telehealth (HOSPITAL_COMMUNITY): Payer: Self-pay | Admitting: Psychiatry

## 2016-11-12 MED ORDER — AMPHETAMINE-DEXTROAMPHETAMINE 15 MG PO TABS: 15.0000 mg | ORAL_TABLET | Freq: Two times a day (BID) | ORAL | 0 refills | Status: AC

## 2016-11-12 NOTE — Telephone Encounter (Signed)
Printed and called patient to let her know it was ready for pick up 

## 2016-11-12 NOTE — Telephone Encounter (Signed)
Terri White picked up prescription on 4/0/989/4/18 lic [id number]

## 2016-11-14 MED FILL — SUBOXONE 8 MG-2 MG SL FILM: 8-2 | 28 days supply | Qty: 84 | Fill #0

## 2016-11-25 MED FILL — PROMETHAZINE 25 MG TABLET: 25 | 20 days supply | Qty: 60 | Fill #0

## 2016-11-26 ENCOUNTER — Ambulatory Visit (HOSPITAL_COMMUNITY): Payer: Self-pay | Admitting: Psychiatry

## 2016-12-03 ENCOUNTER — Other Ambulatory Visit (HOSPITAL_COMMUNITY): Payer: Self-pay | Admitting: *Deleted

## 2016-12-03 NOTE — Telephone Encounter (Signed)
Patient called for refill of Adderall. Upon chart review patient has no future appointment scheduled. Message left by patient states her next appointment is 12/09/16 however it was noticed that it had been canceled with message that patient has been dismissed by provider. Last filled 11/12/16. Will clarify with Dr Lolly Mustache if he would like refill to be given.

## 2016-12-03 NOTE — Telephone Encounter (Signed)
Patient is terminated due to multiple no shows and late cancellation.

## 2016-12-04 NOTE — Telephone Encounter (Signed)
Medication management - Attempted to reach patient to inform she had been discharged from services as this was again discussed with Dr. Lolly Mustache but patient did not answer and message stated her voicemail had not been set up.

## 2016-12-04 NOTE — Telephone Encounter (Signed)
Medication management - Patient came into the office and informed her Dr. Lolly Mustache had a letter sent to her to discharge her from services due to history of no shows and cancellations.  Patient was not happy but stated understanding.  No further medications provided to patient at this time.

## 2016-12-09 ENCOUNTER — Ambulatory Visit (HOSPITAL_COMMUNITY): Payer: Self-pay | Admitting: Psychiatry

## 2016-12-12 MED FILL — SUBOXONE 8 MG-2 MG SL FILM: 8-2 | 14 days supply | Qty: 42 | Fill #0

## 2016-12-26 MED FILL — SUBOXONE 8 MG-2 MG SL FILM: 8-2 | 28 days supply | Qty: 84 | Fill #0

## 2017-01-23 MED FILL — SUBOXONE 8 MG-2 MG SL FILM: 8-2 | 28 days supply | Qty: 84 | Fill #0

## 2017-02-19 MED FILL — SUBOXONE 8 MG-2 MG SL FILM: 8-2 | 7 days supply | Qty: 21 | Fill #0

## 2017-02-27 MED FILL — SUBOXONE 8 MG-2 MG SL FILM: 8-2 | 28 days supply | Qty: 84 | Fill #0

## 2017-03-27 MED FILL — SUBOXONE 8 MG-2 MG SL FILM: 8-2 | 28 days supply | Qty: 84 | Fill #0

## 2017-04-24 MED FILL — SUBOXONE 8 MG-2 MG SL FILM: 8-2 | 28 days supply | Qty: 84 | Fill #0

## 2017-05-22 MED FILL — PROMETHAZINE 25 MG TABLET: 25 | 5 days supply | Qty: 15 | Fill #0

## 2017-05-22 MED FILL — CloNIDine HCL 0.1 MG TAB: 0.1 | 5 days supply | Qty: 10 | Fill #0

## 2017-05-27 MED FILL — SUBOXONE 8 MG-2 MG SL FILM: 8-2 | 28 days supply | Qty: 84 | Fill #0

## 2017-06-24 MED FILL — SUBOXONE 8 MG-2 MG SL FILM: 8-2 | 28 days supply | Qty: 84 | Fill #0

## 2017-07-22 MED FILL — SUBOXONE 8 MG-2 MG SL FILM: 8-2 | 16 days supply | Qty: 48 | Fill #0

## 2017-08-02 ENCOUNTER — Other Ambulatory Visit: Payer: Self-pay

## 2017-08-02 ENCOUNTER — Encounter (HOSPITAL_COMMUNITY): Payer: Self-pay | Admitting: Emergency Medicine

## 2017-08-02 ENCOUNTER — Emergency Department (HOSPITAL_COMMUNITY): Payer: Medicaid Other

## 2017-08-02 ENCOUNTER — Emergency Department (HOSPITAL_COMMUNITY)
Admission: EM | Admit: 2017-08-02 | Discharge: 2017-08-02 | Disposition: A | Discharge status: Home / Self Care | Payer: Medicaid Other | Attending: Emergency Medicine | Admitting: Emergency Medicine

## 2017-08-02 DIAGNOSIS — F17210 Nicotine dependence, cigarettes, uncomplicated: Secondary | ICD-10-CM | POA: Diagnosis not present

## 2017-08-02 DIAGNOSIS — J40 Bronchitis, not specified as acute or chronic: Secondary | ICD-10-CM | POA: Diagnosis not present

## 2017-08-02 DIAGNOSIS — R05 Cough: Secondary | ICD-10-CM | POA: Diagnosis present

## 2017-08-02 DIAGNOSIS — F419 Anxiety disorder, unspecified: Secondary | ICD-10-CM | POA: Diagnosis not present

## 2017-08-02 DIAGNOSIS — Z79899 Other long term (current) drug therapy: Secondary | ICD-10-CM | POA: Insufficient documentation

## 2017-08-02 MED ORDER — PREDNISONE 20 MG PO TABS: 40.0000 mg | ORAL_TABLET | Freq: Every day | ORAL | 0 refills | Status: DC

## 2017-08-02 MED ORDER — LORAZEPAM 1 MG PO TABS
1.0000 mg | ORAL_TABLET | Freq: Once | ORAL | Status: AC
  Administered 2017-08-02: 1 mg via ORAL
  Filled 2017-08-02: qty 1

## 2017-08-02 MED ORDER — HYDROXYZINE HCL 25 MG PO TABS: 25.0000 mg | ORAL_TABLET | Freq: Four times a day (QID) | ORAL | 0 refills | Status: AC

## 2017-08-02 MED ORDER — ALBUTEROL SULFATE HFA 108 (90 BASE) MCG/ACT IN AERS
2.0000 | INHALATION_SPRAY | Freq: Once | RESPIRATORY_TRACT | Status: AC
  Administered 2017-08-02: 2 via RESPIRATORY_TRACT
  Filled 2017-08-02: qty 6.7

## 2017-08-02 NOTE — ED Provider Notes (Signed)
[facility]-EMERGENCY DEPT Provider Note   CSN: [id number] Arrival date & time: 08/02/17  0433     History   Chief Complaint Chief Complaint  Patient presents with  . Panic Attack    HPI Corneisha Alvi is a 52 y.o. female.  HPI Passion Lavin is a 52 y.o. female presents to emergency department complaining of anxiety and cough.  Patient states she has had cough for 2 days with green productive sputum.  Denies any shortness of breath.  No fever or chills.  She states that she has been off of her Klonopin for about 6 months, states she used to go to behavioral health and had dismissed for missing too many appointments.  She states that recently she has had increased anxiety because her mother is sick in [place].  She states she is crying every day.  She feels very anxious, she is unable to perform daily activity due to anxiety.  She states "I literally feel like I am going to die sometimes."  She states the fact that she does not have any anxiety medications make her even more anxious.  She has an appointment with [facility] on 5 June, in 10 days.  Past Medical History:  Diagnosis Date  . ADD (attention deficit disorder)   . Anxiety   . Arthritis   . Back pain   . Nerve damage     There are no active problems to display for this patient.   Past Surgical History:  Procedure Laterality Date  . CESAREAN SECTION    . CESAREAN SECTION  2002 and 2003  . KNEE SURGERY    . KNEE SURGERY     R knee     OB History    Gravida      Para      Term      Preterm      AB      Living  2     SAB      TAB      Ectopic      Multiple      Live Births               Home Medications    Prior to Admission medications   Medication Sig Start Date End Date Taking? Authorizing Provider  acetaminophen (TYLENOL) 500 MG tablet Take 1 tablet (500 mg total) by mouth every 6 (six) hours as needed. Patient taking differently: Take 500 mg by mouth  every 6 (six) hours as needed for mild pain.  01/11/16   Arfeen, Phillips Grout, MD  amphetamine-dextroamphetamine (ADDERALL) 15 MG tablet Take 1 tablet by mouth 2 (two) times daily. 11/12/16 11/12/17  Arfeen, Phillips Grout, MD  clonazePAM (KLONOPIN) 0.5 MG tablet Take 1-2 tab daily 10/11/16   Arfeen, Phillips Grout, MD  ibuprofen (ADVIL,MOTRIN) 200 MG tablet Take 800 mg by mouth every 6 (six) hours as needed for moderate pain.    [provider]  Multiple Vitamin (MULTIVITAMIN WITH MINERALS) TABS tablet Take 1 tablet by mouth daily.    [provider]  SUBOXONE 8-2 MG FILM Place 1 each under the tongue 2 (two) times daily.  12/30/14   [provider]    Family History Family History  Problem Relation Age of Onset  . Osteoarthritis Mother   . Rheum arthritis Father   . ADD / ADHD Sister     Social History Social History   Tobacco Use  . Smoking status: Current Some Day  Smoker    Packs/day: 0.50    Years: 10.00    Pack years: 5.00    Types: Cigarettes  . Smokeless tobacco: Never Used  Substance Use Topics  . Alcohol use: No    Alcohol/week: 0.0 oz  . Drug use: No    Comment: seldom     Allergies   Diclofenac sodium; Pyridium [phenazopyridine hcl]; Sulfa antibiotics; Toradol [ketorolac tromethamine]; and Tramadol   Review of Systems Review of Systems  Constitutional: Negative for chills and fever.  HENT: Negative for congestion and sore throat.   Respiratory: Positive for cough. Negative for chest tightness and shortness of breath.   Cardiovascular: Negative for chest pain, palpitations and leg swelling.  Gastrointestinal: Negative for abdominal pain, diarrhea, nausea and vomiting.  Genitourinary: Negative for dysuria, flank pain, pelvic pain, vaginal bleeding, vaginal discharge and vaginal pain.  Musculoskeletal: Negative for arthralgias, myalgias, neck pain and neck stiffness.  Skin: Negative for rash.  Neurological: Negative for dizziness, weakness and headaches.    Psychiatric/Behavioral: Positive for dysphoric mood. The patient is nervous/anxious.   All other systems reviewed and are negative.    Physical Exam Updated Vital Signs BP (!) 124/109 (BP Location: Right Arm)   Pulse (!) 101   Temp 98.2 F (36.8 C) (Oral)   Resp (!) 28   LMP 10/28/2014 (Approximate)   SpO2 92%   Physical Exam  Constitutional: She appears well-developed and well-nourished. No distress.  HENT:  Head: Normocephalic.  Eyes: Conjunctivae are normal.  Neck: Neck supple.  Cardiovascular: Normal rate, regular rhythm and normal heart sounds.  Pulmonary/Chest: Effort normal. No respiratory distress. She has wheezes. She has no rales.  Inspiratory and expiratory wheezes bilaterally  Abdominal: Soft. Bowel sounds are normal. She exhibits no distension. There is no tenderness. There is no rebound.  Musculoskeletal: She exhibits no edema.  Neurological: She is alert.  Skin: Skin is warm and dry.  Psychiatric: She has a normal mood and affect. Her behavior is normal.  Anxious appearing, tearful  Nursing note and vitals reviewed.    ED Treatments / Results  Labs (all labs ordered are listed, but only abnormal results are displayed) Labs Reviewed - No data to display  EKG None  Radiology Dg Chest 2 View  Result Date: 08/02/2017 CLINICAL DATA:  Cough.  Panic attack. EXAM: CHEST - 2 VIEW COMPARISON:  04/09/2014 FINDINGS: Mild pectus excavatum deformity. Midline trachea. Normal heart size and mediastinal contours. No pleural effusion or pneumothorax. Diffuse peribronchial thickening. Clear lungs. IMPRESSION: No acute cardiopulmonary disease. Peribronchial thickening which may relate to chronic bronchitis or smoking. Electronically Signed   By: Jeronimo Greaves M.D.   On: 08/02/2017 07:47    Procedures Procedures (including critical care time)  Medications Ordered in ED Medications  LORazepam (ATIVAN) tablet 1 mg (has no administration in time range)  albuterol  (PROVENTIL HFA;VENTOLIN HFA) 108 (90 Base) MCG/ACT inhaler 2 puff (has no administration in time range)     Initial Impression / Assessment and Plan / ED Course  I have reviewed the triage vital signs and the nursing notes.  Pertinent labs & imaging results that were available during my care of the patient were reviewed by me and considered in my medical decision making (see chart for details).     Pt in ED with cough, wheezing, also anxiety. She is tearful. Will give inhaler for wheezing, her oxygen sat is 92 on RA, will recheck. CXR to ro pneumonia. Ativan given for anxiety  8:33 AM Patient will  be better after an inhaler and Ativan.  Chest x-ray showing chronic bronchitic changes.  We discussed smoking cessation.  We will give her 5 days burst of prednisone, and inhaler for her bronchitis.  Explained I will not be prescribing her any benzodiazepines.  She knew will need to follow-up with her doctor to get that prescription.  I can give her prescription for Vistaril for now.  We will have her follow-up as scheduled.  Return precautions discussed.  Vitals:   08/02/17 0452  BP: (!) 124/109  Pulse: (!) 101  Resp: (!) 28  Temp: 98.2 F (36.8 C)  TempSrc: Oral  SpO2: 92%     Final Clinical Impressions(s) / ED Diagnoses   Final diagnoses:  Anxiety  Bronchitis    ED Discharge Orders        Ordered    predniSONE (DELTASONE) 20 MG tablet  Daily     08/02/17 0836    hydrOXYzine (ATARAX/VISTARIL) 25 MG tablet  Every 6 hours     08/02/17 0836       Jaynie Crumble, PA-C 08/02/17 1516    Doug Sou, MD 08/02/17 [phone]

## 2017-08-02 NOTE — ED Notes (Signed)
Pt remains anxious, Ativan given, pt states she is nauseated and wants something to eat. Writer explained she needs to given ativan in system before trying to eat. She is in agreement to wait. Lights turned down low, pt encouraged to relax and allow meds to work.

## 2017-08-02 NOTE — Discharge Instructions (Addendum)
Stop smoking.  Take prednisone as prescribed until all gone.  Use inhaler 2 puffs every 4 hours.  Please follow-up with your doctor for refill of your Klonopin.  Take Vistaril as prescribed as needed for anxiety.  Return if worsening.

## 2017-08-02 NOTE — ED Triage Notes (Signed)
Pt states she has really bad anxiety and is having a panic attack and is out of her medication (Klonopin)  Pt states her doctor will not be back in his office until next week  Pt states she "feels like I am going to die"

## 2017-08-05 MED FILL — SUBOXONE 8 MG-2 MG SL FILM: 8-2 | 14 days supply | Qty: 42 | Fill #0

## 2017-08-21 MED FILL — SUBOXONE 8 MG-2 MG SL FILM: 8-2 | 14 days supply | Qty: 42 | Fill #0

## 2017-09-04 MED FILL — SUBOXONE 8 MG-2 MG SL FILM: 8-2 | 28 days supply | Qty: 84 | Fill #0

## 2017-10-02 MED FILL — SUBOXONE 8 MG-2 MG SL FILM: 8-2 | 31 days supply | Qty: 95 | Fill #0

## 2017-10-30 MED FILL — SUBOXONE 8 MG-2 MG SL FILM: 8-2 | 28 days supply | Qty: 84 | Fill #0

## 2017-11-27 MED FILL — SUBOXONE 8 MG-2 MG SL FILM: 8-2 | 14 days supply | Qty: 42 | Fill #0

## 2017-12-11 MED FILL — SUBOXONE 8 MG-2 MG SL FILM: 8-2 | 14 days supply | Qty: 42 | Fill #0

## 2017-12-25 MED FILL — SUBOXONE 8 MG-2 MG SL FILM: 8-2 | 14 days supply | Qty: 42 | Fill #0

## 2018-01-08 MED FILL — SUBOXONE 8 MG-2 MG SL FILM: 8-2 | 26 days supply | Qty: 78 | Fill #0

## 2018-02-03 MED FILL — SUBOXONE 8 MG-2 MG SL FILM: 8-2 | 21 days supply | Qty: 63 | Fill #0

## 2018-02-18 ENCOUNTER — Emergency Department (HOSPITAL_COMMUNITY)
Admission: EM | Admit: 2018-02-18 | Discharge: 2018-02-18 | Disposition: A | Discharge status: Home / Self Care | Payer: Medicaid Other | Attending: Emergency Medicine | Admitting: Emergency Medicine

## 2018-02-18 ENCOUNTER — Other Ambulatory Visit: Payer: Self-pay

## 2018-02-18 ENCOUNTER — Encounter (HOSPITAL_COMMUNITY): Payer: Self-pay

## 2018-02-18 DIAGNOSIS — R197 Diarrhea, unspecified: Secondary | ICD-10-CM | POA: Diagnosis not present

## 2018-02-18 DIAGNOSIS — Z79899 Other long term (current) drug therapy: Secondary | ICD-10-CM | POA: Insufficient documentation

## 2018-02-18 DIAGNOSIS — F17210 Nicotine dependence, cigarettes, uncomplicated: Secondary | ICD-10-CM | POA: Insufficient documentation

## 2018-02-18 DIAGNOSIS — R112 Nausea with vomiting, unspecified: Secondary | ICD-10-CM | POA: Insufficient documentation

## 2018-02-18 LAB — CBC WITH DIFFERENTIAL/PLATELET
ABS IMMATURE GRANULOCYTES: 0.01 10*3/uL (ref 0.00–0.07)
BASOS PCT: 1 %
Basophils Absolute: 0.1 10*3/uL (ref 0.0–0.1)
EOS ABS: 0.1 10*3/uL (ref 0.0–0.5)
EOS PCT: 3 %
HCT: 42.5 % (ref 36.0–46.0)
Hemoglobin: 13.6 g/dL (ref 12.0–15.0)
IMMATURE GRANULOCYTES: 0 %
LYMPHS PCT: 24 %
Lymphs Abs: 1.2 10*3/uL (ref 0.7–4.0)
MCH: 31.9 pg (ref 26.0–34.0)
MCHC: 32 g/dL (ref 30.0–36.0)
MCV: 99.8 fL (ref 80.0–100.0)
MONO ABS: 0.3 10*3/uL (ref 0.1–1.0)
Monocytes Relative: 5 %
NRBC: 0 % (ref 0.0–0.2)
Neutro Abs: 3.3 10*3/uL (ref 1.7–7.7)
Neutrophils Relative %: 67 %
Platelets: 361 10*3/uL (ref 150–400)
RBC: 4.26 MIL/uL (ref 3.87–5.11)
RDW: 13.2 % (ref 11.5–15.5)
WBC: 5 10*3/uL (ref 4.0–10.5)

## 2018-02-18 LAB — COMPREHENSIVE METABOLIC PANEL
ALT: 14 U/L (ref 0–44)
AST: 17 U/L (ref 15–41)
Albumin: 3.9 g/dL (ref 3.5–5.0)
Alkaline Phosphatase: 47 U/L (ref 38–126)
Anion gap: 8 (ref 5–15)
BILIRUBIN TOTAL: 0.6 mg/dL (ref 0.3–1.2)
BUN: 8 mg/dL (ref 6–20)
CALCIUM: 8.9 mg/dL (ref 8.9–10.3)
CO2: 29 mmol/L (ref 22–32)
CREATININE: 0.74 mg/dL (ref 0.44–1.00)
Chloride: 104 mmol/L (ref 98–111)
GFR calc Af Amer: >60 mL/min (ref >60)
GFR calc non Af Amer: >60 mL/min (ref >60)
GLUCOSE: 100 mg/dL — AB (ref 70–99)
Potassium: 3.9 mmol/L (ref 3.5–5.1)
Sodium: 141 mmol/L (ref 135–145)
Total Protein: 7 g/dL (ref 6.5–8.1)

## 2018-02-18 LAB — URINALYSIS, ROUTINE W REFLEX MICROSCOPIC
Bilirubin Urine: NEGATIVE
Glucose, UA: NEGATIVE mg/dL
Hgb urine dipstick: NEGATIVE
Ketones, ur: NEGATIVE mg/dL
Leukocytes, UA: NEGATIVE
NITRITE: NEGATIVE
PH: 6 (ref 5.0–8.0)
Protein, ur: NEGATIVE mg/dL
SPECIFIC GRAVITY, URINE: 1.003 — AB (ref 1.005–1.030)

## 2018-02-18 LAB — LIPASE, BLOOD: LIPASE: 19 U/L (ref 11–51)

## 2018-02-18 MED ORDER — ONDANSETRON HCL 4 MG/2ML IJ SOLN
4.0000 mg | Freq: Once | INTRAMUSCULAR | Status: AC
  Administered 2018-02-18: 4 mg via INTRAVENOUS
  Filled 2018-02-18: qty 2

## 2018-02-18 MED ORDER — SODIUM CHLORIDE 0.9 % IV BOLUS
1000.0000 mL | Freq: Once | INTRAVENOUS | Status: AC
  Administered 2018-02-18: 1000 mL via INTRAVENOUS

## 2018-02-18 MED ORDER — ONDANSETRON HCL 4 MG PO TABS: 4.0000 mg | ORAL_TABLET | Freq: Three times a day (TID) | ORAL | 0 refills | Status: AC | PRN

## 2018-02-18 NOTE — ED Provider Notes (Signed)
[facility]-EMERGENCY DEPT Provider Note   CSN: [id number] Arrival date & time: 02/18/18  0915     History   Chief Complaint Chief Complaint  Patient presents with  . Emesis  . Diarrhea    HPI Terri White is a 52 y.o. female.  The history is provided by the patient. No language interpreter was used.  Emesis   Associated symptoms include diarrhea.  Diarrhea   Associated symptoms include vomiting.   Terri White is a 52 y.o. female who presents to the Emergency Department complaining of vomiting, diarrhea. She presents complaining of nausea, vomiting, diarrhea that began yesterday. She reports 6 to 7 episodes of nonbloody non-bilious emesis with 5 to 6 episodes of diarrhea. She has mild associated abdominal discomfort. No known sick contacts or bad food exposures. She states that she thinks her children may have brought home a bug, but they are not having any similar symptoms. She has a history of prior C-section, no additional abdominal surgeries. She takes Suboxone. Denies alcohol or drug use. Symptoms are moderate and constant nature. Past Medical History:  Diagnosis Date  . ADD (attention deficit disorder)   . Anxiety   . Arthritis   . Back pain   . Nerve damage     There are no active problems to display for this patient.   Past Surgical History:  Procedure Laterality Date  . CESAREAN SECTION    . CESAREAN SECTION  2002 and 2003  . KNEE SURGERY    . KNEE SURGERY     R knee     OB History    Gravida      Para      Term      Preterm      AB      Living  2     SAB      TAB      Ectopic      Multiple      Live Births               Home Medications    Prior to Admission medications   Medication Sig Start Date End Date Taking? Authorizing Provider  amphetamine-dextroamphetamine (ADDERALL) 15 MG tablet Take 1 tablet by mouth 2 (two) times daily. Patient not taking: Reported on 02/18/2018 11/12/16 11/12/17  Arfeen, Phillips Grout, MD  clonazePAM (KLONOPIN) 0.5 MG tablet Take 1-2 tab daily Patient not taking: Reported on 02/18/2018 10/11/16   Arfeen, Phillips Grout, MD  hydrOXYzine (ATARAX/VISTARIL) 25 MG tablet Take 1 tablet (25 mg total) by mouth every 6 (six) hours. Patient not taking: Reported on 02/18/2018 08/02/17   Jaynie Crumble, PA-C  ondansetron (ZOFRAN) 4 MG tablet Take 1 tablet (4 mg total) by mouth every 8 (eight) hours as needed for nausea or vomiting. 02/18/18   Tilden Fossa, MD    Family History Family History  Problem Relation Age of Onset  . Osteoarthritis Mother   . Rheum arthritis Father   . ADD / ADHD Sister     Social History Social History   Tobacco Use  . Smoking status: Current Some Day Smoker    Packs/day: 0.50    Years: 10.00    Pack years: 5.00    Types: Cigarettes  . Smokeless tobacco: Never Used  Substance Use Topics  . Alcohol use: No    Alcohol/week: 0.0 standard drinks  . Drug use: No    Comment: seldom     Allergies   Diclofenac sodium; Pyridium [phenazopyridine  hcl]; Sulfa antibiotics; Toradol [ketorolac tromethamine]; and Tramadol   Review of Systems Review of Systems  Gastrointestinal: Positive for diarrhea and vomiting.  All other systems reviewed and are negative.    Physical Exam Updated Vital Signs BP (!) 108/54 (BP Location: Right Arm)   Pulse 72   Temp 97.6 F (36.4 C) (Oral)   Resp 16   Ht 5\' 2"  (1.575 m)   Wt 74.8 kg   LMP 10/28/2014 (Approximate)   SpO2 100%   BMI 30.18 kg/m   Physical Exam  Constitutional: She is oriented to person, place, and time. She appears well-developed and well-nourished.  HENT:  Head: Normocephalic and atraumatic.  Cardiovascular: Normal rate and regular rhythm.  No murmur heard. Pulmonary/Chest: Effort normal and breath sounds normal. No respiratory distress.  Abdominal: Soft. There is no rebound and no guarding.  Mild generalized abdominal tenderness.  Musculoskeletal: She exhibits no edema or  tenderness.  Neurological: She is alert and oriented to person, place, and time.  Skin: Skin is warm and dry.  Psychiatric: She has a normal mood and affect. Her behavior is normal.  Nursing note and vitals reviewed.    ED Treatments / Results  Labs (all labs ordered are listed, but only abnormal results are displayed) Labs Reviewed  COMPREHENSIVE METABOLIC PANEL - Abnormal; Notable for the following components:      Result Value   Glucose, Bld 100 (*)    All other components within normal limits  URINALYSIS, ROUTINE W REFLEX MICROSCOPIC - Abnormal; Notable for the following components:   Specific Gravity, Urine 1.003 (*)    All other components within normal limits  CBC WITH DIFFERENTIAL/PLATELET  LIPASE, BLOOD    EKG None  Radiology No results found.  Procedures Procedures (including critical care time)  Medications Ordered in ED Medications  sodium chloride 0.9 % bolus 1,000 mL (1,000 mLs Intravenous New Bag/Given (Non-Interop) 02/18/18 1020)  ondansetron (ZOFRAN) injection 4 mg (4 mg Intravenous Given 02/18/18 1020)     Initial Impression / Assessment and Plan / ED Course  I have reviewed the triage vital signs and the nursing notes.  Pertinent labs & imaging results that were available during my care of the patient were reviewed by me and considered in my medical decision making (see chart for details).    Patient here for evaluation of nausea, vomiting, diarrhea. No reports of abdominal tenderness, mild tenderness on examination without peritoneal findings. Following treatment with IV fluids and antiemetics she is feeling improved. Current presentation is not consistent with acute appendicitis, acute cholecystitis, acute abdomen. Plan to discharge home with antiemetics, oral fluid hydration. Discussed with patient importance of returning to the emergency department for repeat abdominal examination if she does develop abdominal pain.  Final Clinical Impressions(s)  / ED Diagnoses   Final diagnoses:  Nausea vomiting and diarrhea    ED Discharge Orders         Ordered    ondansetron (ZOFRAN) 4 MG tablet  Every 8 hours PRN     02/18/18 1156           Tilden Fossaees, Janaysha Depaulo, MD 02/18/18 1159

## 2018-02-18 NOTE — ED Notes (Signed)
Pt kept fulids down at this time

## 2018-02-18 NOTE — ED Notes (Signed)
Pt provided with Sprite

## 2018-02-18 NOTE — Discharge Instructions (Addendum)
Get rechecked immediately if you develop abdominal pain or new concerning symptoms.

## 2018-02-18 NOTE — ED Triage Notes (Signed)
Pt arrives via POV from home. Pt reports N/V/D since last night. Pt denies abd pain. Pt denies blood in stool or emesis. Pt reports she thinks her children may "have brought home a bug".

## 2018-02-24 MED FILL — SUBOXONE 8 MG-2 MG SL FILM: 8-2 | 16 days supply | Qty: 48 | Fill #0

## 2018-03-12 MED FILL — SUBOXONE 8 MG-2 MG SL FILM: 8-2 | 28 days supply | Qty: 84 | Fill #0

## 2018-04-09 MED FILL — SUBOXONE 8 MG-2 MG SL FILM: 8-2 | 14 days supply | Qty: 42 | Fill #0

## 2018-04-23 MED FILL — SUBOXONE 8 MG-2 MG SL FILM: 8-2 | 14 days supply | Qty: 42 | Fill #0

## 2018-05-07 MED FILL — SUBOXONE 8 MG-2 MG SL FILM: 8-2 | 14 days supply | Qty: 42 | Fill #0

## 2018-05-21 MED FILL — SUBOXONE 8 MG-2 MG SL FILM: 8-2 | 14 days supply | Qty: 42 | Fill #0

## 2018-06-04 MED FILL — SUBOXONE 8 MG-2 MG SL FILM: 8-2 | 14 days supply | Qty: 42 | Fill #0

## 2018-06-18 MED FILL — SUBOXONE 8 MG-2 MG SL FILM: 8-2 | 14 days supply | Qty: 42 | Fill #0

## 2018-07-02 MED FILL — SUBOXONE 8 MG-2 MG SL FILM: 8-2 | 14 days supply | Qty: 42 | Fill #0

## 2018-07-16 MED FILL — SUBOXONE 8 MG-2 MG SL FILM: 8-2 | 14 days supply | Qty: 42 | Fill #0

## 2018-07-30 MED FILL — SUBOXONE 8 MG-2 MG SL FILM: 8-2 | 14 days supply | Qty: 42 | Fill #0

## 2018-08-13 MED FILL — SUBOXONE 8 MG-2 MG SL FILM: 8-2 | 14 days supply | Qty: 42 | Fill #0

## 2018-08-27 MED FILL — SUBOXONE 8 MG-2 MG SL FILM: 8-2 | 21 days supply | Qty: 63 | Fill #0

## 2018-09-17 MED FILL — SUBOXONE 8 MG-2 MG SL FILM: 8-2 | 21 days supply | Qty: 63 | Fill #0

## 2018-10-08 MED FILL — SUBOXONE 8 MG-2 MG SL FILM: 8-2 | 14 days supply | Qty: 42 | Fill #0

## 2018-10-22 MED FILL — SUBOXONE 8 MG-2 MG SL FILM: 8-2 | 14 days supply | Qty: 42 | Fill #0

## 2018-11-05 MED FILL — SUBOXONE 8 MG-2 MG SL FILM: 8-2 | 14 days supply | Qty: 42 | Fill #0

## 2018-11-19 MED FILL — SUBOXONE 8 MG-2 MG SL FILM: 8-2 | 14 days supply | Qty: 42 | Fill #0

## 2018-12-03 MED FILL — SUBOXONE 8 MG-2 MG SL FILM: 8-2 | 14 days supply | Qty: 42 | Fill #0

## 2018-12-17 MED FILL — SUBOXONE 8 MG-2 MG SL FILM: 8-2 | 14 days supply | Qty: 42 | Fill #0

## 2018-12-31 MED FILL — SUBOXONE 8 MG-2 MG SL FILM: 8-2 | 21 days supply | Qty: 63 | Fill #0

## 2019-01-21 MED FILL — SUBOXONE 8 MG-2 MG SL FILM: 8-2 | 12 days supply | Qty: 36 | Fill #0

## 2019-02-02 MED FILL — SUBOXONE 8 MG-2 MG SL FILM: 8-2 | 14 days supply | Qty: 42 | Fill #0

## 2019-02-16 MED FILL — SUBOXONE 8 MG-2 MG SL FILM: 8-2 | 14 days supply | Qty: 42 | Fill #0

## 2019-03-02 MED FILL — SUBOXONE 8 MG-2 MG SL FILM: 8-2 | 14 days supply | Qty: 42 | Fill #0

## 2019-03-16 MED FILL — SUBOXONE 8 MG-2 MG SL FILM: 8-2 | 14 days supply | Qty: 42 | Fill #0

## 2019-03-30 MED FILL — SUBOXONE 8 MG-2 MG SL FILM: 8-2 | 14 days supply | Qty: 42 | Fill #0

## 2019-04-14 MED FILL — SUBOXONE 8 MG-2 MG SL FILM: 8-2 | 14 days supply | Qty: 42 | Fill #0

## 2019-04-14 MED FILL — NARCAN 4 MG NASAL SPRAY: 4 | 2 days supply | Qty: 2 | Fill #0

## 2019-05-03 MED FILL — SUBOXONE 8 MG-2 MG SL FILM: 8-2 | 30 days supply | Qty: 90 | Fill #0

## 2019-05-03 MED FILL — VIT D2 1.25 MG (50,000 UNIT: 1.25 MG | 84 days supply | Qty: 12 | Fill #0

## 2019-06-14 MED FILL — SUBOXONE 8 MG-2 MG SL FILM: 8-2 | 15 days supply | Qty: 45 | Fill #0

## 2019-06-28 MED FILL — SUBOXONE 8 MG-2 MG SL FILM: 8-2 | 15 days supply | Qty: 45 | Fill #0

## 2019-07-12 MED FILL — SUBOXONE 8 MG-2 MG SL FILM: 8-2 | 15 days supply | Qty: 45 | Fill #0

## 2019-07-26 MED FILL — SUBOXONE 8 MG-2 MG SL FILM: 8-2 | 15 days supply | Qty: 45 | Fill #0

## 2019-08-10 MED FILL — SUBOXONE 8 MG-2 MG SL FILM: 8-2 | 15 days supply | Qty: 45 | Fill #0

## 2019-08-23 MED FILL — SUBOXONE 8 MG-2 MG SL FILM: 8-2 | 15 days supply | Qty: 45 | Fill #0

## 2019-09-07 MED FILL — SUBOXONE 8 MG-2 MG SL FILM: 8-2 | 15 days supply | Qty: 45 | Fill #0

## 2019-09-20 MED FILL — SUBOXONE 8 MG-2 MG SL FILM: 8-2 | 15 days supply | Qty: 45 | Fill #0

## 2019-10-05 MED FILL — VIT D2 1.25 MG (50,000 UNIT: 1.25 MG | 84 days supply | Qty: 12 | Fill #0

## 2019-10-05 MED FILL — SUBOXONE 8 MG-2 MG SL FILM: 8-2 | 15 days supply | Qty: 45 | Fill #0

## 2019-10-18 MED FILL — SUBOXONE 8 MG-2 MG SL FILM: 8-2 | 15 days supply | Qty: 45 | Fill #0

## 2019-11-01 MED FILL — SUBOXONE 8 MG-2 MG SL FILM: 8-2 | 15 days supply | Qty: 45 | Fill #0

## 2019-11-13 MED FILL — SUBOXONE 8 MG-2 MG SL FILM: 8-2 | 30 days supply | Qty: 90 | Fill #0

## 2019-12-02 MED FILL — AMPHETAMINE-DEXTROAMPHETAMI: 30 | 30 days supply | Qty: 60 | Fill #0

## 2021-05-23 ENCOUNTER — Other Ambulatory Visit (HOSPITAL_COMMUNITY): Payer: Self-pay

## 2021-05-23 MED ORDER — LAMOTRIGINE 100 MG PO TABS
ORAL_TABLET | ORAL | 2 refills | Status: AC
  Filled 2021-05-23: qty 60, 30d supply, fill #0

## 2021-05-23 MED ORDER — BUPRENORPHINE HCL-NALOXONE HCL 8-2 MG SL FILM
ORAL_FILM | SUBLINGUAL | 0 refills | Status: DC
  Filled 2021-05-23: qty 90, 30d supply, fill #0

## 2021-05-23 MED ORDER — CLONAZEPAM 0.5 MG PO TABS
ORAL_TABLET | ORAL | 2 refills | Status: AC
  Filled 2021-05-23 (×2): qty 30, 30d supply, fill #0
  Filled 2021-06-28: qty 30, 30d supply, fill #1
  Filled 2021-09-05: qty 30, 30d supply, fill #2

## 2021-05-28 ENCOUNTER — Other Ambulatory Visit (HOSPITAL_COMMUNITY): Payer: Self-pay

## 2021-06-28 ENCOUNTER — Other Ambulatory Visit (HOSPITAL_COMMUNITY): Payer: Self-pay

## 2021-07-19 ENCOUNTER — Other Ambulatory Visit (HOSPITAL_COMMUNITY): Payer: Self-pay

## 2021-07-26 ENCOUNTER — Other Ambulatory Visit (HOSPITAL_COMMUNITY): Payer: Self-pay

## 2021-07-26 MED ORDER — LAMOTRIGINE 100 MG PO TABS
100.0000 mg | ORAL_TABLET | Freq: Two times a day (BID) | ORAL | 0 refills | Status: AC
  Filled 2021-07-26: qty 60, 30d supply, fill #0

## 2021-07-26 MED ORDER — BUPRENORPHINE HCL-NALOXONE HCL 8-2 MG SL FILM
ORAL_FILM | SUBLINGUAL | 0 refills | Status: DC
  Filled 2021-07-26: qty 90, 30d supply, fill #0

## 2021-07-26 MED ORDER — CLONAZEPAM 0.5 MG PO TABS
ORAL_TABLET | ORAL | 0 refills | Status: AC
  Filled 2021-07-26 – 2021-08-08 (×2): qty 30, 30d supply, fill #0

## 2021-07-26 MED ORDER — AMPHETAMINE-DEXTROAMPHETAMINE 30 MG PO TABS
ORAL_TABLET | ORAL | 0 refills | Status: DC
  Filled 2021-07-26 – 2021-08-20 (×2): qty 60, 30d supply, fill #0

## 2021-07-27 ENCOUNTER — Other Ambulatory Visit (HOSPITAL_COMMUNITY): Payer: Self-pay

## 2021-08-08 ENCOUNTER — Other Ambulatory Visit (HOSPITAL_COMMUNITY): Payer: Self-pay

## 2021-08-14 ENCOUNTER — Other Ambulatory Visit (HOSPITAL_COMMUNITY): Payer: Self-pay

## 2021-08-20 ENCOUNTER — Other Ambulatory Visit (HOSPITAL_COMMUNITY): Payer: Self-pay

## 2021-08-22 ENCOUNTER — Other Ambulatory Visit (HOSPITAL_COMMUNITY): Payer: Self-pay

## 2021-08-22 MED ORDER — LAMOTRIGINE 100 MG PO TABS
100.0000 mg | ORAL_TABLET | Freq: Two times a day (BID) | ORAL | 1 refills | Status: AC
  Filled 2021-08-22: qty 60, 30d supply, fill #0

## 2021-08-22 MED ORDER — BUPRENORPHINE HCL-NALOXONE HCL 8-2 MG SL FILM
ORAL_FILM | SUBLINGUAL | 1 refills | Status: DC
  Filled 2021-08-22: qty 90, 30d supply, fill #0
  Filled 2021-09-19: qty 90, 30d supply, fill #1

## 2021-09-05 ENCOUNTER — Other Ambulatory Visit (HOSPITAL_COMMUNITY): Payer: Self-pay

## 2021-09-07 ENCOUNTER — Emergency Department (HOSPITAL_COMMUNITY): Payer: Medicaid Other

## 2021-09-07 ENCOUNTER — Emergency Department (HOSPITAL_COMMUNITY)
Admission: EM | Admit: 2021-09-07 | Discharge: 2021-09-07 | Disposition: A | Discharge status: Home / Self Care | Payer: Medicaid Other | Attending: Emergency Medicine | Admitting: Emergency Medicine

## 2021-09-07 DIAGNOSIS — S80211A Abrasion, right knee, initial encounter: Secondary | ICD-10-CM | POA: Diagnosis not present

## 2021-09-07 DIAGNOSIS — M25561 Pain in right knee: Secondary | ICD-10-CM

## 2021-09-07 DIAGNOSIS — S4992XA Unspecified injury of left shoulder and upper arm, initial encounter: Secondary | ICD-10-CM | POA: Diagnosis present

## 2021-09-07 DIAGNOSIS — T148XXA Other injury of unspecified body region, initial encounter: Secondary | ICD-10-CM

## 2021-09-07 DIAGNOSIS — W01198A Fall on same level from slipping, tripping and stumbling with subsequent striking against other object, initial encounter: Secondary | ICD-10-CM | POA: Diagnosis not present

## 2021-09-07 DIAGNOSIS — S40022A Contusion of left upper arm, initial encounter: Secondary | ICD-10-CM | POA: Diagnosis not present

## 2021-09-07 MED ORDER — HYDROCODONE-ACETAMINOPHEN 5-325 MG PO TABS
1.0000 | ORAL_TABLET | Freq: Once | ORAL | Status: AC
  Administered 2021-09-07: 1 via ORAL
  Filled 2021-09-07: qty 1

## 2021-09-07 MED ORDER — ONDANSETRON 4 MG PO TBDP
4.0000 mg | ORAL_TABLET | Freq: Once | ORAL | Status: AC
  Administered 2021-09-07: 4 mg via ORAL
  Filled 2021-09-07: qty 1

## 2021-09-07 NOTE — ED Notes (Signed)
Pt transported to Xray. 

## 2021-09-07 NOTE — Discharge Instructions (Addendum)
Take tylenol or ibuprofen for pain. Ice and elevate your knee.  Contact a health care provider if: Your knee pain continues, changes, or gets worse. You have a fever along with knee pain. Your knee feels warm to the touch or is red. Your knee buckles or locks up. Get help right away if: Your knee swells, and the swelling becomes worse. You cannot move your knee. You have severe pain in your knee that cannot be managed with pain medicine.

## 2021-09-07 NOTE — ED Provider Notes (Signed)
[facility]-EMERGENCY DEPT Provider Note   CSN: [id number] Arrival date & time: 09/07/21  0015     History {Add pertinent medical, surgical, social history, OB history to HPI:1} Chief Complaint  Patient presents with   Fall   Knee Pain    Terri White is a 56 y.o. female who presents emergency department with a chief complaint of fall.  Patient states that she was out while outside watering when she lost her footing, fell directly onto her right knee and then hit her arm and back as she rolled onto a curb.  Patient had no issues with her knee prior to arrival but is now having pain, swelling and difficulty ambulating.  She applied ice and took Tylenol prior to arrival.  She denies numbness or tingling.   Fall  Knee Pain      Home Medications Prior to Admission medications   Medication Sig Start Date End Date Taking? Authorizing Provider  amphetamine-dextroamphetamine (ADDERALL) 15 MG tablet Take 1 tablet by mouth 2 (two) times daily. Patient not taking: Reported on 02/18/2018 11/12/16 11/12/17  Arfeen, Phillips Grout, MD  amphetamine-dextroamphetamine (ADDERALL) 30 MG tablet Take 1 tablet by mouth 2 times daily as needed 07/26/21     Buprenorphine HCl-Naloxone HCl 8-2 MG FILM Place 1 film under tongue 3 times a day as needed 08/22/21   Ameh, Nyra Market, FNP  clonazePAM (KLONOPIN) 0.5 MG tablet Take 1-2 tab daily Patient not taking: Reported on 02/18/2018 10/11/16   Arfeen, Phillips Grout, MD  clonazePAM (KLONOPIN) 0.5 MG tablet Take 1 tablet by mouth once daily, as needed 05/23/21     clonazePAM (KLONOPIN) 0.5 MG tablet Take 1 tablet by mouth once daily as needed 07/26/21     hydrOXYzine (ATARAX/VISTARIL) 25 MG tablet Take 1 tablet (25 mg total) by mouth every 6 (six) hours. Patient not taking: Reported on 02/18/2018 08/02/17   Jaynie Crumble, PA-C  lamoTRIgine (LAMICTAL) 100 MG tablet Take 1 (one)  tablet by mouth two times daily 05/23/21     lamoTRIgine (LAMICTAL)  100 MG tablet Take 1 tablet by mouth 2 times daily 07/26/21     lamoTRIgine (LAMICTAL) 100 MG tablet Take 1 tablet by mouth 2 times daily 08/22/21     ondansetron (ZOFRAN) 4 MG tablet Take 1 tablet (4 mg total) by mouth every 8 (eight) hours as needed for nausea or vomiting. 02/18/18   Tilden Fossa, MD      Allergies    Diclofenac sodium, Pyridium [phenazopyridine hcl], Sulfa antibiotics, Toradol [ketorolac tromethamine], and Tramadol    Review of Systems   Review of Systems  Physical Exam Updated Vital Signs BP 120/73 (BP Location: Left Arm)   Pulse (!) 103   Temp 98.1 F (36.7 C) (Oral)   Resp 18   Ht 5\' 2"  (1.575 m)   Wt 75.8 kg   LMP 10/28/2014 (Approximate)   SpO2 94%   BMI 30.54 kg/m  Physical Exam Vitals and nursing note reviewed.  Constitutional:      General: She is not in acute distress.    Appearance: She is well-developed. She is not diaphoretic.  HENT:     Head: Normocephalic and atraumatic.     Right Ear: External ear normal.     Left Ear: External ear normal.     Nose: Nose normal.     Mouth/Throat:     Mouth: Mucous membranes are moist.  Eyes:     General: No scleral icterus.    Conjunctiva/sclera: Conjunctivae  normal.  Cardiovascular:     Rate and Rhythm: Normal rate and regular rhythm.     Heart sounds: Normal heart sounds. No murmur heard.    No friction rub. No gallop.  Pulmonary:     Effort: Pulmonary effort is normal. No respiratory distress.     Breath sounds: Normal breath sounds.  Abdominal:     General: Bowel sounds are normal. There is no distension.     Palpations: Abdomen is soft. There is no mass.     Tenderness: There is no abdominal tenderness. There is no guarding.  Musculoskeletal:     Cervical back: Normal range of motion.     Right lower leg: No edema.     Comments: Right knee with abrasion, swelling, tenderness along the joint line, range of motion limited due to pain.  Ligaments stable. Bruising on the medial left arm.   Skin:    General: Skin is warm and dry.  Neurological:     Mental Status: She is alert and oriented to person, place, and time.  Psychiatric:        Behavior: Behavior normal.     ED Results / Procedures / Treatments   Labs (all labs ordered are listed, but only abnormal results are displayed) Labs Reviewed - No data to display  EKG None  Radiology No results found.  Procedures Procedures    Medications Ordered in ED Medications  HYDROcodone-acetaminophen (NORCO/VICODIN) 5-325 MG per tablet 1 tablet (has no administration in time range)  ondansetron (ZOFRAN-ODT) disintegrating tablet 4 mg (has no administration in time range)    ED Course/ Medical Decision Making/ A&P                           Medical Decision Making Amount and/or Complexity of Data Reviewed Radiology: ordered.  Risk Prescription drug management.  Patient here with fall, I personally visualized right knee x-ray which shows no acute fractures.  Patient has a knee sleeve.  Patient will be discharged with crutches, pain medication, outpatient follow-up with orthopedics and return precautions.  {Document critical care time when appropriate:1} {Document review of labs and clinical decision tools ie heart score, Chads2Vasc2 etc:1}  {Document your independent review of radiology images, and any outside records:1} {Document your discussion with family members, caretakers, and with consultants:1} {Document social determinants of health affecting pt's care:1} {Document your decision making why or why not admission, treatments were needed:1} Final Clinical Impression(s) / ED Diagnoses Final diagnoses:  None    Rx / DC Orders ED Discharge Orders     None

## 2021-09-07 NOTE — ED Triage Notes (Signed)
Pt fell on her knee after tripping on a curb, R knee. Pt states that she hit her head, her L arm and hand. No LOC. Pt also c/o R sided back pain.

## 2021-09-14 ENCOUNTER — Other Ambulatory Visit (HOSPITAL_COMMUNITY): Payer: Self-pay

## 2021-09-15 ENCOUNTER — Other Ambulatory Visit (HOSPITAL_COMMUNITY): Payer: Self-pay

## 2021-09-19 ENCOUNTER — Other Ambulatory Visit (HOSPITAL_COMMUNITY): Payer: Self-pay

## 2021-10-22 ENCOUNTER — Other Ambulatory Visit (HOSPITAL_COMMUNITY): Payer: Self-pay

## 2021-10-30 ENCOUNTER — Other Ambulatory Visit (HOSPITAL_COMMUNITY): Payer: Self-pay

## 2021-10-30 MED ORDER — ERGOCALCIFEROL 1.25 MG (50000 UT) PO CAPS
ORAL_CAPSULE | ORAL | 1 refills | Status: AC
  Filled 2021-10-30: qty 12, 84d supply, fill #0

## 2021-10-30 MED ORDER — ALPRAZOLAM 0.5 MG PO TABS
ORAL_TABLET | ORAL | 0 refills | Status: AC
  Filled 2021-10-30: qty 60, 30d supply, fill #0

## 2021-10-30 MED ORDER — BUPRENORPHINE HCL-NALOXONE HCL 8-2 MG SL FILM
ORAL_FILM | SUBLINGUAL | 0 refills | Status: DC
  Filled 2021-10-30: qty 30, 10d supply, fill #0
  Filled 2021-11-07: qty 60, 20d supply, fill #1

## 2021-10-30 MED ORDER — AMPHETAMINE-DEXTROAMPHETAMINE 30 MG PO TABS
ORAL_TABLET | ORAL | 0 refills | Status: DC
  Filled 2021-10-30 – 2021-11-09 (×2): qty 60, 30d supply, fill #0

## 2021-11-05 ENCOUNTER — Other Ambulatory Visit (HOSPITAL_COMMUNITY): Payer: Self-pay

## 2021-11-08 ENCOUNTER — Other Ambulatory Visit (HOSPITAL_COMMUNITY): Payer: Self-pay

## 2021-11-09 ENCOUNTER — Other Ambulatory Visit (HOSPITAL_COMMUNITY): Payer: Self-pay

## 2021-11-30 ENCOUNTER — Other Ambulatory Visit (HOSPITAL_COMMUNITY): Payer: Self-pay

## 2021-12-14 ENCOUNTER — Other Ambulatory Visit (HOSPITAL_COMMUNITY): Payer: Self-pay

## 2021-12-14 MED ORDER — AMPHETAMINE-DEXTROAMPHETAMINE 30 MG PO TABS
30.0000 mg | ORAL_TABLET | Freq: Two times a day (BID) | ORAL | 0 refills | Status: AC | PRN
  Filled 2021-12-14 – 2022-01-03 (×7): qty 60, 30d supply, fill #0

## 2021-12-14 MED ORDER — ALPRAZOLAM 0.5 MG PO TABS
0.5000 mg | ORAL_TABLET | Freq: Two times a day (BID) | ORAL | 0 refills | Status: AC | PRN
  Filled 2021-12-14 – 2021-12-28 (×4): qty 60, 30d supply, fill #0

## 2021-12-14 MED ORDER — BUPRENORPHINE HCL-NALOXONE HCL 8-2 MG SL FILM
1.0000 | ORAL_FILM | Freq: Three times a day (TID) | SUBLINGUAL | 0 refills | Status: AC | PRN
  Filled 2021-12-14 – 2021-12-17 (×5): qty 90, 30d supply, fill #0

## 2021-12-15 ENCOUNTER — Other Ambulatory Visit (HOSPITAL_COMMUNITY): Payer: Self-pay

## 2021-12-17 ENCOUNTER — Other Ambulatory Visit (HOSPITAL_COMMUNITY): Payer: Self-pay

## 2021-12-26 ENCOUNTER — Other Ambulatory Visit (HOSPITAL_COMMUNITY): Payer: Self-pay

## 2021-12-28 ENCOUNTER — Other Ambulatory Visit (HOSPITAL_COMMUNITY): Payer: Self-pay

## 2021-12-31 ENCOUNTER — Other Ambulatory Visit (HOSPITAL_COMMUNITY): Payer: Self-pay

## 2022-01-03 ENCOUNTER — Other Ambulatory Visit (HOSPITAL_COMMUNITY): Payer: Self-pay

## 2022-04-16 ENCOUNTER — Other Ambulatory Visit (HOSPITAL_COMMUNITY): Payer: Self-pay

## 2022-04-16 MED ORDER — SUBOXONE 8-2 MG SL FILM
ORAL_FILM | SUBLINGUAL | 0 refills | Status: AC
  Filled 2022-04-16: qty 90, 30d supply, fill #0

## 2022-04-17 ENCOUNTER — Other Ambulatory Visit (HOSPITAL_COMMUNITY): Payer: Self-pay

## 2022-04-17 MED ORDER — AMPHETAMINE-DEXTROAMPHETAMINE 30 MG PO TABS
30.0000 mg | ORAL_TABLET | Freq: Two times a day (BID) | ORAL | 0 refills | Status: AC | PRN
  Filled 2022-04-17 (×2): qty 60, 30d supply, fill #0

## 2022-11-27 ENCOUNTER — Other Ambulatory Visit (HOSPITAL_COMMUNITY): Payer: Self-pay
